# Patient Record
Sex: Male | Born: 1940
Health system: Southern US, Community
[De-identification: ages and names within clinical notes are randomized; demographics above are authoritative.]

## PROBLEM LIST (undated history)

## (undated) DIAGNOSIS — R011 Cardiac murmur, unspecified: Secondary | ICD-10-CM

## (undated) DIAGNOSIS — I351 Nonrheumatic aortic (valve) insufficiency: Secondary | ICD-10-CM

## (undated) DIAGNOSIS — Z86711 Personal history of pulmonary embolism: Secondary | ICD-10-CM

## (undated) DIAGNOSIS — I10 Essential (primary) hypertension: Secondary | ICD-10-CM

## (undated) DIAGNOSIS — I7789 Other specified disorders of arteries and arterioles: Secondary | ICD-10-CM

## (undated) DIAGNOSIS — Z9289 Personal history of other medical treatment: Secondary | ICD-10-CM

## (undated) DIAGNOSIS — M1990 Unspecified osteoarthritis, unspecified site: Secondary | ICD-10-CM

## (undated) DIAGNOSIS — E7800 Pure hypercholesterolemia, unspecified: Secondary | ICD-10-CM

## (undated) HISTORY — DX: Personal history of pulmonary embolism: Z86.711

## (undated) HISTORY — DX: Personal history of other medical treatment: Z92.89

## (undated) HISTORY — DX: Nonrheumatic aortic (valve) insufficiency: I35.1

## (undated) HISTORY — PX: MULTIPLE TOOTH EXTRACTIONS: SHX2053

## (undated) HISTORY — DX: Essential (primary) hypertension: I10

## (undated) HISTORY — DX: Other specified disorders of arteries and arterioles: I77.89

## (undated) HISTORY — PX: CARDIAC CATHETERIZATION: SHX172

## (undated) HISTORY — DX: Pure hypercholesterolemia, unspecified: E78.00

---

## 2012-12-07 ENCOUNTER — Other Ambulatory Visit (HOSPITAL_COMMUNITY): Payer: Self-pay

## 2012-12-08 ENCOUNTER — Encounter (HOSPITAL_COMMUNITY): Payer: Self-pay | Admitting: Interventional Cardiology

## 2012-12-10 ENCOUNTER — Encounter: Payer: Self-pay | Admitting: Interventional Cardiology

## 2012-12-10 ENCOUNTER — Encounter: Payer: Self-pay | Admitting: *Deleted

## 2012-12-14 ENCOUNTER — Ambulatory Visit: Payer: Self-pay | Admitting: Interventional Cardiology

## 2012-12-29 ENCOUNTER — Encounter: Payer: Self-pay | Admitting: Interventional Cardiology

## 2012-12-29 ENCOUNTER — Ambulatory Visit (INDEPENDENT_AMBULATORY_CARE_PROVIDER_SITE_OTHER): Payer: Medicare Other | Admitting: Interventional Cardiology

## 2012-12-29 VITALS — BP 138/70 | HR 75 | Ht 69.0 in | Wt 193.0 lb

## 2012-12-29 DIAGNOSIS — I7789 Other specified disorders of arteries and arterioles: Secondary | ICD-10-CM

## 2012-12-29 DIAGNOSIS — I359 Nonrheumatic aortic valve disorder, unspecified: Secondary | ICD-10-CM

## 2012-12-29 DIAGNOSIS — I10 Essential (primary) hypertension: Secondary | ICD-10-CM

## 2012-12-29 LAB — CK: Total CK: 195 U/L (ref 7–232)

## 2012-12-29 NOTE — Progress Notes (Signed)
Patient ID: Frederick Burnett, male   DOB: [birth year], 72 y.o.   MRN: [id number]    [address], [address] [place], [place]  [address] Phone: [phone] Fax:  [phone]  Date:  12/29/2012   ID:  Frederick Burnett, Frederick Burnett 07-20-1940, MRN [id number]  PCP:  No primary provider on file.   ASSESSMENT:  1. Moderate to severe aortic regurgitation, presumed secondary to bicuspid aortic valve. No heart failure symptoms 2. Aortic root enlargement secondary to aortic regurgitation. Prior measurements were not extremely increased and certainly have been less than 4. 3. Hypertension with excellent control and relatively narrow pulse pressure for significant AR  PLAN:  1. Chest CT angiogram with contrast to assess aortic root enlargement, to ensure that we are not missing a more significant problem. 2. Clinical followup in 6 months 3. If aortic root is greater than 4.5 cm we may consider repeating the echocardiogram sooner than 6 months from now in proceeding with aortic valve and root surgery.   SUBJECTIVE: Frederick Burnett is a 72 y.o. male is doing well. He does not packs himself physically but says this is been the case his entire life. He is able to do things now as he always has. He drives a school bus, he is a Arts administrator, he sayings and has no limitations within these activities. He denies palpitations and has not had syncope. He has not noted lower extremity edema, orthopnea, or PND. He denies chest pain. No episodes of syncope. Overall he feels incredibly well. He is surprised   Wt Readings from Last 3 Encounters:  12/29/12 193 lb (87.544 kg)     Past Medical History  Diagnosis Date  . Hypercholesteremia   . HTN (hypertension)   . Aortic regurgitation   . Aortic root enlargement     Current Outpatient Prescriptions  Medication Sig Dispense Refill  . hydrochlorothiazide (HYDRODIURIL) 25 MG tablet Take 25 mg by mouth daily.      . Multiple Vitamin (MULTIVITAMIN) capsule Take 1  capsule by mouth daily.      . potassium chloride (K-DUR,KLOR-CON) 10 MEQ tablet Take 10 mEq by mouth 2 (two) times daily.      . pseudoephedrine-acetaminophen (TYLENOL SINUS) 30-500 MG TABS Take 1 tablet by mouth every 4 (four) hours as needed.       No current facility-administered medications for this visit.    Allergies:   No Known Allergies  Social History:  The patient  reports that he quit smoking about 11 years ago. His smoking use included Cigars. He does not have any smokeless tobacco history on file.   ROS:  Please see the history of present illness.   No syncopal episodes. Denies chest pain.   All other systems reviewed and negative.   OBJECTIVE: VS:  BP 138/70  Pulse 75  Ht 5\' 9"  (1.753 m)  Wt 193 lb (87.544 kg)  BMI 28.49 kg/m2 Well nourished, well developed, in no acute distress, slender, younger than stated age HEENT: normal Neck: JVD none. Carotid bruit none  Cardiac:  normal S1, S2; RRR; 3-4/6 decrescendo murmur of aortic regurgitation Lungs:  clear to auscultation bilaterally, no wheezing, rhonchi or rales Abd: soft, nontender, no hepatomegaly Ext: Edema  None . Pulses 2+ and bounding  Skin: warm and dry Neuro:  CNs 2-12 intact, no focal abnormalities noted  EKG:  Prominent voltage, left atrial abnormality, left axis deviation, poor R-wave progression.       Signed, Darci Needle  III, MD 12/29/2012 11:15 AM

## 2012-12-29 NOTE — Patient Instructions (Addendum)
Labs today:Bun, Creatine  Your physician recommends that you continue on your current medications as directed. Please refer to the Current Medication list given to you today.  Non-Cardiac CT Angiography (CTA), is a special type of CT scan that uses a computer to produce multi-dimensional views of major blood vessels throughout the body. In CT angiography, a contrast material is injected through an IV to help visualize the blood vessels  Your physician wants you to follow-up in: 6 months You will receive a reminder letter in the mail two months in advance. If you don't receive a letter, please call our office to schedule the follow-up appointment.

## 2012-12-30 ENCOUNTER — Ambulatory Visit: Payer: Medicare Other

## 2012-12-30 DIAGNOSIS — I359 Nonrheumatic aortic valve disorder, unspecified: Secondary | ICD-10-CM

## 2012-12-30 LAB — CREATININE, SERUM: Creatinine, Ser: 1.1 mg/dL (ref 0.4–1.5)

## 2013-01-01 ENCOUNTER — Telehealth: Payer: Self-pay

## 2013-01-01 NOTE — Telephone Encounter (Signed)
Message copied by Jarvis Newcomer on Fri Jan 01, 2013  9:06 AM ------      Message from: Verdis Prime      Created: Thu Dec 31, 2012  3:58 PM       Normal kidney function ------

## 2013-01-01 NOTE — Telephone Encounter (Signed)
lmom Normal kidney function

## 2013-01-05 ENCOUNTER — Telehealth: Payer: Self-pay

## 2013-01-05 ENCOUNTER — Ambulatory Visit (INDEPENDENT_AMBULATORY_CARE_PROVIDER_SITE_OTHER)
Admission: RE | Admit: 2013-01-05 | Discharge: 2013-01-05 | Disposition: A | Discharge status: Home / Self Care | Payer: Medicare Other | Source: Ambulatory Visit | Attending: Interventional Cardiology | Admitting: Interventional Cardiology

## 2013-01-05 DIAGNOSIS — I7789 Other specified disorders of arteries and arterioles: Secondary | ICD-10-CM

## 2013-01-05 DIAGNOSIS — I359 Nonrheumatic aortic valve disorder, unspecified: Secondary | ICD-10-CM

## 2013-01-05 MED ORDER — IOHEXOL 350 MG/ML SOLN
100.0000 mL | Freq: Once | INTRAVENOUS | Status: AC | PRN
  Administered 2013-01-05: 100 mL via INTRAVENOUS

## 2013-01-05 NOTE — Telephone Encounter (Signed)
Message copied by Jarvis Newcomer on Tue Jan 05, 2013  2:38 PM ------      Message from: Verdis Prime      Created: Tue Jan 05, 2013  2:21 PM       Let him know that the CT shows the aorta is normal in size by echo.. ------

## 2013-01-05 NOTE — Telephone Encounter (Signed)
Follow Up ° ° ° ° ° ° ° °Pt returning Lisa's call. °

## 2013-01-05 NOTE — Telephone Encounter (Signed)
Message copied by Jarvis Newcomer on Tue Jan 05, 2013  4:12 PM ------      Message from: Verdis Prime      Created: Tue Jan 05, 2013  2:21 PM       Let him know that the CT shows the aorta is normal in size by echo.. ------

## 2013-01-05 NOTE — Telephone Encounter (Signed)
pt adv of carotid dopp results.CT shows the aorta is normal in size by echo.pt verbalized understanding

## 2013-05-26 ENCOUNTER — Ambulatory Visit (INDEPENDENT_AMBULATORY_CARE_PROVIDER_SITE_OTHER): Payer: Medicare Other | Admitting: Interventional Cardiology

## 2013-05-26 ENCOUNTER — Encounter: Payer: Self-pay | Admitting: Interventional Cardiology

## 2013-05-26 VITALS — BP 138/60 | HR 80 | Ht 69.0 in | Wt 195.8 lb

## 2013-05-26 DIAGNOSIS — I359 Nonrheumatic aortic valve disorder, unspecified: Secondary | ICD-10-CM

## 2013-05-26 DIAGNOSIS — I351 Nonrheumatic aortic (valve) insufficiency: Secondary | ICD-10-CM

## 2013-05-26 DIAGNOSIS — I7789 Other specified disorders of arteries and arterioles: Secondary | ICD-10-CM

## 2013-05-26 DIAGNOSIS — I10 Essential (primary) hypertension: Secondary | ICD-10-CM

## 2013-05-26 NOTE — Patient Instructions (Addendum)
Your physician recommends that you continue on your current medications as directed. Please refer to the Current Medication list given to you today.  Your physician has requested that you have an echocardiogram. Echocardiography is a painless test that uses sound waves to create images of your heart. It provides your doctor with information about the size and shape of your heart and how well your heart's chambers and valves are working. This procedure takes approximately one hour. There are no restrictions for this procedure.(To be schedule early Oct 2015)   Your physician wants you to follow-up in: 6 months  You will receive a reminder letter in the mail two months in advance. If you don't receive a letter, please call our office to schedule the follow-up appointment.

## 2013-05-26 NOTE — Progress Notes (Signed)
Patient ID: Frederick Burnett, male   DOB: 09/28/1940, 73 y.o.   MRN: [id number]    [address], [address] [place], [place]  [address] Phone: [phone] Fax:  [phone]  Date:  05/26/2013   ID:  Frederick Burnett, DOB 1940-04-21, MRN [id number]  PCP:  Donnie Coffin, MD   ASSESSMENT:  1. Significant aortic regurgitation, clinically asymptomatic 2. Stable aortic root enlargement 3. Hypertension  PLAN:  1. 2-D Doppler echocardiogram in 6 months 2. Clinical followup in 6 months   SUBJECTIVE: Frederick Burnett is a 73 y.o. male who has no cardiovascular symptoms. He continues to do as he has previously. He denies orthopnea, PND, palpitations, syncope, and edema. No chest pain.   Wt Readings from Last 3 Encounters:  05/26/13 195 lb 12.8 oz (88.814 kg)  12/29/12 193 lb (87.544 kg)     Past Medical History  Diagnosis Date  . Hypercholesteremia   . HTN (hypertension)   . Aortic regurgitation   . Aortic root enlargement     Current Outpatient Prescriptions  Medication Sig Dispense Refill  . hydrochlorothiazide (HYDRODIURIL) 25 MG tablet Take 25 mg by mouth daily.      . Multiple Vitamin (MULTIVITAMIN) capsule Take 1 capsule by mouth daily.      . potassium chloride (K-DUR,KLOR-CON) 10 MEQ tablet Take 10 mEq by mouth daily.       . pseudoephedrine-acetaminophen (TYLENOL SINUS) 30-500 MG TABS Take 1 tablet by mouth every 4 (four) hours as needed.       No current facility-administered medications for this visit.    Allergies:   No Known Allergies  Social History:  The patient  reports that he quit smoking about 12 years ago. His smoking use included Cigars. He does not have any smokeless tobacco history on file.   ROS:  Please see the history of present illness.   No chills or fever   All other systems reviewed and negative.   OBJECTIVE: VS:  BP 138/60  Pulse 80  Ht 5\' 9"  (1.753 m)  Wt 195 lb 12.8 oz (88.814 kg)  BMI 28.90 kg/m2 Well nourished, well developed, in no  acute distress, appears younger than stated age 13: normal Neck: JVD flat while lying at 46. Carotid bruit without bruits  Cardiac:  normal S1, S2; RRR; 2/6 systolic murmur right upper sternal border and 3/6 decrescendo murmur of aortic regurgitation, unchanged from prior evaluation. No gallop is noted. Lungs:  clear to auscultation bilaterally, no wheezing, rhonchi or rales Abd: soft, nontender, no hepatomegaly Ext: Edema absent. Pulses 2+ and symmetric Skin: warm and dry Neuro:  CNs 2-12 intact, no focal abnormalities noted  EKG:  Not repeated       Signed, Illene Labrador III, MD 05/26/2013 10:40 AM

## 2013-11-25 ENCOUNTER — Ambulatory Visit (HOSPITAL_COMMUNITY): Payer: Medicare Other | Attending: Cardiology | Admitting: Radiology

## 2013-11-25 DIAGNOSIS — I77819 Aortic ectasia, unspecified site: Secondary | ICD-10-CM | POA: Diagnosis not present

## 2013-11-25 DIAGNOSIS — I351 Nonrheumatic aortic (valve) insufficiency: Secondary | ICD-10-CM | POA: Insufficient documentation

## 2013-11-25 DIAGNOSIS — I10 Essential (primary) hypertension: Secondary | ICD-10-CM

## 2013-11-25 DIAGNOSIS — I7789 Other specified disorders of arteries and arterioles: Secondary | ICD-10-CM

## 2013-11-25 NOTE — Progress Notes (Signed)
Echocardiogram performed.  

## 2013-11-29 ENCOUNTER — Telehealth: Payer: Self-pay

## 2013-11-29 NOTE — Telephone Encounter (Signed)
Message copied by Lamar Laundry on Mon Nov 29, 2013  1:50 PM ------      Message from: Daneen Schick      Created: Fri Nov 26, 2013  7:21 PM       The heart is getting larger and overall pumping function is decreasing. Aortic regurgitation is severe. He needs to be set up for left and right heart catheterization with aortic root angiography. ------

## 2013-11-29 NOTE — Telephone Encounter (Signed)
called to give pt echo results.lmtcb 

## 2013-11-30 NOTE — Telephone Encounter (Signed)
Follow up     Returning a call from Frederick Burnett

## 2013-11-30 NOTE — Telephone Encounter (Signed)
attempted to call pt with echo results and recommendations.pt phone rings out. will try again later.

## 2013-11-30 NOTE — Telephone Encounter (Signed)
Message copied by Lamar Laundry on Tue Nov 30, 2013 11:47 AM ------      Message from: Daneen Schick      Created: Fri Nov 26, 2013  7:21 PM       The heart is getting larger and overall pumping function is decreasing. Aortic regurgitation is severe. He needs to be set up for left and right heart catheterization with aortic root angiography. ------

## 2013-11-30 NOTE — Telephone Encounter (Signed)
  Pt aware of echo results and Dr.Smith recommendations.The heart is getting larger and overall pumping function is decreasing. Aortic regurgitation is severe. He needs to be set up for left and right heart catheterization with aortic root angiography.pt rqst to talk with Dr.Smith before Proceeding. Pt adv I will fwd Dr.Smith a message to call him. Pt verbalized understanding.

## 2013-12-02 ENCOUNTER — Encounter: Payer: Self-pay | Admitting: Interventional Cardiology

## 2013-12-02 ENCOUNTER — Ambulatory Visit (INDEPENDENT_AMBULATORY_CARE_PROVIDER_SITE_OTHER): Payer: Medicare Other | Admitting: Interventional Cardiology

## 2013-12-02 VITALS — BP 134/68 | HR 76 | Ht 69.0 in | Wt 196.0 lb

## 2013-12-02 DIAGNOSIS — I359 Nonrheumatic aortic valve disorder, unspecified: Secondary | ICD-10-CM

## 2013-12-02 LAB — CBC WITH DIFFERENTIAL/PLATELET
Basophils Absolute: 0 10*3/uL (ref 0.0–0.1)
Basophils Relative: 0.5 % (ref 0.0–3.0)
EOS PCT: 4.1 % (ref 0.0–5.0)
Eosinophils Absolute: 0.2 10*3/uL (ref 0.0–0.7)
HCT: 43 % (ref 39.0–52.0)
Hemoglobin: 14.1 g/dL (ref 13.0–17.0)
Lymphocytes Relative: 34.3 % (ref 12.0–46.0)
Lymphs Abs: 1.6 10*3/uL (ref 0.7–4.0)
MCHC: 32.8 g/dL (ref 30.0–36.0)
MCV: 86.5 fl (ref 78.0–100.0)
MONO ABS: 0.3 10*3/uL (ref 0.1–1.0)
Monocytes Relative: 7.3 % (ref 3.0–12.0)
NEUTROS PCT: 53.8 % (ref 43.0–77.0)
Neutro Abs: 2.5 10*3/uL (ref 1.4–7.7)
PLATELETS: 148 10*3/uL — AB (ref 150.0–400.0)
RBC: 4.97 Mil/uL (ref 4.22–5.81)
RDW: 13.8 % (ref 11.5–15.5)
WBC: 4.6 10*3/uL (ref 4.0–10.5)

## 2013-12-02 LAB — BASIC METABOLIC PANEL
BUN: 12 mg/dL (ref 6–23)
CALCIUM: 9.1 mg/dL (ref 8.4–10.5)
CO2: 27 mEq/L (ref 19–32)
CREATININE: 1.1 mg/dL (ref 0.4–1.5)
Chloride: 106 mEq/L (ref 96–112)
GFR: 82.53 mL/min (ref >60.00)
Glucose, Bld: 90 mg/dL (ref 70–99)
Potassium: 3.6 mEq/L (ref 3.5–5.1)
SODIUM: 138 meq/L (ref 135–145)

## 2013-12-02 LAB — PROTIME-INR
INR: 1 ratio (ref 0.8–1.0)
PROTHROMBIN TIME: 11.1 s (ref 9.6–13.1)

## 2013-12-02 NOTE — Patient Instructions (Signed)
Your physician recommends that you continue on your current medications as directed. Please refer to the Current Medication list given to you today.  Lab Today: Bmet,Cbc, Pt/Inr  Your physician has requested that you have a cardiac catheterization. Cardiac catheterization is used to diagnose and/or treat various heart conditions. Doctors may recommend this procedure for a number of different reasons. The most common reason is to evaluate chest pain. Chest pain can be a symptom of coronary artery disease (CAD), and cardiac catheterization can show whether plaque is narrowing or blocking your heart's arteries. This procedure is also used to evaluate the valves, as well as measure the blood flow and oxygen levels in different parts of your heart. For further information please visit www.cardiosmart.org. Please follow instruction sheet, as given.   

## 2013-12-02 NOTE — Progress Notes (Signed)
Patient ID: Frederick Burnett, male   DOB: [birth year], 73 y.o.   MRN: [id number]   Date: 12/02/2013 ID: Frederick Burnett, DOB [birth year], MRN [id number] PCP: Donnie Coffin, MD  Reason: Aortic regurgitation  ASSESSMENT;  1. Significant aortic regurgitation with gradual increasing LV size and decreased function 2. Hypertension 3. Aortic enlargement  PLAN:  1. Left and right heart catheterization with coronary angiography to define hemodynamics associated with the valvular lesion and to rule out coronary artery disease. 2. Referral to cardiac surgeon to consider aortic valve and root replacement after data is obtained 3. The catheterization procedure was discussed in detail including the risks of stroke, death, myocardial infarction, bleeding, limb ischemia, kidney injury, and allergy to contrast. The patient understands these risks and is willing to proceed.  SUBJECTIVE: Frederick Burnett is a 73 y.o. male who has been followed for aortic regurgitation for several years. He voices no particular complaints. They an oval between the echocardiogram in 2014 of this year demonstrates a decline in LV function with an increase in LV size. Despite this the patient is as physically active as ever. He denies orthopnea, PND, and edema. He has not had syncope or palpitations. He denies chest discomfort. There is no exertional fatigue.   No Known Allergies  Current Outpatient Prescriptions on File Prior to Visit  Medication Sig Dispense Refill  . hydrochlorothiazide (HYDRODIURIL) 25 MG tablet Take 25 mg by mouth daily.      . Multiple Vitamin (MULTIVITAMIN) capsule Take 1 capsule by mouth daily.      . potassium chloride (K-DUR,KLOR-CON) 10 MEQ tablet Take 10 mEq by mouth daily.        No current facility-administered medications on file prior to visit.    Past Medical History  Diagnosis Date  . Hypercholesteremia   . HTN (hypertension)   . Aortic regurgitation   . Aortic root enlargement     Past  Surgical History  Procedure Laterality Date  . Multiple tooth extractions      History   Social History  . Marital Status: Married    Spouse Name: N/A    Number of Children: N/A  . Years of Education: N/A   Occupational History  . Not on file.   Social History Main Topics  . Smoking status: Former Smoker    Types: Cigars    Quit date: 05/29/2001  . Smokeless tobacco: Not on file  . Alcohol Use: Not on file  . Drug Use: Not on file  . Sexual Activity: Not on file   Other Topics Concern  . Not on file   Social History Narrative  . No narrative on file    Family History  Problem Relation Age of Onset  . Hypertension Mother     ROS: Appetite is been stable. Denies edema. No transient neurological symptoms. No abdominal distention or pain.. Other systems negative for complaints.  OBJECTIVE: BP 134/68  Pulse 76  Ht 5\' 9"  (1.753 m)  Wt 196 lb (88.905 kg)  BMI 28.93 kg/m2  SpO2 99%,  General: No acute distress, who appears than his stated age 89: normal there is no jaundice or pallor Neck: JVD mild elevation lying at 30. Carotids brisk, hyperdynamic upstroke Chest: Clear to auscultation and percussion Cardiac: Murmur: 3/6 decrescendo holodiastolic aortic regurgitation murmur. Gallop: S4. Rhythm: Regular. Other: Absent Abdomen: Bruit: Absent. Pulsation: None Extremities: Edema: Absent. Pulses: 2+ and symmetric Neuro: Normal Psych: Normal  ECG: None

## 2013-12-02 NOTE — Telephone Encounter (Signed)
Addressed at o/v today with Dr.Smith pt ok with proceeding with cardiac cath

## 2013-12-07 NOTE — Telephone Encounter (Signed)
called to adv pt of scheduled cardiac cath.lmtcb

## 2013-12-08 NOTE — Telephone Encounter (Signed)
returned pt call. lmtcb 

## 2013-12-08 NOTE — Telephone Encounter (Signed)
Follow Up   Pt returned call to sch Cath

## 2013-12-08 NOTE — Telephone Encounter (Signed)
2nd attempt today.lmtcb

## 2013-12-09 NOTE — Telephone Encounter (Signed)
Pt contacted with schedule cardiac cath date and time 10/21 @ 10am. Pt sts that date and time does not work for him. He will call back to reschedule cath

## 2013-12-15 ENCOUNTER — Ambulatory Visit (HOSPITAL_COMMUNITY)
Admission: RE | Admit: 2013-12-15 | Payer: Medicare Other | Source: Ambulatory Visit | Admitting: Interventional Cardiology

## 2013-12-15 ENCOUNTER — Encounter (HOSPITAL_COMMUNITY): Admission: RE | Payer: Self-pay | Source: Ambulatory Visit

## 2013-12-15 SURGERY — LEFT AND RIGHT HEART CATHETERIZATION WITH CORONARY ANGIOGRAM
Anesthesia: LOCAL

## 2013-12-16 NOTE — Telephone Encounter (Signed)
spoke with pt who wants to schedule his cardiac cath when he is on Winter break form work Dec 18-Jan 4.pt wants to know if Dr.Smith would adv he wait till then.pt sts that he feels well with no complaints. Adv him I will fwd a message to Dr.Smith and call back with his recommendations. Pt verbalized understanding.

## 2013-12-17 NOTE — Telephone Encounter (Signed)
per pt rqst to leave a detailed message.per Dr.Smith.It is okay for Frederick Burnett to have a catheterization performed. He is on winter break.

## 2013-12-17 NOTE — Telephone Encounter (Signed)
It is okay for Frederick Burnett to have a catheterization performed. He is on winter break

## 2014-02-01 ENCOUNTER — Telehealth: Payer: Self-pay | Admitting: Interventional Cardiology

## 2014-02-01 NOTE — Telephone Encounter (Signed)
Returned pt call. Pt is to be scheduled for a cardiac cath with Dr.Smith during pt winter break form work Dec.18-Jan 4.pt is a Recruitment consultant. Adv pt that Dr.Smith first date back in the cath lab in 12/29.adv pt that I will work on getting him schedule and call him back when I am back in the office on 12/10.pt agreeable and verbalized understanding.

## 2014-02-01 NOTE — Telephone Encounter (Signed)
New message         Pt would like lisa to give him a call

## 2014-02-03 ENCOUNTER — Telehealth: Payer: Self-pay | Admitting: Interventional Cardiology

## 2014-02-03 DIAGNOSIS — Z01812 Encounter for preprocedural laboratory examination: Secondary | ICD-10-CM

## 2014-02-03 DIAGNOSIS — I359 Nonrheumatic aortic valve disorder, unspecified: Secondary | ICD-10-CM

## 2014-02-03 NOTE — Telephone Encounter (Signed)
New Msg   Pt returning call, please contact at [phone].

## 2014-02-03 NOTE — Telephone Encounter (Signed)
called to give pt info on cardiac cath scheduled.lmtcb

## 2014-02-03 NOTE — Telephone Encounter (Signed)
Returned pt call. Pt aware of cardiac cath scheduled on 02/22/14 @ 7:30am with Dr.Smith. Pt given verbal pre procedure instructions. Written instructions will be mailed. Pt will come to the office on 12/23 for labs. Pt agreeable and verbalized understanding.

## 2014-02-11 ENCOUNTER — Other Ambulatory Visit: Payer: Self-pay | Admitting: Interventional Cardiology

## 2014-02-11 DIAGNOSIS — I351 Nonrheumatic aortic (valve) insufficiency: Secondary | ICD-10-CM

## 2014-02-16 ENCOUNTER — Other Ambulatory Visit (INDEPENDENT_AMBULATORY_CARE_PROVIDER_SITE_OTHER): Payer: Medicare Other | Admitting: *Deleted

## 2014-02-16 DIAGNOSIS — I359 Nonrheumatic aortic valve disorder, unspecified: Secondary | ICD-10-CM

## 2014-02-16 DIAGNOSIS — Z01812 Encounter for preprocedural laboratory examination: Secondary | ICD-10-CM

## 2014-02-16 LAB — CBC WITH DIFFERENTIAL/PLATELET
BASOS ABS: 0 10*3/uL (ref 0.0–0.1)
Basophils Relative: 0.6 % (ref 0.0–3.0)
EOS ABS: 0.3 10*3/uL (ref 0.0–0.7)
Eosinophils Relative: 6.3 % — ABNORMAL HIGH (ref 0.0–5.0)
HEMATOCRIT: 42.9 % (ref 39.0–52.0)
HEMOGLOBIN: 13.9 g/dL (ref 13.0–17.0)
LYMPHS ABS: 1.4 10*3/uL (ref 0.7–4.0)
Lymphocytes Relative: 26.3 % (ref 12.0–46.0)
MCHC: 32.3 g/dL (ref 30.0–36.0)
MCV: 86.3 fl (ref 78.0–100.0)
MONO ABS: 0.5 10*3/uL (ref 0.1–1.0)
Monocytes Relative: 9.4 % (ref 3.0–12.0)
NEUTROS ABS: 3.2 10*3/uL (ref 1.4–7.7)
Neutrophils Relative %: 57.4 % (ref 43.0–77.0)
PLATELETS: 147 10*3/uL — AB (ref 150.0–400.0)
RBC: 4.97 Mil/uL (ref 4.22–5.81)
RDW: 13.7 % (ref 11.5–15.5)
WBC: 5.5 10*3/uL (ref 4.0–10.5)

## 2014-02-16 LAB — BASIC METABOLIC PANEL
BUN: 11 mg/dL (ref 6–23)
CO2: 25 meq/L (ref 19–32)
Calcium: 8.9 mg/dL (ref 8.4–10.5)
Chloride: 109 mEq/L (ref 96–112)
Creatinine, Ser: 1.2 mg/dL (ref 0.4–1.5)
GFR: 75.44 mL/min (ref >60.00)
GLUCOSE: 87 mg/dL (ref 70–99)
POTASSIUM: 3.5 meq/L (ref 3.5–5.1)
SODIUM: 141 meq/L (ref 135–145)

## 2014-02-22 ENCOUNTER — Ambulatory Visit (HOSPITAL_COMMUNITY)
Admission: RE | Admit: 2014-02-22 | Discharge: 2014-02-22 | Disposition: A | Discharge status: Home / Self Care | Payer: Medicare Other | Source: Ambulatory Visit | Attending: Interventional Cardiology | Admitting: Interventional Cardiology

## 2014-02-22 ENCOUNTER — Encounter (HOSPITAL_COMMUNITY): Payer: Self-pay | Admitting: Interventional Cardiology

## 2014-02-22 ENCOUNTER — Encounter (HOSPITAL_COMMUNITY)
Admission: RE | Disposition: A | Discharge status: Home / Self Care | Payer: Self-pay | Source: Ambulatory Visit | Attending: Interventional Cardiology

## 2014-02-22 DIAGNOSIS — I10 Essential (primary) hypertension: Secondary | ICD-10-CM | POA: Insufficient documentation

## 2014-02-22 DIAGNOSIS — E780 Pure hypercholesterolemia: Secondary | ICD-10-CM | POA: Diagnosis not present

## 2014-02-22 DIAGNOSIS — I340 Nonrheumatic mitral (valve) insufficiency: Secondary | ICD-10-CM | POA: Insufficient documentation

## 2014-02-22 DIAGNOSIS — I351 Nonrheumatic aortic (valve) insufficiency: Secondary | ICD-10-CM | POA: Insufficient documentation

## 2014-02-22 DIAGNOSIS — Z87891 Personal history of nicotine dependence: Secondary | ICD-10-CM | POA: Insufficient documentation

## 2014-02-22 DIAGNOSIS — I440 Atrioventricular block, first degree: Secondary | ICD-10-CM | POA: Insufficient documentation

## 2014-02-22 DIAGNOSIS — I359 Nonrheumatic aortic valve disorder, unspecified: Secondary | ICD-10-CM | POA: Diagnosis present

## 2014-02-22 DIAGNOSIS — I7789 Other specified disorders of arteries and arterioles: Secondary | ICD-10-CM | POA: Diagnosis present

## 2014-02-22 HISTORY — PX: LEFT AND RIGHT HEART CATHETERIZATION WITH CORONARY ANGIOGRAM: SHX5449

## 2014-02-22 LAB — POCT I-STAT 3, VENOUS BLOOD GAS (G3P V)
ACID-BASE DEFICIT: 1 mmol/L (ref 0.0–2.0)
Bicarbonate: 25.2 mEq/L — ABNORMAL HIGH (ref 20.0–24.0)
O2 Saturation: 74 %
TCO2: 27 mmol/L (ref 0–100)
pCO2, Ven: 45.6 mmHg (ref 45.0–50.0)
pH, Ven: 7.35 — ABNORMAL HIGH (ref 7.250–7.300)
pO2, Ven: 42 mmHg (ref 30.0–45.0)

## 2014-02-22 LAB — PROTIME-INR
INR: 1.04 (ref 0.00–1.49)
PROTHROMBIN TIME: 13.7 s (ref 11.6–15.2)

## 2014-02-22 LAB — POCT I-STAT 3, ART BLOOD GAS (G3+)
ACID-BASE DEFICIT: 3 mmol/L — AB (ref 0.0–2.0)
Bicarbonate: 23.1 mEq/L (ref 20.0–24.0)
O2 SAT: 96 %
TCO2: 24 mmol/L (ref 0–100)
pCO2 arterial: 43.4 mmHg (ref 35.0–45.0)
pH, Arterial: 7.334 — ABNORMAL LOW (ref 7.350–7.450)
pO2, Arterial: 88 mmHg (ref 80.0–100.0)

## 2014-02-22 SURGERY — LEFT AND RIGHT HEART CATHETERIZATION WITH CORONARY ANGIOGRAM
Anesthesia: LOCAL

## 2014-02-22 MED ORDER — NITROGLYCERIN 1 MG/10 ML FOR IR/CATH LAB
INTRA_ARTERIAL | Status: AC
  Filled 2014-02-22: qty 10

## 2014-02-22 MED ORDER — ASPIRIN 81 MG PO CHEW
81.0000 mg | CHEWABLE_TABLET | ORAL | Status: AC
  Administered 2014-02-22: 81 mg via ORAL

## 2014-02-22 MED ORDER — LIDOCAINE HCL (PF) 1 % IJ SOLN
INTRAMUSCULAR | Status: AC
  Filled 2014-02-22: qty 30

## 2014-02-22 MED ORDER — SODIUM CHLORIDE 0.9 % IV SOLN: 250.0000 mL | INTRAVENOUS | Status: DC | PRN

## 2014-02-22 MED ORDER — SODIUM CHLORIDE 0.9 % IV SOLN: INTRAVENOUS | Status: AC

## 2014-02-22 MED ORDER — FENTANYL CITRATE 0.05 MG/ML IJ SOLN
INTRAMUSCULAR | Status: AC
  Filled 2014-02-22: qty 2

## 2014-02-22 MED ORDER — SODIUM CHLORIDE 0.9 % IJ SOLN
3.0000 mL | Freq: Two times a day (BID) | INTRAMUSCULAR | Status: DC
  Administered 2014-02-22: 3 mL via INTRAVENOUS

## 2014-02-22 MED ORDER — SODIUM CHLORIDE 0.9 % IJ SOLN: 3.0000 mL | INTRAMUSCULAR | Status: DC | PRN

## 2014-02-22 MED ORDER — ACETAMINOPHEN 325 MG PO TABS: 650.0000 mg | ORAL_TABLET | ORAL | Status: DC | PRN

## 2014-02-22 MED ORDER — MIDAZOLAM HCL 2 MG/2ML IJ SOLN
INTRAMUSCULAR | Status: AC
  Filled 2014-02-22: qty 2

## 2014-02-22 MED ORDER — HEPARIN (PORCINE) IN NACL 2-0.9 UNIT/ML-% IJ SOLN
INTRAMUSCULAR | Status: AC
  Filled 2014-02-22: qty 1000

## 2014-02-22 MED ORDER — ONDANSETRON HCL 4 MG/2ML IJ SOLN: 4.0000 mg | Freq: Four times a day (QID) | INTRAMUSCULAR | Status: DC | PRN

## 2014-02-22 MED ORDER — SODIUM CHLORIDE 0.9 % IV SOLN
INTRAVENOUS | Status: DC
  Administered 2014-02-22: 06:00:00 via INTRAVENOUS

## 2014-02-22 NOTE — Discharge Instructions (Signed)
Angiogram, Care After ° °Refer to this sheet in the next few weeks. These instructions provide you with information on caring for yourself after your procedure. Your health care provider may also give you more specific instructions. Your treatment has been planned according to current medical practices, but problems sometimes occur. Call your health care provider if you have any problems or questions after your procedure.  °WHAT TO EXPECT AFTER THE PROCEDURE °After your procedure, it is typical to have the following sensations: °· Minor discomfort or tenderness and a small bump at the catheter insertion site. The bump should usually decrease in size and tenderness within 1 to 2 weeks. °· Any bruising will usually fade within 2 to 4 weeks. °HOME CARE INSTRUCTIONS  °· You may need to keep taking blood thinners if they were prescribed for you. Take medicines only as directed by your health care provider. °· Do not apply powder or lotion to the site. °· Do not take baths, swim, or use a hot tub until your health care provider approves. °· You may shower 24 hours after the procedure. Remove the bandage (dressing) and gently wash the site with plain soap and water. Gently pat the site dry. °· Inspect the site at least twice daily. °· Limit your activity for the first 24 hours. Do not bend, squat, or lift anything over 10 lb (9 kg) or as directed by your health care provider. °· Plan to have someone take you home after the procedure. Follow instructions about when you can drive or return to work. °SEEK MEDICAL CARE IF: °· You get light-headed when standing up. °· You have drainage (other than a small amount of blood on the dressing). °· You have chills. °· You have a fever. °· You have redness, warmth, swelling, or pain at the insertion site. °SEEK IMMEDIATE MEDICAL CARE IF:  °· You develop chest pain or shortness of breath, feel faint, or pass out. °· You have bleeding, swelling larger than a walnut, or drainage from the  catheter insertion site. °· You develop pain, discoloration, coldness, or severe bruising in the leg or arm that held the catheter. °· You have heavy bleeding from the site. If this happens, hold pressure on the site. °MAKE SURE YOU: °· Understand these instructions. °· Will watch your condition. °· Will get help right away if you are not doing well or get worse. °Document Released: 08/30/2004 Document Revised: 06/28/2013 Document Reviewed: 07/06/2012 °ExitCare® Patient Information ©2015 [employer]. This information is not intended to replace advice given to you by your health care provider. Make sure you discuss any questions you have with your health care provider. ° °

## 2014-02-22 NOTE — H&P (Signed)
     Frederick Burnett is a 73 y.o. male  Admit Date: 02/22/2014 Referring Physician: Donnie Coffin Primary Cardiologist:: HWB Tamala Julian, MD Chief complaint / reason for admission: Aortic regurgitation  HPI: The patient is 73 yo and has aortic regurgitation with recent echo showing increased LV cavity size and decreased EF compared with prior. He is asymptomatic. He denies orthopnea, PND, chest pain, syncope, and edema.    PMH:    Past Medical History  Diagnosis Date  . Hypercholesteremia   . HTN (hypertension)   . Aortic regurgitation   . Aortic root enlargement     PSH:    Past Surgical History  Procedure Laterality Date  . Multiple tooth extractions     ALLERGIES:   Review of patient's allergies indicates no known allergies. Prior to Admit Meds:   Prescriptions prior to admission  Medication Sig Dispense Refill Last Dose  . hydrochlorothiazide (HYDRODIURIL) 25 MG tablet Take 25 mg by mouth daily.   02/22/2014 at 0500  . Multiple Vitamin (MULTIVITAMIN) capsule Take 1 capsule by mouth daily.   02/22/2014 at 0500  . potassium chloride (K-DUR,KLOR-CON) 10 MEQ tablet Take 10 mEq by mouth daily.    02/22/2014 at 0500   Family HX:    Family History  Problem Relation Age of Onset  . Hypertension Mother    Social HX:    History   Social History  . Marital Status: Married    Spouse Name: N/A    Number of Children: N/A  . Years of Education: N/A   Occupational History  . Not on file.   Social History Main Topics  . Smoking status: Former Smoker    Types: Cigars    Quit date: 05/29/2001  . Smokeless tobacco: Not on file  . Alcohol Use: Not on file  . Drug Use: Not on file  . Sexual Activity: Not on file   Other Topics Concern  . Not on file   Social History Narrative  . No narrative on file     ROS  Denies chills and fever. Appetite is stable.  Physical Exam: Blood pressure 141/59, pulse 83, temperature 97.9 F (36.6 C), temperature source Oral, resp. rate  18, height 5\' 9"  (1.753 m), weight 186 lb (84.369 kg), SpO2 100 %.    Skin is clear HEENT is unremarkable Neck with no JVD or bruits Chest is clear Cardiac reveals 3/6 decrescendo diastolic AR murmur. No gallop or rub. 3/6 systolic murmur is heard. Abdomen is soft with no bruit or tenderness. Extremities without edema Neuro is normal..  Labs: Lab Results  Component Value Date   WBC 5.5 02/16/2014   HGB 13.9 02/16/2014   HCT 42.9 02/16/2014   MCV 86.3 02/16/2014   PLT 147.0* 02/16/2014    Recent Labs Lab 02/16/14 0931  NA 141  K 3.5  CL 109  CO2 25  BUN 11  CREATININE 1.2  CALCIUM 8.9  GLUCOSE 87   Lab Results  Component Value Date   CKTOTAL 195 12/29/2012     Radiology:  No results found.  EKG:  NSR with LVH  ASSESSMENT: 1. Severe AR with LVE and decreased LV systolic function 2. Hypertension 3. Dilated aortic root.  Plan:  1. Left and right heart cath with coronary angiography prior to referral for AVR and possible aortic root repair. Procedure and risk of stroke, death, MI, bleeding, kidney injury, allergy,  etc. Discussed and accepted by patient. Sinclair Grooms 02/22/2014 7:26 AM

## 2014-02-22 NOTE — CV Procedure (Signed)
     Left and Right Heart Catheterization with Coronary Angiography Report  Frederick Burnett  73 y.o.  male Nov 30, 1940  Procedure Date: 02/22/2014 Referring Physician: Donnie Coffin, M.D. Primary Cardiologist:: H WB Blenda Bridegroom, M.D.  INDICATIONS: Severe aortic regurgitation with progressive left ventricular enlargement and decrease portion year over a year.  PROCEDURE: 1. Left heart cath; 2. Right heart cath; 3. Left ventriculography; 4. Aortography; 5. Coronary angiography  CONSENT:  The risks, benefits, and details of the procedure were explained in detail to the patient. Risks including death, stroke, heart attack, kidney injury, allergy, limb ischemia, bleeding and radiation injury were discussed.  The patient verbalized understanding and wanted to proceed.  Informed written consent was obtained.  PROCEDURE TECHNIQUE:  After Xylocaine anesthesia a 5 French sheath was placed in the right femoral artery and a 7 French sheath in the right femoral vein both using the modified Seldinger technique. 2% Xylocaine was used for local anesthesia. Coronary angiography was done using a 5 F A2 multipurpose catheter.  Left ventriculography was done using a straight 5 French pigtail catheter and aortography was performed using the same catheter with power injection.   Hemostasis was achieved by manual compression   CONTRAST:  Total of 125 cc.  COMPLICATIONS:  None   HEMODYNAMICS:  Aortic pressure 101/53 mmHg; LV pressure 106 /1 mmHg; LVEDP 7 mmHg; RA 4 mmHg mean; RV 24 /6 mmHg; PA 25/11 mmHg; PCWP(mean) 12 mmHg with V-wave to 19 mmHg; Cardiac Output 6.35 L/m; AV gradient none  ANGIOGRAPHIC DATA:   The left main coronary artery is widely patent.  The left anterior descending artery is widely patent but reveals proximal and mid luminal irregularities.  The left circumflex artery is widely patent and contains 50% mid vessel stenosis after the first obtuse marginal branch. Beyond this region 2  moderate-sized diagonals arise..  The right coronary artery is patent. There are proximal and mid irregularities. The vessel is dominant.  SUPRAVALVULAR AORTIC ANGIOGRAPHY: Severe aortic regurgitation with aortic root enlargement  LEFT VENTRICULOGRAM:  Left ventricular angiogram was done in the 30 RAO projection and revealed a dilated left ventricle, global hypokinesis with an estimated EF of 35-40%. Moderate mitral regurgitation is noted.   IMPRESSIONS:  1. Severe aortic regurgitation with moderate aortic root enlargement 2. Moderate mitral regurgitation, hopefully related to LV enlargement and not primary valvular abnormality 3. Global left ventricular hypokinesis with an estimated ejection fraction of 35-40% 4. Widely patent coronary arteries 5. Normal pulmonary pressures and normal left heart filling pressures.   RECOMMENDATION:  Patient will need to be set up for a transesophageal echo The patient will be referred to Dr. Gilford Raid.

## 2014-02-22 NOTE — Progress Notes (Addendum)
Site area: rt groin Site Prior to Removal:  Level 0 Pressure Applied For: sheaths removed by Lexi Potter,RN and DYoung,RN, pressure held for 20 minutes Manual:   yes Patient Status During Pull:  stable Post Pull Site:  Level 0 Post Pull Instructions Given:  yes Post Pull Pulses Present: yes Dressing Applied:  tegaderm Bedrest begins @ 2574 Comments: no complications

## 2014-03-16 ENCOUNTER — Telehealth: Payer: Self-pay | Admitting: Interventional Cardiology

## 2014-03-16 DIAGNOSIS — I351 Nonrheumatic aortic (valve) insufficiency: Secondary | ICD-10-CM

## 2014-03-16 NOTE — Telephone Encounter (Signed)
Left message for patient to call back. Patient scheduled a TEE test with Dr. Meda Coffee on 03/24/14 at 9:00 am. Patient needs instructions and when to be at the hospital for procedure, which he should be there at 7:30 am. Will mail him a letter. Also sent [facility] a message to schedule patient an appointment with Dr. Cyndia Bent.

## 2014-03-16 NOTE — Telephone Encounter (Signed)
New Message   Pt requested to talk about his surgery. Please call back and discuss.

## 2014-03-16 NOTE — Telephone Encounter (Signed)
Called patient back. Patient wants to know what he is suppose to do next. Patient wants to know results of  heart catheterization. Patient does not remember anyone telling him anything after his procedure. Informed patient that message will be forward to Dr. Tamala Julian for instructions. According to notes, Dr. Tamala Julian wanted patient to be set up for a transesophageal echo with Dr. Gilford Raid.

## 2014-03-16 NOTE — Telephone Encounter (Signed)
He was supposed to have TEE to evaluate mitral valve and then see Dr. Cyndia Bent. This was to be done by Trish. Must have been missed.   Please arrange for TEE and then OV with Dr.Bartle ASAP.

## 2014-03-17 NOTE — Telephone Encounter (Signed)
I spoke with the pt and gave him pre-procedure instructions for TEE on 03/24/14.  I also made him aware of appt with Dr Cyndia Bent on 03/30/14. The pt had a number of questions in reference to why he needs this procedure.  I answered all of the pt's questions and he was appreciative of the phone call.

## 2014-03-24 ENCOUNTER — Encounter (HOSPITAL_COMMUNITY): Payer: Self-pay | Admitting: *Deleted

## 2014-03-24 ENCOUNTER — Ambulatory Visit (HOSPITAL_COMMUNITY)
Admission: RE | Admit: 2014-03-24 | Discharge: 2014-03-24 | Disposition: A | Discharge status: Home / Self Care | Payer: Medicare Other | Source: Ambulatory Visit | Attending: Interventional Cardiology | Admitting: Interventional Cardiology

## 2014-03-24 ENCOUNTER — Encounter (HOSPITAL_COMMUNITY)
Admission: RE | Disposition: A | Discharge status: Home / Self Care | Payer: Self-pay | Source: Ambulatory Visit | Attending: Interventional Cardiology

## 2014-03-24 DIAGNOSIS — I358 Other nonrheumatic aortic valve disorders: Secondary | ICD-10-CM | POA: Diagnosis not present

## 2014-03-24 DIAGNOSIS — I351 Nonrheumatic aortic (valve) insufficiency: Secondary | ICD-10-CM | POA: Insufficient documentation

## 2014-03-24 DIAGNOSIS — Z79899 Other long term (current) drug therapy: Secondary | ICD-10-CM | POA: Diagnosis not present

## 2014-03-24 DIAGNOSIS — E780 Pure hypercholesterolemia: Secondary | ICD-10-CM | POA: Diagnosis not present

## 2014-03-24 DIAGNOSIS — I10 Essential (primary) hypertension: Secondary | ICD-10-CM | POA: Diagnosis not present

## 2014-03-24 DIAGNOSIS — Z87891 Personal history of nicotine dependence: Secondary | ICD-10-CM | POA: Insufficient documentation

## 2014-03-24 HISTORY — PX: TEE WITHOUT CARDIOVERSION: SHX5443

## 2014-03-24 SURGERY — ECHOCARDIOGRAM, TRANSESOPHAGEAL
Anesthesia: Moderate Sedation

## 2014-03-24 MED ORDER — BUTAMBEN-TETRACAINE-BENZOCAINE 2-2-14 % EX AERO
INHALATION_SPRAY | CUTANEOUS | Status: DC | PRN
  Administered 2014-03-24: 2 via TOPICAL

## 2014-03-24 MED ORDER — FENTANYL CITRATE 0.05 MG/ML IJ SOLN
INTRAMUSCULAR | Status: DC | PRN
  Administered 2014-03-24 (×2): 25 ug via INTRAVENOUS

## 2014-03-24 MED ORDER — SODIUM CHLORIDE 0.9 % IV SOLN
INTRAVENOUS | Status: DC
  Administered 2014-03-24: 08:00:00 via INTRAVENOUS

## 2014-03-24 MED ORDER — MIDAZOLAM HCL 10 MG/2ML IJ SOLN
INTRAMUSCULAR | Status: DC | PRN
  Administered 2014-03-24: 1 mg via INTRAVENOUS
  Administered 2014-03-24: 2 mg via INTRAVENOUS

## 2014-03-24 MED ORDER — FENTANYL CITRATE 0.05 MG/ML IJ SOLN
INTRAMUSCULAR | Status: AC
  Filled 2014-03-24: qty 2

## 2014-03-24 MED ORDER — MIDAZOLAM HCL 5 MG/ML IJ SOLN
INTRAMUSCULAR | Status: AC
  Filled 2014-03-24: qty 2

## 2014-03-24 NOTE — H&P (View-Only) (Signed)
     Frederick Burnett is a 74 y.o. male  Admit Date: 02/22/2014 Referring Physician: Donnie Coffin Primary Cardiologist:: HWB Tamala Julian, MD Chief complaint / reason for admission: Aortic regurgitation  HPI: The patient is 74 yo and has aortic regurgitation with recent echo showing increased LV cavity size and decreased EF compared with prior. He is asymptomatic. He denies orthopnea, PND, chest pain, syncope, and edema.    PMH:    Past Medical History  Diagnosis Date  . Hypercholesteremia   . HTN (hypertension)   . Aortic regurgitation   . Aortic root enlargement     PSH:    Past Surgical History  Procedure Laterality Date  . Multiple tooth extractions     ALLERGIES:   Review of patient's allergies indicates no known allergies. Prior to Admit Meds:   Prescriptions prior to admission  Medication Sig Dispense Refill Last Dose  . hydrochlorothiazide (HYDRODIURIL) 25 MG tablet Take 25 mg by mouth daily.   02/22/2014 at 0500  . Multiple Vitamin (MULTIVITAMIN) capsule Take 1 capsule by mouth daily.   02/22/2014 at 0500  . potassium chloride (K-DUR,KLOR-CON) 10 MEQ tablet Take 10 mEq by mouth daily.    02/22/2014 at 0500   Family HX:    Family History  Problem Relation Age of Onset  . Hypertension Mother    Social HX:    History   Social History  . Marital Status: Married    Spouse Name: N/A    Number of Children: N/A  . Years of Education: N/A   Occupational History  . Not on file.   Social History Main Topics  . Smoking status: Former Smoker    Types: Cigars    Quit date: 05/29/2001  . Smokeless tobacco: Not on file  . Alcohol Use: Not on file  . Drug Use: Not on file  . Sexual Activity: Not on file   Other Topics Concern  . Not on file   Social History Narrative  . No narrative on file     ROS  Denies chills and fever. Appetite is stable.  Physical Exam: Blood pressure 141/59, pulse 83, temperature 97.9 F (36.6 C), temperature source Oral, resp. rate  18, height 5\' 9"  (1.753 m), weight 186 lb (84.369 kg), SpO2 100 %.    Skin is clear HEENT is unremarkable Neck with no JVD or bruits Chest is clear Cardiac reveals 3/6 decrescendo diastolic AR murmur. No gallop or rub. 3/6 systolic murmur is heard. Abdomen is soft with no bruit or tenderness. Extremities without edema Neuro is normal..  Labs: Lab Results  Component Value Date   WBC 5.5 02/16/2014   HGB 13.9 02/16/2014   HCT 42.9 02/16/2014   MCV 86.3 02/16/2014   PLT 147.0* 02/16/2014    Recent Labs Lab 02/16/14 0931  NA 141  K 3.5  CL 109  CO2 25  BUN 11  CREATININE 1.2  CALCIUM 8.9  GLUCOSE 87   Lab Results  Component Value Date   CKTOTAL 195 12/29/2012     Radiology:  No results found.  EKG:  NSR with LVH  ASSESSMENT: 1. Severe AR with LVE and decreased LV systolic function 2. Hypertension 3. Dilated aortic root.  Plan:  1. Left and right heart cath with coronary angiography prior to referral for AVR and possible aortic root repair. Procedure and risk of stroke, death, MI, bleeding, kidney injury, allergy,  etc. Discussed and accepted by patient. Sinclair Grooms 02/22/2014 7:26 AM

## 2014-03-24 NOTE — CV Procedure (Signed)
    Transesophageal Echocardiogram Note  AMMAN BARTEL [id number] 11-03-1940  Procedure: Transesophageal Echocardiogram Indications: Aortic valve disease  Procedure Details Consent: Obtained Time Out: Verified patient identification, verified procedure, site/side was marked, verified correct patient position, special equipment/implants available, Radiology Safety Procedures followed,  medications/allergies/relevent history reviewed, required imaging and test results available.  Performed  Medications: Fentanyl: 50 mcg Versed: 3 mg  Left Ventrical: LVEF 35-40%, mildly dilated LV, diffuse hypokinesis Mitral valve: Mild to moderate MR, central, functional, structurally normal valve Aortic valve: thickened, severe AI  For complete reading please see TEE report.  Complications: No apparent complications Patient did tolerate procedure well.  Dorothy Spark, MD, [facility] 03/24/2014, 9:13 AM

## 2014-03-24 NOTE — Discharge Instructions (Signed)
Care After Refer to this sheet in the next few weeks. These instructions provide you with information on caring for yourself after your procedure. Your caregiver may also give you more specific instructions. Your treatment has been planned according to current medical practices, but problems sometimes occur. Call your caregiver if you have any problems or questions after your procedure. HOME CARE INSTRUCTIONS  If you were given medicine to help you relax (sedative), do not drive, operate machinery, or sign important documents for 24 hours.  Avoid alcohol and hot or warm beverages for the first 24 hours after the procedure.  Only take over-the-counter or prescription medicines for pain, discomfort, or fever as directed by your caregiver. You may resume taking your normal medicines unless your caregiver tells you otherwise. Ask your caregiver when you may resume taking medicines that may cause bleeding, such as aspirin, clopidogrel, or warfarin.  You may return to your normal diet and activities on the day after your procedure, or as directed by your caregiver. Walking may help to reduce any bloated feeling in your abdomen.  Drink enough fluids to keep your urine clear or pale yellow.  You may gargle with salt water if you have a sore throat. SEEK IMMEDIATE MEDICAL CARE IF:  You have severe nausea or vomiting.  You have severe abdominal pain, abdominal cramps that last longer than 6 hours, or abdominal swelling (distention).  You have severe shoulder or back pain.  You have trouble swallowing.  You have shortness of breath, your breathing is shallow, or you are breathing faster than normal.  You have a fever or a rapid heartbeat.  You vomit blood or material that looks like coffee grounds.  You have bloody, black, or tarry stools. MAKE SURE YOU:  Understand these instructions.  Will watch your condition.  Will get help right away if you are not doing well or get worse. Document  Released: 09/26/2003 Document Revised: 06/28/2013 Document Reviewed: 05/14/2011 [facility] Patient Information 2015 [place], [place]. This information is not intended to replace advice given to you by your health care provider. Make sure you discuss any questions you have with your health care provider.

## 2014-03-24 NOTE — Progress Notes (Signed)
*  PRELIMINARY RESULTS* Echocardiogram TEE has been performed.  Leavy Cella 03/24/2014, 10:09 AM

## 2014-03-24 NOTE — Interval H&P Note (Signed)
History and Physical Interval Note:  03/24/2014 9:13 AM  Frederick Burnett  has presented today for surgery, with the diagnosis of aortic regurgitation  The various methods of treatment have been discussed with the patient and family. After consideration of risks, benefits and other options for treatment, the patient has consented to  Procedure(s): TRANSESOPHAGEAL ECHOCARDIOGRAM (TEE) (N/A) as a surgical intervention .  The patient's history has been reviewed, patient examined, no change in status, stable for surgery.  I have reviewed the patient's chart and labs.  Questions were answered to the patient's satisfaction.     Dorothy Spark

## 2014-03-25 ENCOUNTER — Encounter (HOSPITAL_COMMUNITY): Payer: Self-pay | Admitting: Cardiology

## 2014-03-30 ENCOUNTER — Other Ambulatory Visit: Payer: Self-pay | Admitting: *Deleted

## 2014-03-30 ENCOUNTER — Encounter: Payer: Self-pay | Admitting: Surgery

## 2014-03-30 ENCOUNTER — Institutional Professional Consult (permissible substitution) (INDEPENDENT_AMBULATORY_CARE_PROVIDER_SITE_OTHER): Payer: Medicare Other | Admitting: Surgery

## 2014-03-30 VITALS — BP 138/74 | HR 91 | Resp 16 | Ht 69.0 in | Wt 189.0 lb

## 2014-03-30 DIAGNOSIS — I351 Nonrheumatic aortic (valve) insufficiency: Secondary | ICD-10-CM

## 2014-04-01 ENCOUNTER — Encounter: Payer: Self-pay | Admitting: Surgery

## 2014-04-01 NOTE — Progress Notes (Signed)
Cardiothoracic Surgery Consultation  PCP is Donnie Coffin, MD Referring Provider is Sinclair Grooms, MD  Chief Complaint  Patient presents with  . Aortic Insuffiency    and AORTIC REGURG...cath 02/22/14, TEE 03/24/14...ASYMPTOMATIC    HPI:  The patient is a 74 year old gentleman with HTN and hypercholesterolemia who drives a school bus and says that he was having a physical to maintain his license and was noted to have a murmur. He was seen by Dr. Daneen Schick in 12/2012. He had moderate to severe AI by echo with no heart failure symptoms. The aortic root was enlarged by echo but a CTA on 01/05/2013 showed a normal caliber aorta with a diameter of 3.6 cm at its greatest diameter in the mid ascending aorta. He was followed with echo and his most recent study on 03/24/2014 showed that the LV was mildly dilated and there was moderate LV dysfunction with a drop in the EF to 35-40% compared to the echo ib 11/25/2013 when the EF was 40-45%. The aortic valve leaflets are mildly thickened and not coapting with severe AI. There is mild to moderate central MR due to leaflet tethering from LV dilatation. Cardiac cath on 02/22/2014 showed severe AI with mild aortic root enlargement. There was moderate MR that is probably due to leaflet tethering and the catheter. There was no coronary disease. The ascending aorta appeared the same on TEE at 37 mm. He says that he feels fine and denies any symptoms although he says he doesn't overdo it.   Past Medical History  Diagnosis Date  . Hypercholesteremia   . HTN (hypertension)   . Aortic regurgitation   . Aortic root enlargement   . Aortic regurgitation     Past Surgical History  Procedure Laterality Date  . Multiple tooth extractions    . Left and right heart catheterization with coronary angiogram N/A 02/22/2014    Procedure: LEFT AND RIGHT HEART CATHETERIZATION WITH CORONARY ANGIOGRAM;  Surgeon: Sinclair Grooms, MD;  Location: [facility] CATH LAB;  Service:  Cardiovascular;  Laterality: N/A;  . Cardiac catheterization    . Tee without cardioversion N/A 03/24/2014    Procedure: TRANSESOPHAGEAL ECHOCARDIOGRAM (TEE);  Surgeon: Dorothy Spark, MD;  Location: [facility] ENDOSCOPY;  Service: Cardiovascular;  Laterality: N/A;    Family History  Problem Relation Age of Onset  . Hypertension Mother     Social History History  Substance Use Topics  . Smoking status: Former Smoker    Types: Cigars    Quit date: 05/29/2001  . Smokeless tobacco: Not on file  . Alcohol Use: No    Current Outpatient Prescriptions  Medication Sig Dispense Refill  . hydrochlorothiazide (HYDRODIURIL) 25 MG tablet Take 25 mg by mouth daily.    . Multiple Vitamin (MULTIVITAMIN) capsule Take 1 capsule by mouth daily.    . potassium chloride (K-DUR,KLOR-CON) 10 MEQ tablet Take 10 mEq by mouth daily.      No current facility-administered medications for this visit.    Not on File  Review of Systems  Constitutional: Negative for fever, activity change, fatigue and unexpected weight change.  HENT: Negative.   Eyes: Negative.   Respiratory: Negative for cough, shortness of breath and wheezing.   Cardiovascular: Negative for chest pain, palpitations and leg swelling.  Gastrointestinal: Negative.   Endocrine: Negative.   Genitourinary: Negative.   Musculoskeletal: Negative.   Skin: Negative.   Allergic/Immunologic: Negative.   Neurological: Negative.   Hematological: Negative.   Psychiatric/Behavioral: Negative.  BP 138/74 mmHg  Pulse 91  Resp 16  Ht 5\' 9"  (1.753 m)  Wt 189 lb (85.73 kg)  BMI 27.90 kg/m2  SpO2 98% Physical Exam  Constitutional: He is oriented to person, place, and time. He appears well-developed and well-nourished. No distress.  HENT:  Head: Normocephalic and atraumatic.  Mouth/Throat: Oropharynx is clear and moist.  Eyes: EOM are normal. Pupils are equal, round, and reactive to light.  Neck: Normal range of motion. Neck supple. No JVD  present. No thyromegaly present.  Cardiovascular: Normal rate and regular rhythm.  Exam reveals no gallop and no friction rub.   Murmur heard.  2/6 systolic murmur over aorta 2/6 decrescendo diastolic murmur over aorta to apex  Pulmonary/Chest: Effort normal and breath sounds normal. No respiratory distress. He has no rales.  Abdominal: Soft. Bowel sounds are normal. He exhibits no distension and no mass. There is no tenderness.  Musculoskeletal: Normal range of motion. He exhibits no edema.  Lymphadenopathy:    He has no cervical adenopathy.  Neurological: He is alert and oriented to person, place, and time. He has normal strength. No cranial nerve deficit or sensory deficit.  Skin: Skin is warm and dry.  Psychiatric: He has a normal mood and affect.     Diagnostic Tests:            *[facility]*            [address], [place] [address]              [phone]  ------------------------------------------------------------------- Transesophageal Echocardiography  Patient:  Frederick Burnett, Frederick Burnett MR #:    [id number] Study Date: 03/24/2014 Gender:   M Age:    63 Height:   175.3 cm Weight:   84.1 kg BSA:    2.04 m^2 Pt. Status: Room:  ADMITTING  Sinclair Grooms ATTENDING  Sinclair Grooms SONOGRAPHER [facility] ORDERING   Ena Dawley, M.D. PERFORMING  Ena Dawley, M.D.  cc:  ------------------------------------------------------------------- LV EF: 35% -  40%  ------------------------------------------------------------------- Indications:   Aortic insufficiency 424.1.  ------------------------------------------------------------------- Study Conclusions  - Left ventricle: The cavity size was mildly dilated. There was mild concentric hypertrophy. Systolic function was moderately reduced. The  estimated ejection fraction was in the range of 35% to 40%. Diffuse hypokinesis. - Aortic valve: Aortic valve leaflets are mildly thickened and non-coapting. There is severe aortic regurgitation and no stenosis. Vena contracta is > 10 mm. Aortic insufficiency jet fill almost the entire LVOT. - Ascending aorta: The ascending aorta was normal in size measuring 37 mm. This might be underestimated on TEE. For accurate measurement consider CT. - Descending aorta: The descending aorta is mildly dilated measuring 29 mm. - Mitral valve: Mitral valve leaflets are structurally normal. The leaflets are thethered by LV dilatation resulting in a central jet with mild to moderate mitral regurgitation. - Left atrium: The atrium was normal in size. No evidence of thrombus in the atrial cavity or appendage. No evidence of thrombus in the atrial cavity or appendage. No evidence of thrombus in the appendage. - Right ventricle: Systolic function was normal. - Right atrium: No evidence of thrombus in the atrial cavity or appendage. No evidence of thrombus in the atrial cavity or appendage. - Atrial septum: No defect or patent foramen ovale was identified. Echo contrast study showed no right-to-left atrial  level shunt, following an increase in RA pressure induced by provocative maneuvers. - Tricuspid valve: There was trivial regurgitation. - Pulmonic valve: No evidence of vegetation.  Impressions:  - Mildly dilated LV with moderately impaired systolic function, LVEF 95-18%. Mild to moderate functional MR. Severe AI with upper normal aortic root size and normal size ascending aorta. This might be underestimated on TEE study, for more accurate double oblique measurement consider a CT.  Diagnostic transesophageal echocardiography. 2D and color Doppler. Birthdate: Patient birthdate: September 23, 1940. Age: Patient is 74 yr old. Sex: Gender: male.  BMI: 27.4  kg/m^2. Blood pressure: 126/77 Patient status: Outpatient. Study date: Study date: 03/24/2014. Study time: 09:02 AM. Location: Endoscopy.  -------------------------------------------------------------------  ------------------------------------------------------------------- Left ventricle: The cavity size was mildly dilated. There was mild concentric hypertrophy. Systolic function was moderately reduced. The estimated ejection fraction was in the range of 35% to 40%. Diffuse hypokinesis.  ------------------------------------------------------------------- Aortic valve: Aortic valve leaflets are mildly thickened and non-coapting. There is severe aortic regurgitation and no stenosis. Vena contracta is > 10 mm. Aortic insufficiency jet fill almost the entire LVOT. Structurally normal valve. Trileaflet; normal thickness leaflets.  ------------------------------------------------------------------- Aorta: There was mild non-mobile atheroma. There was no evidence for dissection. Aortic root: The aortic root measures 37 mm in its widest diameter. Ascending aorta: The ascending aorta was normal in size measuring 37 mm. This might be underestimated on TEE. For accurate measurement consider CT. Descending aorta: The descending aorta is mildly dilated measuring 29 mm.  ------------------------------------------------------------------- Mitral valve: Mitral valve leaflets are structurally normal. The leaflets are thethered by LV dilatation resulting in a central jet with mild to moderate mitral regurgitation. Leaflet separation was normal.  ------------------------------------------------------------------- Left atrium: The atrium was normal in size. No evidence of thrombus in the atrial cavity or appendage. No evidence of thrombus in the atrial cavity or appendage. No evidence of thrombus in the appendage. The appendage was morphologically a left appendage,  multilobulated, and of normal size. Emptying velocity was normal.  ------------------------------------------------------------------- Atrial septum: No defect or patent foramen ovale was identified. Echo contrast study showed no right-to-left atrial level shunt, following an increase in RA pressure induced by provocative maneuvers.  ------------------------------------------------------------------- Right ventricle: The cavity size was normal. Wall thickness was normal. Systolic function was normal.  ------------------------------------------------------------------- Pulmonic valve:  Structurally normal valve.  Cusp separation was normal. No evidence of vegetation.  ------------------------------------------------------------------- Tricuspid valve:  Structurally normal valve.  Leaflet separation was normal. Doppler: There was trivial regurgitation.  ------------------------------------------------------------------- Pulmonary artery:  The main pulmonary artery was normal-sized.  ------------------------------------------------------------------- Right atrium: The atrium was normal in size. No evidence of thrombus in the atrial cavity or appendage. No evidence of thrombus in the atrial cavity or appendage. The appendage was morphologically a right appendage.  ------------------------------------------------------------------- Pericardium: There was no pericardial effusion.  ------------------------------------------------------------------- Prepared and Electronically Authenticated by  Ena Dawley, M.D. 2016-01-28T11:03:56   CLINICAL DATA: Aortic regurgitation, thoracic aortic enlargement  EXAM: CT ANGIOGRAPHY CHEST WITH CONTRAST  TECHNIQUE: Multidetector CT imaging of the chest was performed using the standard protocol during bolus administration of intravenous contrast. Multiplanar CT image reconstructions including MIPs were obtained to  evaluate the vascular anatomy.  CONTRAST: 155mL OMNIPAQUE IOHEXOL 350 MG/ML SOLN  COMPARISON: None.  FINDINGS: Thoracic aorta is normal in caliber throughout its length. Minimal scattered atheromatous calcified plaque in the aortic arch. No mural thrombus. No aneurysm, dissection, or stenosis. Classic 3 vessel brachiocephalic arterial origin anatomy without proximal stenosis. There is fairly good contrast opacification of pulmonary artery branches;  the exam was not optimized for detection of pulmonary emboli. No pleural or pericardial effusion. No hilar or mediastinal adenopathy. Dependent atelectasis posteriorly in both lower lobes. Small bleb in the anterior segment left upper lobe. Lungs otherwise clear. Thoracic spine and sternum intact. Visualized portions of upper abdomen unremarkable.  Review of the MIP images confirms the above findings.  IMPRESSION: 1. Mild atheromatous plaque in the aortic arch without an aneurysm, dissection, or stenosis.   Electronically Signed  By: Arne Cleveland M.D.  On: 01/05/2013 11:02    Left and Right Heart Catheterization with Coronary Angiography Report  SULLY DYMENT  74 y.o.  male May 28, 1940  Procedure Date: 02/22/2014 Referring Physician: Donnie Coffin, M.D. Primary Cardiologist:: H WB Blenda Bridegroom, M.D.  INDICATIONS: Severe aortic regurgitation with progressive left ventricular enlargement and decrease portion year over a year.  PROCEDURE: 1. Left heart cath; 2. Right heart cath; 3. Left ventriculography; 4. Aortography; 5. Coronary angiography  CONSENT:  The risks, benefits, and details of the procedure were explained in detail to the patient. Risks including death, stroke, heart attack, kidney injury, allergy, limb ischemia, bleeding and radiation injury were discussed. The patient verbalized understanding and wanted to proceed. Informed written consent was obtained.  PROCEDURE TECHNIQUE: After Xylocaine  anesthesia a 5 French sheath was placed in the right femoral artery and a 7 French sheath in the right femoral vein both using the modified Seldinger technique. 2% Xylocaine was used for local anesthesia. Coronary angiography was done using a 5 F A2 multipurpose catheter. Left ventriculography was done using a straight 5 French pigtail catheter and aortography was performed using the same catheter with power injection.   Hemostasis was achieved by manual compression  CONTRAST: Total of 125 cc.  COMPLICATIONS: None   HEMODYNAMICS: Aortic pressure 101/53 mmHg; LV pressure 106 /1 mmHg; LVEDP 7 mmHg; RA 4 mmHg mean; RV 24 /6 mmHg; PA 25/11 mmHg; PCWP(mean) 12 mmHg with V-wave to 19 mmHg; Cardiac Output 6.35 L/m; AV gradient none  ANGIOGRAPHIC DATA: The left main coronary artery is widely patent.  The left anterior descending artery is widely patent but reveals proximal and mid luminal irregularities.  The left circumflex artery is widely patent and contains 50% mid vessel stenosis after the first obtuse marginal branch. Beyond this region 2 moderate-sized diagonals arise..  The right coronary artery is patent. There are proximal and mid irregularities. The vessel is dominant.  SUPRAVALVULAR AORTIC ANGIOGRAPHY: Severe aortic regurgitation with aortic root enlargement  LEFT VENTRICULOGRAM: Left ventricular angiogram was done in the 30 RAO projection and revealed a dilated left ventricle, global hypokinesis with an estimated EF of 35-40%. Moderate mitral regurgitation is noted.   IMPRESSIONS: 1. Severe aortic regurgitation with moderate aortic root enlargement 2. Moderate mitral regurgitation, hopefully related to LV enlargement and not primary valvular abnormality 3. Global left ventricular hypokinesis with an estimated ejection fraction of 35-40% 4. Widely patent coronary arteries 5. Normal pulmonary pressures and normal left heart filling pressures.   RECOMMENDATION: Patient  will need to be set up for a transesophageal echo The patient will be referred to Dr. Gilford Raid.    Impression:   I have personally reviewed his TEE, cardiac cath and CT scan.  He has severe aortic insufficiency with mild LV dilatation and progressive LV dysfunction. He is asymptomatic but with LV deterioration it is time to do surgery. His aorta is minimally enlarged by TEE and about the same size that it was by CT scan in 12/2012. I don't think  this needs to be replaced in this 74 year old with a trileaflet aortic valve. He has mild to moderate MR. I reviewed his TEE and I think it is mild central MR due to leaflet tethering from LV dilatation. The valve looks structurally normal. This will usually get better with AVR as the ventricle is less stressed and decreases in size. I would plan to do AVR using a pericardial tissue valve. I discussed the operative procedure with the patient and his son including alternatives, benefits and risks; including but not limited to bleeding, blood transfusion, infection, stroke, myocardial infarction, graft failure, heart block requiring a permanent pacemaker, organ dysfunction, and death.  Pierce Crane Janek understands and agrees to proceed.  We will schedule surgery for 04/11/2014.  Plan:  AVR using a tissue valve on 04/11/2014.

## 2014-04-06 NOTE — Pre-Procedure Instructions (Signed)
Frederick Burnett  04/06/2014   Your procedure is scheduled on: Monday, April 11, 2014  Report to [facility] Admitting at 5:30 AM.  Call this number if you have problems the morning of surgery: [phone]   Remember: [place] and cardiac teaching packet to the hospital on day of admission   Do not eat food or drink liquids after midnight Sunday, April 10, 2014   Take these medicines the morning of surgery with A SIP OF WATER: None  Stop taking vitamins and herbal medications. Do not take any NSAIDs ie: Ibuprofen, Advil, Naproxen and etc.; stop now.   Do not wear jewelry  Do not wear lotions, powders, or perfumes. You may not wear deodorant.   Men may shave face and neck.  Do not bring valuables to the hospital.  [facility] is not responsible for any belongings or valuables.               Contacts, dentures or bridgework may not be worn into surgery.  Leave suitcase in the car. After surgery it may be brought to your room.  For patients admitted to the hospital, discharge time is determined by your treatment team.               Patients discharged the day of surgery will not be allowed to drive home.  Name and phone number of your driver: /w son in law  Special Instructions:  Special Instructions:Special Instructions: [facility] - Preparing for Surgery  Before surgery, you can play an important role.  Because skin is not sterile, your skin needs to be as free of germs as possible.  You can reduce the number of germs on you skin by washing with CHG (chlorahexidine gluconate) soap before surgery.  CHG is an antiseptic cleaner which kills germs and bonds with the skin to continue killing germs even after washing.  Please DO NOT use if you have an allergy to CHG or antibacterial soaps.  If your skin becomes reddened/irritated stop using the CHG and inform your nurse when you arrive at Short Stay.  Do not shave (including legs and underarms) for at least  48 hours prior to the first CHG shower.  You may shave your face.  Please follow these instructions carefully:   1.  Shower with CHG Soap the night before surgery and the morning of Surgery.  2.  If you choose to wash your hair, wash your hair first as usual with your normal shampoo.  3.  After you shampoo, rinse your hair and body thoroughly to remove the Shampoo.  4.  Use CHG as you would any other liquid soap.  You can apply chg directly  to the skin and wash gently with scrungie or a clean washcloth.  5.  Apply the CHG Soap to your body ONLY FROM THE NECK DOWN.  Do not use on open wounds or open sores.  Avoid contact with your eyes, ears, mouth and genitals (private parts).  Wash genitals (private parts) with your normal soap.  6.  Wash thoroughly, paying special attention to the area where your surgery will be performed.  7.  Thoroughly rinse your body with warm water from the neck down.  8.  DO NOT shower/wash with your normal soap after using and rinsing off the CHG Soap.  9.  Pat yourself dry with a clean towel.            10 .  Wear clean pajamas.  11.  Place clean sheets on your bed the night of your first shower and do not sleep with pets.  Day of Surgery  Do not apply any lotions/deodorants the morning of surgery.  Please wear clean clothes to the hospital/surgery center.   Please read over the following fact sheets that you were given: Pain Booklet, Coughing and Deep Breathing, Blood Transfusion Information, Open Heart Packet, MRSA Information and Surgical Site Infection Prevention

## 2014-04-07 ENCOUNTER — Ambulatory Visit (HOSPITAL_COMMUNITY)
Admission: RE | Admit: 2014-04-07 | Discharge: 2014-04-07 | Disposition: A | Discharge status: Home / Self Care | Payer: Medicare Other | Source: Ambulatory Visit | Attending: Surgery | Admitting: Surgery

## 2014-04-07 ENCOUNTER — Encounter (HOSPITAL_COMMUNITY): Payer: Self-pay

## 2014-04-07 ENCOUNTER — Other Ambulatory Visit (HOSPITAL_COMMUNITY): Payer: Medicare Other

## 2014-04-07 ENCOUNTER — Inpatient Hospital Stay (HOSPITAL_COMMUNITY)
Admission: RE | Admit: 2014-04-07 | Discharge: 2014-04-07 | Disposition: A | Discharge status: Home / Self Care | Payer: Medicare Other | Source: Ambulatory Visit | Attending: Surgery | Admitting: Surgery

## 2014-04-07 ENCOUNTER — Encounter (HOSPITAL_COMMUNITY)
Admission: RE | Admit: 2014-04-07 | Discharge: 2014-04-07 | Disposition: A | Discharge status: Home / Self Care | Payer: Medicare Other | Source: Ambulatory Visit | Attending: Surgery | Admitting: Surgery

## 2014-04-07 VITALS — BP 165/68 | HR 88 | Temp 97.8°F | Resp 18 | Wt 192.3 lb

## 2014-04-07 DIAGNOSIS — I351 Nonrheumatic aortic (valve) insufficiency: Secondary | ICD-10-CM

## 2014-04-07 DIAGNOSIS — I350 Nonrheumatic aortic (valve) stenosis: Secondary | ICD-10-CM | POA: Insufficient documentation

## 2014-04-07 DIAGNOSIS — Z01810 Encounter for preprocedural cardiovascular examination: Secondary | ICD-10-CM | POA: Diagnosis not present

## 2014-04-07 HISTORY — DX: Unspecified osteoarthritis, unspecified site: M19.90

## 2014-04-07 HISTORY — DX: Cardiac murmur, unspecified: R01.1

## 2014-04-07 LAB — PULMONARY FUNCTION TEST
DL/VA % PRED: 103 %
DL/VA: 4.67 ml/min/mmHg/L
DLCO cor % pred: 74 %
DLCO cor: 23.11 ml/min/mmHg
DLCO unc % pred: 74 %
DLCO unc: 23.11 ml/min/mmHg
FEF 25-75 POST: 0.9 L/s
FEF 25-75 Pre: 1.67 L/sec
FEF2575-%CHANGE-POST: -46 %
FEF2575-%PRED-POST: 40 %
FEF2575-%Pred-Pre: 74 %
FEV1-%CHANGE-POST: -17 %
FEV1-%PRED-PRE: 82 %
FEV1-%Pred-Post: 68 %
FEV1-POST: 1.83 L
FEV1-Pre: 2.22 L
FEV1FVC-%CHANGE-POST: -14 %
FEV1FVC-%Pred-Pre: 84 %
FEV6-%Change-Post: -2 %
FEV6-%PRED-POST: 95 %
FEV6-%Pred-Pre: 98 %
FEV6-PRE: 3.37 L
FEV6-Post: 3.28 L
FEV6FVC-%Change-Post: 0 %
FEV6FVC-%PRED-POST: 104 %
FEV6FVC-%Pred-Pre: 104 %
FVC-%Change-Post: -3 %
FVC-%Pred-Post: 92 %
FVC-%Pred-Pre: 95 %
FVC-POST: 3.34 L
FVC-PRE: 3.44 L
Post FEV1/FVC ratio: 55 %
Post FEV6/FVC ratio: 99 %
Pre FEV1/FVC ratio: 64 %
Pre FEV6/FVC Ratio: 100 %
RV % PRED: 90 %
RV: 2.23 L
TLC % pred: 86 %
TLC: 5.92 L

## 2014-04-07 LAB — BLOOD GAS, ARTERIAL
ACID-BASE DEFICIT: 0.3 mmol/L (ref 0.0–2.0)
BICARBONATE: 23.1 meq/L (ref 20.0–24.0)
Drawn by: 421801
FIO2: 0.21 %
O2 Saturation: 98.6 %
PATIENT TEMPERATURE: 98.6
TCO2: 24.2 mmol/L (ref 0–100)
pCO2 arterial: 33.2 mmHg — ABNORMAL LOW (ref 35.0–45.0)
pH, Arterial: 7.458 — ABNORMAL HIGH (ref 7.350–7.450)
pO2, Arterial: 115 mmHg — ABNORMAL HIGH (ref 80.0–100.0)

## 2014-04-07 LAB — COMPREHENSIVE METABOLIC PANEL
ALT: 20 U/L (ref 0–53)
ANION GAP: 12 (ref 5–15)
AST: 21 U/L (ref 0–37)
Albumin: 3.8 g/dL (ref 3.5–5.2)
Alkaline Phosphatase: 83 U/L (ref 39–117)
BILIRUBIN TOTAL: 0.5 mg/dL (ref 0.3–1.2)
BUN: 10 mg/dL (ref 6–23)
CALCIUM: 9 mg/dL (ref 8.4–10.5)
CO2: 20 mmol/L (ref 19–32)
CREATININE: 1.04 mg/dL (ref 0.50–1.35)
Chloride: 108 mmol/L (ref 96–112)
GFR calc Af Amer: 80 mL/min — ABNORMAL LOW (ref >90)
GFR calc non Af Amer: 69 mL/min — ABNORMAL LOW (ref >90)
Glucose, Bld: 84 mg/dL (ref 70–99)
Potassium: 4.1 mmol/L (ref 3.5–5.1)
SODIUM: 140 mmol/L (ref 135–145)
Total Protein: 6.5 g/dL (ref 6.0–8.3)

## 2014-04-07 LAB — CBC
HEMATOCRIT: 40 % (ref 39.0–52.0)
Hemoglobin: 14 g/dL (ref 13.0–17.0)
MCH: 28.8 pg (ref 26.0–34.0)
MCHC: 35 g/dL (ref 30.0–36.0)
MCV: 82.3 fL (ref 78.0–100.0)
Platelets: 109 10*3/uL — ABNORMAL LOW (ref 150–400)
RBC: 4.86 MIL/uL (ref 4.22–5.81)
RDW: 12.9 % (ref 11.5–15.5)
WBC: 4.8 10*3/uL (ref 4.0–10.5)

## 2014-04-07 LAB — PROTIME-INR
INR: 1.07 (ref 0.00–1.49)
PROTHROMBIN TIME: 14 s (ref 11.6–15.2)

## 2014-04-07 LAB — SURGICAL PCR SCREEN
MRSA, PCR: NEGATIVE
Staphylococcus aureus: NEGATIVE

## 2014-04-07 LAB — URINALYSIS, ROUTINE W REFLEX MICROSCOPIC
BILIRUBIN URINE: NEGATIVE
Glucose, UA: NEGATIVE mg/dL
Hgb urine dipstick: NEGATIVE
Ketones, ur: NEGATIVE mg/dL
Leukocytes, UA: NEGATIVE
Nitrite: NEGATIVE
PROTEIN: NEGATIVE mg/dL
Specific Gravity, Urine: 1.018 (ref 1.005–1.030)
UROBILINOGEN UA: 1 mg/dL (ref 0.0–1.0)
pH: 6.5 (ref 5.0–8.0)

## 2014-04-07 LAB — APTT: APTT: 32 s (ref 24–37)

## 2014-04-07 LAB — ABO/RH: ABO/RH(D): AB POS

## 2014-04-07 MED ORDER — ALBUTEROL SULFATE (2.5 MG/3ML) 0.083% IN NEBU
2.5000 mg | INHALATION_SOLUTION | Freq: Once | RESPIRATORY_TRACT | Status: AC
  Administered 2014-04-07: 2.5 mg via RESPIRATORY_TRACT

## 2014-04-07 NOTE — Progress Notes (Signed)
Pre-op Cardiac Surgery  Carotid Findings:   Bilateral:  1-39% ICA stenosis.  Vertebral artery flow is antegrade.      Upper Extremity Right Left  Brachial Pressures 155 151  Radial Waveforms Tri Tri  Ulnar Waveforms Tri Tri  Palmar Arch (Allen's Test) Obliterates with radial compression, normal with ulnar compression Normal   Landry Mellow, RDMS, RVT 04/07/2014

## 2014-04-08 LAB — HEMOGLOBIN A1C
Hgb A1c MFr Bld: 5.7 % — ABNORMAL HIGH (ref 4.8–5.6)
Mean Plasma Glucose: 117 mg/dL

## 2014-04-08 NOTE — Progress Notes (Signed)
Spoke with pt and informed him of new arrival time of 0700 with all other instructions remaining the same.  Pt states understanding.

## 2014-04-10 MED ORDER — SODIUM CHLORIDE 0.9 % IV SOLN
INTRAVENOUS | Status: DC
  Filled 2014-04-10: qty 30

## 2014-04-10 MED ORDER — MAGNESIUM SULFATE 50 % IJ SOLN
40.0000 meq | INTRAMUSCULAR | Status: DC
  Filled 2014-04-10: qty 10

## 2014-04-10 MED ORDER — VANCOMYCIN HCL 10 G IV SOLR
1250.0000 mg | INTRAVENOUS | Status: AC
  Administered 2014-04-11: 1250 mg via INTRAVENOUS
  Filled 2014-04-10 (×2): qty 1250

## 2014-04-10 MED ORDER — NITROGLYCERIN IN D5W 200-5 MCG/ML-% IV SOLN
2.0000 ug/min | INTRAVENOUS | Status: DC
  Filled 2014-04-10: qty 250

## 2014-04-10 MED ORDER — SODIUM CHLORIDE 0.9 % IV SOLN
INTRAVENOUS | Status: AC
  Administered 2014-04-11: 1.2 [IU]/h via INTRAVENOUS
  Filled 2014-04-10: qty 2.5

## 2014-04-10 MED ORDER — PLASMA-LYTE 148 IV SOLN
INTRAVENOUS | Status: AC
  Administered 2014-04-11: 500 mL
  Filled 2014-04-10: qty 2.5

## 2014-04-10 MED ORDER — POTASSIUM CHLORIDE 2 MEQ/ML IV SOLN
80.0000 meq | INTRAVENOUS | Status: DC
  Filled 2014-04-10: qty 40

## 2014-04-10 MED ORDER — DOPAMINE-DEXTROSE 3.2-5 MG/ML-% IV SOLN
0.0000 ug/kg/min | INTRAVENOUS | Status: DC
  Filled 2014-04-10: qty 250

## 2014-04-10 MED ORDER — DEXTROSE 5 % IV SOLN
1.5000 g | INTRAVENOUS | Status: AC
  Administered 2014-04-11: 1.5 g via INTRAVENOUS
  Administered 2014-04-11: .75 g via INTRAVENOUS
  Filled 2014-04-10: qty 1.5

## 2014-04-10 MED ORDER — DEXMEDETOMIDINE HCL IN NACL 400 MCG/100ML IV SOLN
0.1000 ug/kg/h | INTRAVENOUS | Status: AC
  Administered 2014-04-11: 0.3 ug/kg/h via INTRAVENOUS
  Filled 2014-04-10: qty 100

## 2014-04-10 MED ORDER — SODIUM CHLORIDE 0.9 % IV SOLN
INTRAVENOUS | Status: AC
  Administered 2014-04-11: 70 mL/h via INTRAVENOUS
  Filled 2014-04-10: qty 40

## 2014-04-10 MED ORDER — EPINEPHRINE HCL 1 MG/ML IJ SOLN
0.0000 ug/min | INTRAVENOUS | Status: DC
  Filled 2014-04-10: qty 4

## 2014-04-10 MED ORDER — DEXTROSE 5 % IV SOLN
750.0000 mg | INTRAVENOUS | Status: DC
  Filled 2014-04-10: qty 750

## 2014-04-10 MED ORDER — PHENYLEPHRINE HCL 10 MG/ML IJ SOLN
30.0000 ug/min | INTRAVENOUS | Status: AC
  Administered 2014-04-11: 20 ug/min via INTRAVENOUS
  Filled 2014-04-10: qty 2

## 2014-04-11 ENCOUNTER — Inpatient Hospital Stay (HOSPITAL_COMMUNITY): Payer: Medicare Other | Admitting: Certified Registered"

## 2014-04-11 ENCOUNTER — Inpatient Hospital Stay (HOSPITAL_COMMUNITY)
Admission: RE | Admit: 2014-04-11 | Discharge: 2014-04-15 | DRG: 220 | Disposition: A | Discharge status: Home / Self Care | Payer: Medicare Other | Source: Ambulatory Visit | Attending: Surgery | Admitting: Surgery

## 2014-04-11 ENCOUNTER — Encounter (HOSPITAL_COMMUNITY): Payer: Self-pay

## 2014-04-11 ENCOUNTER — Inpatient Hospital Stay (HOSPITAL_COMMUNITY): Payer: Medicare Other

## 2014-04-11 ENCOUNTER — Encounter (HOSPITAL_COMMUNITY)
Admission: RE | Disposition: A | Discharge status: Home / Self Care | Payer: Medicare Other | Source: Ambulatory Visit | Attending: Surgery

## 2014-04-11 DIAGNOSIS — Z8249 Family history of ischemic heart disease and other diseases of the circulatory system: Secondary | ICD-10-CM | POA: Diagnosis not present

## 2014-04-11 DIAGNOSIS — I10 Essential (primary) hypertension: Secondary | ICD-10-CM | POA: Diagnosis present

## 2014-04-11 DIAGNOSIS — K5900 Constipation, unspecified: Secondary | ICD-10-CM | POA: Diagnosis not present

## 2014-04-11 DIAGNOSIS — E780 Pure hypercholesterolemia: Secondary | ICD-10-CM | POA: Diagnosis present

## 2014-04-11 DIAGNOSIS — D62 Acute posthemorrhagic anemia: Secondary | ICD-10-CM | POA: Diagnosis not present

## 2014-04-11 DIAGNOSIS — I351 Nonrheumatic aortic (valve) insufficiency: Secondary | ICD-10-CM

## 2014-04-11 DIAGNOSIS — Z79899 Other long term (current) drug therapy: Secondary | ICD-10-CM

## 2014-04-11 DIAGNOSIS — D696 Thrombocytopenia, unspecified: Secondary | ICD-10-CM | POA: Diagnosis present

## 2014-04-11 DIAGNOSIS — Z87891 Personal history of nicotine dependence: Secondary | ICD-10-CM

## 2014-04-11 DIAGNOSIS — I340 Nonrheumatic mitral (valve) insufficiency: Secondary | ICD-10-CM | POA: Diagnosis present

## 2014-04-11 DIAGNOSIS — R110 Nausea: Secondary | ICD-10-CM | POA: Diagnosis not present

## 2014-04-11 DIAGNOSIS — Z952 Presence of prosthetic heart valve: Secondary | ICD-10-CM

## 2014-04-11 HISTORY — PX: AORTIC VALVE REPLACEMENT: SHX41

## 2014-04-11 HISTORY — PX: TEE WITHOUT CARDIOVERSION: SHX5443

## 2014-04-11 LAB — POCT I-STAT, CHEM 8
BUN: 10 mg/dL (ref 6–23)
BUN: 11 mg/dL (ref 6–23)
BUN: 9 mg/dL (ref 6–23)
BUN: 9 mg/dL (ref 6–23)
BUN: 11 mg/dL (ref 6–23)
BUN: 9 mg/dL (ref 6–23)
CALCIUM ION: 1.18 mmol/L (ref 1.13–1.30)
CHLORIDE: 102 mmol/L (ref 96–112)
CHLORIDE: 107 mmol/L (ref 96–112)
CREATININE: 0.8 mg/dL (ref 0.50–1.35)
Calcium, Ion: 1.23 mmol/L (ref 1.13–1.30)
Calcium, Ion: 1.24 mmol/L (ref 1.13–1.30)
Calcium, Ion: 1.02 mmol/L — ABNORMAL LOW (ref 1.13–1.30)
Calcium, Ion: 1.08 mmol/L — ABNORMAL LOW (ref 1.13–1.30)
Calcium, Ion: 1.14 mmol/L (ref 1.13–1.30)
Chloride: 105 mmol/L (ref 96–112)
Chloride: 106 mmol/L (ref 96–112)
Chloride: 102 mmol/L (ref 96–112)
Chloride: 102 mmol/L (ref 96–112)
Creatinine, Ser: 0.9 mg/dL (ref 0.50–1.35)
Creatinine, Ser: 0.9 mg/dL (ref 0.50–1.35)
Creatinine, Ser: 0.8 mg/dL (ref 0.50–1.35)
Creatinine, Ser: 0.8 mg/dL (ref 0.50–1.35)
Creatinine, Ser: 1 mg/dL (ref 0.50–1.35)
GLUCOSE: 113 mg/dL — AB (ref 70–99)
Glucose, Bld: 117 mg/dL — ABNORMAL HIGH (ref 70–99)
Glucose, Bld: 99 mg/dL (ref 70–99)
Glucose, Bld: 103 mg/dL — ABNORMAL HIGH (ref 70–99)
Glucose, Bld: 126 mg/dL — ABNORMAL HIGH (ref 70–99)
Glucose, Bld: 150 mg/dL — ABNORMAL HIGH (ref 70–99)
HCT: 36 % — ABNORMAL LOW (ref 39.0–52.0)
HCT: 26 % — ABNORMAL LOW (ref 39.0–52.0)
HCT: 28 % — ABNORMAL LOW (ref 39.0–52.0)
HCT: 28 % — ABNORMAL LOW (ref 39.0–52.0)
HCT: 33 % — ABNORMAL LOW (ref 39.0–52.0)
HEMATOCRIT: 33 % — AB (ref 39.0–52.0)
HEMOGLOBIN: 11.2 g/dL — AB (ref 13.0–17.0)
HEMOGLOBIN: 11.2 g/dL — AB (ref 13.0–17.0)
Hemoglobin: 12.2 g/dL — ABNORMAL LOW (ref 13.0–17.0)
Hemoglobin: 8.8 g/dL — ABNORMAL LOW (ref 13.0–17.0)
Hemoglobin: 9.5 g/dL — ABNORMAL LOW (ref 13.0–17.0)
Hemoglobin: 9.5 g/dL — ABNORMAL LOW (ref 13.0–17.0)
POTASSIUM: 4.1 mmol/L (ref 3.5–5.1)
POTASSIUM: 4.3 mmol/L (ref 3.5–5.1)
Potassium: 3.2 mmol/L — ABNORMAL LOW (ref 3.5–5.1)
Potassium: 3.3 mmol/L — ABNORMAL LOW (ref 3.5–5.1)
Potassium: 3.3 mmol/L — ABNORMAL LOW (ref 3.5–5.1)
Potassium: 3.9 mmol/L (ref 3.5–5.1)
SODIUM: 143 mmol/L (ref 135–145)
SODIUM: 135 mmol/L (ref 135–145)
SODIUM: 144 mmol/L (ref 135–145)
Sodium: 142 mmol/L (ref 135–145)
Sodium: 143 mmol/L (ref 135–145)
Sodium: 139 mmol/L (ref 135–145)
TCO2: 23 mmol/L (ref 0–100)
TCO2: 23 mmol/L (ref 0–100)
TCO2: 21 mmol/L (ref 0–100)
TCO2: 22 mmol/L (ref 0–100)
TCO2: 24 mmol/L (ref 0–100)
TCO2: 20 mmol/L (ref 0–100)

## 2014-04-11 LAB — POCT I-STAT 4, (NA,K, GLUC, HGB,HCT)
GLUCOSE: 105 mg/dL — AB (ref 70–99)
HEMATOCRIT: 35 % — AB (ref 39.0–52.0)
Hemoglobin: 11.9 g/dL — ABNORMAL LOW (ref 13.0–17.0)
Potassium: 3.6 mmol/L (ref 3.5–5.1)
SODIUM: 142 mmol/L (ref 135–145)

## 2014-04-11 LAB — POCT I-STAT 3, ART BLOOD GAS (G3+)
ACID-BASE DEFICIT: 5 mmol/L — AB (ref 0.0–2.0)
ACID-BASE DEFICIT: 1 mmol/L (ref 0.0–2.0)
Acid-Base Excess: 2 mmol/L (ref 0.0–2.0)
Acid-base deficit: 2 mmol/L (ref 0.0–2.0)
BICARBONATE: 25.8 meq/L — AB (ref 20.0–24.0)
BICARBONATE: 21.4 meq/L (ref 20.0–24.0)
Bicarbonate: 24.5 mEq/L — ABNORMAL HIGH (ref 20.0–24.0)
Bicarbonate: 27.1 mEq/L — ABNORMAL HIGH (ref 20.0–24.0)
Bicarbonate: 23.8 mEq/L (ref 20.0–24.0)
Bicarbonate: 25.2 mEq/L — ABNORMAL HIGH (ref 20.0–24.0)
O2 SAT: 99 %
O2 SAT: 97 %
O2 Saturation: 100 %
O2 Saturation: 100 %
O2 Saturation: 100 %
O2 Saturation: 99 %
PCO2 ART: 45 mmHg (ref 35.0–45.0)
PCO2 ART: 37.9 mmHg (ref 35.0–45.0)
PCO2 ART: 43 mmHg (ref 35.0–45.0)
PH ART: 7.418 (ref 7.350–7.450)
PH ART: 7.413 (ref 7.350–7.450)
PH ART: 7.349 — AB (ref 7.350–7.450)
PH ART: 7.323 — AB (ref 7.350–7.450)
PO2 ART: 501 mmHg — AB (ref 80.0–100.0)
PO2 ART: 383 mmHg — AB (ref 80.0–100.0)
PO2 ART: 132 mmHg — AB (ref 80.0–100.0)
Patient temperature: 36.8
Patient temperature: 36.9
TCO2: 27 mmol/L (ref 0–100)
TCO2: 26 mmol/L (ref 0–100)
TCO2: 28 mmol/L (ref 0–100)
TCO2: 25 mmol/L (ref 0–100)
TCO2: 23 mmol/L (ref 0–100)
TCO2: 27 mmol/L (ref 0–100)
pCO2 arterial: 42.5 mmHg (ref 35.0–45.0)
pCO2 arterial: 44.2 mmHg (ref 35.0–45.0)
pCO2 arterial: 48.4 mmHg — ABNORMAL HIGH (ref 35.0–45.0)
pH, Arterial: 7.366 (ref 7.350–7.450)
pH, Arterial: 7.292 — ABNORMAL LOW (ref 7.350–7.450)
pO2, Arterial: 419 mmHg — ABNORMAL HIGH (ref 80.0–100.0)
pO2, Arterial: 145 mmHg — ABNORMAL HIGH (ref 80.0–100.0)
pO2, Arterial: 96 mmHg (ref 80.0–100.0)

## 2014-04-11 LAB — CREATININE, SERUM
Creatinine, Ser: 1.13 mg/dL (ref 0.50–1.35)
GFR calc Af Amer: 73 mL/min — ABNORMAL LOW (ref >90)
GFR, EST NON AFRICAN AMERICAN: 63 mL/min — AB (ref >90)

## 2014-04-11 LAB — CBC
HCT: 33.8 % — ABNORMAL LOW (ref 39.0–52.0)
HCT: 32.8 % — ABNORMAL LOW (ref 39.0–52.0)
Hemoglobin: 11.3 g/dL — ABNORMAL LOW (ref 13.0–17.0)
Hemoglobin: 11.1 g/dL — ABNORMAL LOW (ref 13.0–17.0)
MCH: 27.6 pg (ref 26.0–34.0)
MCH: 28 pg (ref 26.0–34.0)
MCHC: 33.4 g/dL (ref 30.0–36.0)
MCHC: 33.8 g/dL (ref 30.0–36.0)
MCV: 82.4 fL (ref 78.0–100.0)
MCV: 82.8 fL (ref 78.0–100.0)
PLATELETS: 117 10*3/uL — AB (ref 150–400)
Platelets: 126 10*3/uL — ABNORMAL LOW (ref 150–400)
RBC: 4.1 MIL/uL — ABNORMAL LOW (ref 4.22–5.81)
RBC: 3.96 MIL/uL — ABNORMAL LOW (ref 4.22–5.81)
RDW: 12.9 % (ref 11.5–15.5)
RDW: 13 % (ref 11.5–15.5)
WBC: 8 10*3/uL (ref 4.0–10.5)
WBC: 7.5 10*3/uL (ref 4.0–10.5)

## 2014-04-11 LAB — GLUCOSE, CAPILLARY
GLUCOSE-CAPILLARY: 95 mg/dL (ref 70–99)
GLUCOSE-CAPILLARY: 122 mg/dL — AB (ref 70–99)
Glucose-Capillary: 100 mg/dL — ABNORMAL HIGH (ref 70–99)
Glucose-Capillary: 91 mg/dL (ref 70–99)
Glucose-Capillary: 122 mg/dL — ABNORMAL HIGH (ref 70–99)
Glucose-Capillary: 134 mg/dL — ABNORMAL HIGH (ref 70–99)
Glucose-Capillary: 180 mg/dL — ABNORMAL HIGH (ref 70–99)

## 2014-04-11 LAB — APTT: aPTT: 44 seconds — ABNORMAL HIGH (ref 24–37)

## 2014-04-11 LAB — HEMOGLOBIN AND HEMATOCRIT, BLOOD
HCT: 27.2 % — ABNORMAL LOW (ref 39.0–52.0)
HEMOGLOBIN: 9.1 g/dL — AB (ref 13.0–17.0)

## 2014-04-11 LAB — PROTIME-INR
INR: 1.4 (ref 0.00–1.49)
PROTHROMBIN TIME: 17.3 s — AB (ref 11.6–15.2)

## 2014-04-11 LAB — MAGNESIUM: Magnesium: 2.9 mg/dL — ABNORMAL HIGH (ref 1.5–2.5)

## 2014-04-11 LAB — PLATELET COUNT: Platelets: 79 10*3/uL — ABNORMAL LOW (ref 150–400)

## 2014-04-11 SURGERY — REPLACEMENT, AORTIC VALVE, OPEN
Anesthesia: General | Site: Chest

## 2014-04-11 MED ORDER — POTASSIUM CHLORIDE 10 MEQ/50ML IV SOLN
10.0000 meq | Freq: Once | INTRAVENOUS | Status: AC
  Administered 2014-04-11: 10 meq via INTRAVENOUS

## 2014-04-11 MED ORDER — FENTANYL CITRATE 0.05 MG/ML IJ SOLN
INTRAMUSCULAR | Status: AC
  Filled 2014-04-11: qty 5

## 2014-04-11 MED ORDER — PROPOFOL 10 MG/ML IV BOLUS
INTRAVENOUS | Status: DC | PRN
  Administered 2014-04-11: 100 mg via INTRAVENOUS

## 2014-04-11 MED ORDER — INSULIN REGULAR BOLUS VIA INFUSION
0.0000 [IU] | Freq: Three times a day (TID) | INTRAVENOUS | Status: DC
  Filled 2014-04-11: qty 10

## 2014-04-11 MED ORDER — THROMBIN 20000 UNITS EX SOLR
CUTANEOUS | Status: AC
  Filled 2014-04-11: qty 20000

## 2014-04-11 MED ORDER — ASPIRIN EC 325 MG PO TBEC
325.0000 mg | DELAYED_RELEASE_TABLET | Freq: Every day | ORAL | Status: DC
  Administered 2014-04-12 – 2014-04-13 (×2): 325 mg via ORAL
  Filled 2014-04-11 (×2): qty 1

## 2014-04-11 MED ORDER — PROTAMINE SULFATE 10 MG/ML IV SOLN
INTRAVENOUS | Status: DC | PRN
  Administered 2014-04-11: 25 mg via INTRAVENOUS
  Administered 2014-04-11: 75 mg via INTRAVENOUS
  Administered 2014-04-11: 50 mg via INTRAVENOUS

## 2014-04-11 MED ORDER — BISACODYL 5 MG PO TBEC
10.0000 mg | DELAYED_RELEASE_TABLET | Freq: Every day | ORAL | Status: DC
  Administered 2014-04-12 – 2014-04-13 (×2): 10 mg via ORAL
  Filled 2014-04-11 (×2): qty 2

## 2014-04-11 MED ORDER — DOCUSATE SODIUM 100 MG PO CAPS
200.0000 mg | ORAL_CAPSULE | Freq: Every day | ORAL | Status: DC
  Administered 2014-04-12 – 2014-04-13 (×2): 200 mg via ORAL
  Filled 2014-04-11 (×2): qty 2

## 2014-04-11 MED ORDER — SODIUM CHLORIDE 0.9 % IV SOLN: 250.0000 mL | INTRAVENOUS | Status: DC

## 2014-04-11 MED ORDER — CHLORHEXIDINE GLUCONATE 4 % EX LIQD
30.0000 mL | CUTANEOUS | Status: DC
  Filled 2014-04-11: qty 30

## 2014-04-11 MED ORDER — ARTIFICIAL TEARS OP OINT
TOPICAL_OINTMENT | OPHTHALMIC | Status: DC | PRN
  Administered 2014-04-11: 1 via OPHTHALMIC

## 2014-04-11 MED ORDER — CHLORHEXIDINE GLUCONATE 0.12 % MT SOLN
15.0000 mL | Freq: Two times a day (BID) | OROMUCOSAL | Status: DC
  Administered 2014-04-11: 15 mL via OROMUCOSAL
  Filled 2014-04-11: qty 15

## 2014-04-11 MED ORDER — SODIUM CHLORIDE 0.9 % IJ SOLN
3.0000 mL | Freq: Two times a day (BID) | INTRAMUSCULAR | Status: DC
  Administered 2014-04-12 – 2014-04-13 (×3): 3 mL via INTRAVENOUS

## 2014-04-11 MED ORDER — 0.9 % SODIUM CHLORIDE (POUR BTL) OPTIME
TOPICAL | Status: DC | PRN
  Administered 2014-04-11: 5000 mL

## 2014-04-11 MED ORDER — CETYLPYRIDINIUM CHLORIDE 0.05 % MT LIQD
7.0000 mL | Freq: Four times a day (QID) | OROMUCOSAL | Status: DC
  Administered 2014-04-11 – 2014-04-12 (×3): 7 mL via OROMUCOSAL

## 2014-04-11 MED ORDER — PHENYLEPHRINE 40 MCG/ML (10ML) SYRINGE FOR IV PUSH (FOR BLOOD PRESSURE SUPPORT)
PREFILLED_SYRINGE | INTRAVENOUS | Status: AC
  Filled 2014-04-11: qty 10

## 2014-04-11 MED ORDER — SODIUM CHLORIDE 0.9 % IV SOLN
INTRAVENOUS | Status: DC
  Filled 2014-04-11: qty 2.5

## 2014-04-11 MED ORDER — ONDANSETRON HCL 4 MG/2ML IJ SOLN: 4.0000 mg | Freq: Four times a day (QID) | INTRAMUSCULAR | Status: DC | PRN

## 2014-04-11 MED ORDER — TRAMADOL HCL 50 MG PO TABS
50.0000 mg | ORAL_TABLET | ORAL | Status: DC | PRN
  Administered 2014-04-11: 100 mg via ORAL
  Filled 2014-04-11: qty 2

## 2014-04-11 MED ORDER — MUPIROCIN 2 % EX OINT
TOPICAL_OINTMENT | CUTANEOUS | Status: AC
  Administered 2014-04-11: 1 via TOPICAL
  Filled 2014-04-11: qty 22

## 2014-04-11 MED ORDER — DEXMEDETOMIDINE HCL IN NACL 200 MCG/50ML IV SOLN: 0.0000 ug/kg/h | INTRAVENOUS | Status: DC

## 2014-04-11 MED ORDER — ROCURONIUM BROMIDE 100 MG/10ML IV SOLN
INTRAVENOUS | Status: DC | PRN
  Administered 2014-04-11: 50 mg via INTRAVENOUS
  Administered 2014-04-11: 60 mg via INTRAVENOUS
  Administered 2014-04-11: 40 mg via INTRAVENOUS

## 2014-04-11 MED ORDER — ARTIFICIAL TEARS OP OINT
TOPICAL_OINTMENT | OPHTHALMIC | Status: AC
  Filled 2014-04-11: qty 3.5

## 2014-04-11 MED ORDER — METOPROLOL TARTRATE 12.5 MG HALF TABLET
12.5000 mg | ORAL_TABLET | Freq: Once | ORAL | Status: AC
  Administered 2014-04-11: 12.5 mg via ORAL

## 2014-04-11 MED ORDER — ACETAMINOPHEN 650 MG RE SUPP
650.0000 mg | Freq: Once | RECTAL | Status: AC
  Administered 2014-04-11: 650 mg via RECTAL

## 2014-04-11 MED ORDER — MIDAZOLAM HCL 5 MG/5ML IJ SOLN
INTRAMUSCULAR | Status: DC | PRN
  Administered 2014-04-11 (×5): 2 mg via INTRAVENOUS

## 2014-04-11 MED ORDER — ACETAMINOPHEN 160 MG/5ML PO SOLN: 1000.0000 mg | Freq: Four times a day (QID) | ORAL | Status: DC

## 2014-04-11 MED ORDER — VANCOMYCIN HCL IN DEXTROSE 1-5 GM/200ML-% IV SOLN
1000.0000 mg | Freq: Once | INTRAVENOUS | Status: AC
  Administered 2014-04-11: 1000 mg via INTRAVENOUS
  Filled 2014-04-11: qty 200

## 2014-04-11 MED ORDER — METOPROLOL TARTRATE 25 MG/10 ML ORAL SUSPENSION
12.5000 mg | Freq: Two times a day (BID) | ORAL | Status: DC
  Filled 2014-04-11 (×3): qty 5

## 2014-04-11 MED ORDER — ASPIRIN 81 MG PO CHEW: 324.0000 mg | CHEWABLE_TABLET | Freq: Every day | ORAL | Status: DC

## 2014-04-11 MED ORDER — OXYCODONE HCL 5 MG PO TABS
5.0000 mg | ORAL_TABLET | ORAL | Status: DC | PRN
  Administered 2014-04-12: 10 mg via ORAL
  Administered 2014-04-12: 5 mg via ORAL
  Filled 2014-04-11: qty 1
  Filled 2014-04-11: qty 2

## 2014-04-11 MED ORDER — THROMBIN 20000 UNITS EX SOLR
CUTANEOUS | Status: DC | PRN
  Administered 2014-04-11 (×3): 4 mL via TOPICAL

## 2014-04-11 MED ORDER — MIDAZOLAM HCL 10 MG/2ML IJ SOLN
INTRAMUSCULAR | Status: AC
  Filled 2014-04-11: qty 2

## 2014-04-11 MED ORDER — PROTAMINE SULFATE 10 MG/ML IV SOLN
INTRAVENOUS | Status: AC
  Filled 2014-04-11: qty 25

## 2014-04-11 MED ORDER — ROCURONIUM BROMIDE 50 MG/5ML IV SOLN
INTRAVENOUS | Status: AC
  Filled 2014-04-11: qty 2

## 2014-04-11 MED ORDER — BISACODYL 10 MG RE SUPP: 10.0000 mg | Freq: Every day | RECTAL | Status: DC

## 2014-04-11 MED ORDER — LACTATED RINGERS IV SOLN
500.0000 mL | Freq: Once | INTRAVENOUS | Status: AC | PRN
Start: 2014-04-11 — End: 2014-04-11

## 2014-04-11 MED ORDER — POTASSIUM CHLORIDE 10 MEQ/50ML IV SOLN
10.0000 meq | INTRAVENOUS | Status: AC
  Administered 2014-04-11 (×3): 10 meq via INTRAVENOUS

## 2014-04-11 MED ORDER — FAMOTIDINE IN NACL 20-0.9 MG/50ML-% IV SOLN
20.0000 mg | Freq: Two times a day (BID) | INTRAVENOUS | Status: AC
  Administered 2014-04-11: 20 mg via INTRAVENOUS

## 2014-04-11 MED ORDER — SODIUM CHLORIDE 0.45 % IV SOLN
INTRAVENOUS | Status: DC
  Administered 2014-04-11: 10 mL/h via INTRAVENOUS

## 2014-04-11 MED ORDER — MUPIROCIN 2 % EX OINT
1.0000 "application " | TOPICAL_OINTMENT | Freq: Once | CUTANEOUS | Status: AC
  Administered 2014-04-11: 1 via TOPICAL

## 2014-04-11 MED ORDER — FENTANYL CITRATE 0.05 MG/ML IJ SOLN
INTRAMUSCULAR | Status: DC | PRN
  Administered 2014-04-11: 100 ug via INTRAVENOUS
  Administered 2014-04-11: 200 ug via INTRAVENOUS
  Administered 2014-04-11: 150 ug via INTRAVENOUS
  Administered 2014-04-11: 100 ug via INTRAVENOUS
  Administered 2014-04-11: 250 ug via INTRAVENOUS
  Administered 2014-04-11: 100 ug via INTRAVENOUS
  Administered 2014-04-11: 150 ug via INTRAVENOUS
  Administered 2014-04-11 (×2): 100 ug via INTRAVENOUS

## 2014-04-11 MED ORDER — SODIUM CHLORIDE 0.9 % IJ SOLN: 3.0000 mL | INTRAMUSCULAR | Status: DC | PRN

## 2014-04-11 MED ORDER — MORPHINE SULFATE 2 MG/ML IJ SOLN
1.0000 mg | INTRAMUSCULAR | Status: AC | PRN
  Filled 2014-04-11: qty 2

## 2014-04-11 MED ORDER — LIDOCAINE HCL (CARDIAC) 20 MG/ML IV SOLN
INTRAVENOUS | Status: DC | PRN
  Administered 2014-04-11: 60 mg via INTRAVENOUS

## 2014-04-11 MED ORDER — LACTATED RINGERS IV SOLN: INTRAVENOUS | Status: DC

## 2014-04-11 MED ORDER — PANTOPRAZOLE SODIUM 40 MG PO TBEC: 40.0000 mg | DELAYED_RELEASE_TABLET | Freq: Every day | ORAL | Status: DC

## 2014-04-11 MED ORDER — ACETAMINOPHEN 160 MG/5ML PO SOLN: 650.0000 mg | Freq: Once | ORAL | Status: AC

## 2014-04-11 MED ORDER — SODIUM CHLORIDE 0.9 % IJ SOLN
INTRAMUSCULAR | Status: AC
  Filled 2014-04-11: qty 20

## 2014-04-11 MED ORDER — HEMOSTATIC AGENTS (NO CHARGE) OPTIME
TOPICAL | Status: DC | PRN
  Administered 2014-04-11: 1 via TOPICAL

## 2014-04-11 MED ORDER — MORPHINE SULFATE 2 MG/ML IJ SOLN
2.0000 mg | INTRAMUSCULAR | Status: DC | PRN
  Administered 2014-04-12: 2 mg via INTRAVENOUS
  Filled 2014-04-11: qty 1

## 2014-04-11 MED ORDER — MIDAZOLAM HCL 2 MG/2ML IJ SOLN: 2.0000 mg | INTRAMUSCULAR | Status: DC | PRN

## 2014-04-11 MED ORDER — SODIUM CHLORIDE 0.9 % IV SOLN
INTRAVENOUS | Status: DC | PRN
  Administered 2014-04-11: 11:00:00 via INTRAVENOUS

## 2014-04-11 MED ORDER — HEPARIN SODIUM (PORCINE) 1000 UNIT/ML IJ SOLN
INTRAMUSCULAR | Status: DC | PRN
  Administered 2014-04-11: 24000 [IU] via INTRAVENOUS

## 2014-04-11 MED ORDER — PROPOFOL 10 MG/ML IV BOLUS
INTRAVENOUS | Status: AC
  Filled 2014-04-11: qty 20

## 2014-04-11 MED ORDER — HEPARIN SODIUM (PORCINE) 1000 UNIT/ML IJ SOLN
INTRAMUSCULAR | Status: AC
  Filled 2014-04-11: qty 1

## 2014-04-11 MED ORDER — LACTATED RINGERS IV SOLN
INTRAVENOUS | Status: DC | PRN
  Administered 2014-04-11 (×2): via INTRAVENOUS

## 2014-04-11 MED ORDER — ALBUMIN HUMAN 5 % IV SOLN
250.0000 mL | INTRAVENOUS | Status: AC | PRN
  Administered 2014-04-11 – 2014-04-12 (×4): 250 mL via INTRAVENOUS
  Filled 2014-04-11 (×2): qty 250

## 2014-04-11 MED ORDER — ACETAMINOPHEN 500 MG PO TABS
1000.0000 mg | ORAL_TABLET | Freq: Four times a day (QID) | ORAL | Status: DC
  Administered 2014-04-11 – 2014-04-13 (×7): 1000 mg via ORAL
  Filled 2014-04-11 (×10): qty 2

## 2014-04-11 MED ORDER — SODIUM BICARBONATE 8.4 % IV SOLN
50.0000 meq | Freq: Once | INTRAVENOUS | Status: AC
  Administered 2014-04-11: 50 meq via INTRAVENOUS

## 2014-04-11 MED ORDER — ROCURONIUM BROMIDE 50 MG/5ML IV SOLN
INTRAVENOUS | Status: AC
  Filled 2014-04-11: qty 1

## 2014-04-11 MED ORDER — METOPROLOL TARTRATE 12.5 MG HALF TABLET
ORAL_TABLET | ORAL | Status: AC
  Administered 2014-04-11: 12.5 mg via ORAL
  Filled 2014-04-11: qty 1

## 2014-04-11 MED ORDER — MAGNESIUM SULFATE 4 GM/100ML IV SOLN
4.0000 g | Freq: Once | INTRAVENOUS | Status: AC
  Administered 2014-04-11: 4 g via INTRAVENOUS
  Filled 2014-04-11: qty 100

## 2014-04-11 MED ORDER — PHENYLEPHRINE HCL 10 MG/ML IJ SOLN
0.0000 ug/min | INTRAVENOUS | Status: DC
  Administered 2014-04-12: 30 ug/min via INTRAVENOUS
  Filled 2014-04-11 (×2): qty 2

## 2014-04-11 MED ORDER — DEXTROSE 5 % IV SOLN
1.5000 g | Freq: Two times a day (BID) | INTRAVENOUS | Status: AC
  Administered 2014-04-11 – 2014-04-13 (×4): 1.5 g via INTRAVENOUS
  Filled 2014-04-11 (×4): qty 1.5

## 2014-04-11 MED ORDER — SODIUM CHLORIDE 0.9 % IV SOLN: INTRAVENOUS | Status: DC

## 2014-04-11 MED ORDER — METOPROLOL TARTRATE 12.5 MG HALF TABLET
12.5000 mg | ORAL_TABLET | Freq: Two times a day (BID) | ORAL | Status: DC
  Filled 2014-04-11 (×3): qty 1

## 2014-04-11 MED ORDER — NITROGLYCERIN IN D5W 200-5 MCG/ML-% IV SOLN: 0.0000 ug/min | INTRAVENOUS | Status: DC

## 2014-04-11 MED ORDER — EPHEDRINE SULFATE 50 MG/ML IJ SOLN
INTRAMUSCULAR | Status: DC | PRN
  Administered 2014-04-11 (×2): 10 mg via INTRAVENOUS

## 2014-04-11 MED ORDER — METOPROLOL TARTRATE 1 MG/ML IV SOLN: 2.5000 mg | INTRAVENOUS | Status: DC | PRN

## 2014-04-11 SURGICAL SUPPLY — 67 items
ADAPTER CARDIO PERF ANTE/RETRO (ADAPTER) ×3 IMPLANT
ADPR PRFSN 84XANTGRD RTRGD (ADAPTER) ×2
ATTRACTOMAT 16X20 MAGNETIC DRP (DRAPES) IMPLANT
BAG DECANTER FOR FLEXI CONT (MISCELLANEOUS) ×3 IMPLANT
BLADE STERNUM SYSTEM 6 (BLADE) ×3 IMPLANT
BLADE SURG 15 STRL LF DISP TIS (BLADE) ×2 IMPLANT
BLADE SURG 15 STRL SS (BLADE) ×3
CANISTER SUCTION 2500CC (MISCELLANEOUS) ×3 IMPLANT
CANNULA GUNDRY RCSP 15FR (MISCELLANEOUS) ×3 IMPLANT
CATH HEART VENT LEFT (CATHETERS) ×2 IMPLANT
CATH ROBINSON RED A/P 18FR (CATHETERS) ×9 IMPLANT
CATH THORACIC 36FR (CATHETERS) ×3 IMPLANT
CATH THORACIC 36FR RT ANG (CATHETERS) ×3 IMPLANT
CONN Y 3/8X3/8X3/8  BEN (MISCELLANEOUS) ×1
CONN Y 3/8X3/8X3/8 BEN (MISCELLANEOUS) ×2 IMPLANT
CONT SPEC STER OR (MISCELLANEOUS) ×3 IMPLANT
COVER SURGICAL LIGHT HANDLE (MISCELLANEOUS) IMPLANT
CRADLE DONUT ADULT HEAD (MISCELLANEOUS) ×3 IMPLANT
DRAPE SLUSH/WARMER DISC (DRAPES) ×3 IMPLANT
DRSG COVADERM 4X14 (GAUZE/BANDAGES/DRESSINGS) ×3 IMPLANT
ELECT CAUTERY BLADE 6.4 (BLADE) ×3 IMPLANT
ELECT REM PT RETURN 9FT ADLT (ELECTROSURGICAL) ×6
ELECTRODE REM PT RTRN 9FT ADLT (ELECTROSURGICAL) ×4 IMPLANT
GAUZE SPONGE 4X4 12PLY STRL (GAUZE/BANDAGES/DRESSINGS) ×3 IMPLANT
GLOVE BIO SURGEON STRL SZ 6 (GLOVE) ×12 IMPLANT
GLOVE BIO SURGEON STRL SZ 6.5 (GLOVE) ×6 IMPLANT
GLOVE BIO SURGEON STRL SZ7 (GLOVE) IMPLANT
GLOVE BIO SURGEON STRL SZ7.5 (GLOVE) IMPLANT
GLOVE EUDERMIC 7 POWDERFREE (GLOVE) ×6 IMPLANT
GOWN STRL REUS W/ TWL LRG LVL3 (GOWN DISPOSABLE) ×16 IMPLANT
GOWN STRL REUS W/ TWL XL LVL3 (GOWN DISPOSABLE) ×2 IMPLANT
GOWN STRL REUS W/TWL LRG LVL3 (GOWN DISPOSABLE) ×24
GOWN STRL REUS W/TWL XL LVL3 (GOWN DISPOSABLE) ×3
HEART VENT LT CURVED (MISCELLANEOUS) IMPLANT
HEMOSTAT POWDER SURGIFOAM 1G (HEMOSTASIS) ×9 IMPLANT
HEMOSTAT SURGICEL 2X14 (HEMOSTASIS) ×3 IMPLANT
KIT BASIN OR (CUSTOM PROCEDURE TRAY) ×3 IMPLANT
KIT CATH CPB BARTLE (MISCELLANEOUS) ×3 IMPLANT
KIT ROOM TURNOVER OR (KITS) ×3 IMPLANT
KIT SUCTION CATH 14FR (SUCTIONS) ×3 IMPLANT
LINE VENT (MISCELLANEOUS) ×3 IMPLANT
NS IRRIG 1000ML POUR BTL (IV SOLUTION) ×18 IMPLANT
PACK OPEN HEART (CUSTOM PROCEDURE TRAY) ×3 IMPLANT
PAD ARMBOARD 7.5X6 YLW CONV (MISCELLANEOUS) ×6 IMPLANT
SET CARDIOPLEGIA MPS 5001102 (MISCELLANEOUS) ×3 IMPLANT
SUT BONE WAX W31G (SUTURE) ×3 IMPLANT
SUT ETHIBON 2 0 V 52N 30 (SUTURE) ×6 IMPLANT
SUT PROLENE 3 0 SH DA (SUTURE) IMPLANT
SUT PROLENE 3 0 SH1 36 (SUTURE) ×3 IMPLANT
SUT PROLENE 4 0 RB 1 (SUTURE) ×12
SUT PROLENE 4-0 RB1 .5 CRCL 36 (SUTURE) ×8 IMPLANT
SUT SILK  1 MH (SUTURE) ×2
SUT SILK 1 MH (SUTURE) ×4 IMPLANT
SUT STEEL 6MS V (SUTURE) IMPLANT
SUT VIC AB 1 CTX 36 (SUTURE) ×6
SUT VIC AB 1 CTX36XBRD ANBCTR (SUTURE) ×4 IMPLANT
SUT VIC AB 3-0 X1 27 (SUTURE) ×9 IMPLANT
SUTURE E-PAK OPEN HEART (SUTURE) ×3 IMPLANT
SYSTEM SAHARA CHEST DRAIN ATS (WOUND CARE) ×3 IMPLANT
TAPE CLOTH SURG 4X10 WHT LF (GAUZE/BANDAGES/DRESSINGS) ×3 IMPLANT
TOWEL OR 17X24 6PK STRL BLUE (TOWEL DISPOSABLE) ×6 IMPLANT
TOWEL OR 17X26 10 PK STRL BLUE (TOWEL DISPOSABLE) ×6 IMPLANT
TRAY FOLEY IC TEMP SENS 16FR (CATHETERS) ×3 IMPLANT
UNDERPAD 30X30 INCONTINENT (UNDERPADS AND DIAPERS) ×3 IMPLANT
VALVE MAGNA EASE AORTIC 23MM (Prosthesis & Implant Heart) ×3 IMPLANT
VENT LEFT HEART 12002 (CATHETERS) ×3
WATER STERILE IRR 1000ML POUR (IV SOLUTION) ×6 IMPLANT

## 2014-04-11 NOTE — Procedures (Signed)
Extubation Procedure Note  Patient Details:   Name: Frederick Burnett DOB: Jul 12, 1940 MRN: [id number]   Airway Documentation:     Evaluation  O2 sats: stable throughout Complications: No apparent complications Patient did tolerate procedure well. Bilateral Breath Sounds: Clear Suctioning: Airway Yes   Patient extubated to 2L nasal cannula per rapid wean protocol.  Sats currently 100%.  Positive cuff leak noted.  No evidence of stridor.  Patient able to speak post extubation.  Vitals are stable.  Incentive spirometry attempted, however patient still slightly drowsy.  No apparent complications.  [name] 04/11/2014, 3:54 PM

## 2014-04-11 NOTE — Anesthesia Postprocedure Evaluation (Signed)
  Anesthesia Post-op Note  Patient: Frederick Burnett  Procedure(s) Performed: Procedure(s): AORTIC VALVE REPLACEMENT (AVR) (N/A) TRANSESOPHAGEAL ECHOCARDIOGRAM (TEE) (N/A)  Patient Location: ICU  Anesthesia Type:General  Level of Consciousness: sedated  Airway and Oxygen Therapy: Patient remains intubated per anesthesia plan  Post-op Pain: none  Post-op Assessment: Post-op Vital signs reviewed, Patient's Cardiovascular Status Stable, Respiratory Function Stable and Patent Airway  Post-op Vital Signs: Reviewed and stable  Last Vitals:  Filed Vitals:   04/11/14 1330  BP:   Pulse: 88  Temp: 36.8 C  Resp: 12    Complications: No apparent anesthesia complications

## 2014-04-11 NOTE — H&P (Signed)
[address]       [place] [address]             [phone]       Cardiothoracic Surgery History and Physical   PCP is Donnie Coffin, MD Referring Provider is Sinclair Grooms, MD  Chief Complaint  Patient presents with  . Aortic Insuffiency    and AORTIC REGURG...cath 02/22/14, TEE 03/24/14...ASYMPTOMATIC    HPI:  The patient is a 74 year old gentleman with HTN and hypercholesterolemia who drives a school bus and says that he was having a physical to maintain his license and was noted to have a murmur. He was seen by Dr. Daneen Schick in 12/2012. He had moderate to severe AI by echo with no heart failure symptoms. The aortic root was enlarged by echo but a CTA on 01/05/2013 showed a normal caliber aorta with a diameter of 3.6 cm at its greatest diameter in the mid ascending aorta. He was followed with echo and his most recent study on 03/24/2014 showed that the LV was mildly dilated and there was moderate LV dysfunction with a drop in the EF to 35-40% compared to the echo ib 11/25/2013 when the EF was 40-45%. The aortic valve leaflets are mildly thickened and not coapting with severe AI. There is mild to moderate central MR due to leaflet tethering from LV dilatation. Cardiac cath on 02/22/2014 showed severe AI with mild aortic root enlargement. There was moderate MR that is probably due to leaflet tethering and the catheter. There was no coronary disease. The ascending aorta appeared the same on TEE at 37 mm. He says that he feels fine and denies any symptoms although he says he doesn't overdo it.   Past Medical History  Diagnosis Date  . Hypercholesteremia   . HTN (hypertension)   . Aortic regurgitation   . Aortic root enlargement   . Aortic regurgitation     Past Surgical History  Procedure Laterality Date  . Multiple tooth extractions    . Left and right heart catheterization with coronary angiogram N/A  02/22/2014    Procedure: LEFT AND RIGHT HEART CATHETERIZATION WITH CORONARY ANGIOGRAM; Surgeon: Sinclair Grooms, MD; Location: [facility] CATH LAB; Service: Cardiovascular; Laterality: N/A;  . Cardiac catheterization    . Tee without cardioversion N/A 03/24/2014    Procedure: TRANSESOPHAGEAL ECHOCARDIOGRAM (TEE); Surgeon: Dorothy Spark, MD; Location: [facility] ENDOSCOPY; Service: Cardiovascular; Laterality: N/A;    Family History  Problem Relation Age of Onset  . Hypertension Mother     Social History History  Substance Use Topics  . Smoking status: Former Smoker    Types: Cigars    Quit date: 05/29/2001  . Smokeless tobacco: Not on file  . Alcohol Use: No    Current Outpatient Prescriptions  Medication Sig Dispense Refill  . hydrochlorothiazide (HYDRODIURIL) 25 MG tablet Take 25 mg by mouth daily.    . Multiple Vitamin (MULTIVITAMIN) capsule Take 1 capsule by mouth daily.    . potassium chloride (K-DUR,KLOR-CON) 10 MEQ tablet Take 10 mEq by mouth daily.      No current facility-administered medications for this visit.    Not on File  Review of Systems  Constitutional: Negative for fever, activity change, fatigue and unexpected weight change.  HENT: Negative.  Eyes: Negative.  Respiratory: Negative for cough, shortness of breath and wheezing.  Cardiovascular: Negative for chest pain, palpitations and leg swelling.  Gastrointestinal: Negative.  Endocrine: Negative.  Genitourinary: Negative.  Musculoskeletal: Negative.  Skin: Negative.  Allergic/Immunologic: Negative.  Neurological: Negative.  Hematological: Negative.  Psychiatric/Behavioral: Negative.    BP 138/74 mmHg  Pulse 91  Resp 16  Ht 5\' 9"  (1.753 m)  Wt 189 lb (85.73 kg)  BMI 27.90 kg/m2  SpO2 98% Physical Exam  Constitutional: He is oriented to person, place, and time. He appears well-developed and well-nourished. No  distress.  HENT:  Head: Normocephalic and atraumatic.  Mouth/Throat: Oropharynx is clear and moist.  Eyes: EOM are normal. Pupils are equal, round, and reactive to light.  Neck: Normal range of motion. Neck supple. No JVD present. No thyromegaly present.  Cardiovascular: Normal rate and regular rhythm. Exam reveals no gallop and no friction rub.  Murmur heard.  2/6 systolic murmur over aorta 2/6 decrescendo diastolic murmur over aorta to apex  Pulmonary/Chest: Effort normal and breath sounds normal. No respiratory distress. He has no rales.  Abdominal: Soft. Bowel sounds are normal. He exhibits no distension and no mass. There is no tenderness.  Musculoskeletal: Normal range of motion. He exhibits no edema.  Lymphadenopathy:   He has no cervical adenopathy.  Neurological: He is alert and oriented to person, place, and time. He has normal strength. No cranial nerve deficit or sensory deficit.  Skin: Skin is warm and dry.  Psychiatric: He has a normal mood and affect.     Diagnostic Tests:            *[facility]*            [place], [place] [address]              [phone]  ------------------------------------------------------------------- Transesophageal Echocardiography  Patient:  [place], Rames MR #:    [id number] Study Date: 03/24/2014 Gender:   M Age:    27 Height:   175.3 cm Weight:   84.1 kg BSA:    2.04 m^2 Pt. Status: Room:  ADMITTING  Sinclair Grooms ATTENDING  Sinclair Grooms SONOGRAPHER [facility] ORDERING   Ena Dawley, M.D. PERFORMING  Ena Dawley, M.D.  cc:  ------------------------------------------------------------------- LV EF: 35% -  40%  ------------------------------------------------------------------- Indications:   Aortic insufficiency  424.1.  ------------------------------------------------------------------- Study Conclusions  - Left ventricle: The cavity size was mildly dilated. There was mild concentric hypertrophy. Systolic function was moderately reduced. The estimated ejection fraction was in the range of 35% to 40%. Diffuse hypokinesis. - Aortic valve: Aortic valve leaflets are mildly thickened and non-coapting. There is severe aortic regurgitation and no stenosis. Vena contracta is > 10 mm. Aortic insufficiency jet fill almost the entire LVOT. - Ascending aorta: The ascending aorta was normal in size measuring 37 mm. This might be underestimated on TEE. For accurate measurement consider CT. - Descending aorta: The descending aorta is mildly dilated measuring 29 mm. - Mitral valve: Mitral valve leaflets are structurally normal. The leaflets are thethered by LV dilatation resulting in a central jet with mild to moderate mitral regurgitation. - Left atrium: The atrium was normal in size. No evidence of thrombus in the atrial cavity or appendage. No evidence of thrombus in the atrial cavity or appendage. No evidence of thrombus in the appendage. - Right ventricle: Systolic function was normal. - Right atrium: No evidence of thrombus in the atrial cavity or appendage. No evidence of thrombus in the atrial cavity or appendage. -  Atrial septum: No defect or patent foramen ovale was identified. Echo contrast study showed no right-to-left atrial level shunt, following an increase in RA pressure induced by provocative maneuvers. - Tricuspid valve: There was trivial regurgitation. - Pulmonic valve: No evidence of vegetation.  Impressions:  - Mildly dilated LV with moderately impaired systolic function, LVEF 73-53%. Mild to moderate functional MR. Severe AI with upper normal aortic root size and normal size ascending aorta. This might be underestimated on TEE  study, for more accurate double oblique measurement consider a CT.  Diagnostic transesophageal echocardiography. 2D and color Doppler. Birthdate: Patient birthdate: [birth year]. Age: Patient is 74 yr old. Sex: Gender: male.  BMI: 27.4 kg/m^2. Blood pressure: 126/77 Patient status: Outpatient. Study date: Study date: 03/24/2014. Study time: 09:02 AM. Location: Endoscopy.  -------------------------------------------------------------------  ------------------------------------------------------------------- Left ventricle: The cavity size was mildly dilated. There was mild concentric hypertrophy. Systolic function was moderately reduced. The estimated ejection fraction was in the range of 35% to 40%. Diffuse hypokinesis.  ------------------------------------------------------------------- Aortic valve: Aortic valve leaflets are mildly thickened and non-coapting. There is severe aortic regurgitation and no stenosis. Vena contracta is > 10 mm. Aortic insufficiency jet fill almost the entire LVOT. Structurally normal valve. Trileaflet; normal thickness leaflets.  ------------------------------------------------------------------- Aorta: There was mild non-mobile atheroma. There was no evidence for dissection. Aortic root: The aortic root measures 37 mm in its widest diameter. Ascending aorta: The ascending aorta was normal in size measuring 37 mm. This might be underestimated on TEE. For accurate measurement consider CT. Descending aorta: The descending aorta is mildly dilated measuring 29 mm.  ------------------------------------------------------------------- Mitral valve: Mitral valve leaflets are structurally normal. The leaflets are thethered by LV dilatation resulting in a central jet with mild to moderate mitral regurgitation. Leaflet separation was normal.  ------------------------------------------------------------------- Left atrium: The atrium  was normal in size. No evidence of thrombus in the atrial cavity or appendage. No evidence of thrombus in the atrial cavity or appendage. No evidence of thrombus in the appendage. The appendage was morphologically a left appendage, multilobulated, and of normal size. Emptying velocity was normal.  ------------------------------------------------------------------- Atrial septum: No defect or patent foramen ovale was identified. Echo contrast study showed no right-to-left atrial level shunt, following an increase in RA pressure induced by provocative maneuvers.  ------------------------------------------------------------------- Right ventricle: The cavity size was normal. Wall thickness was normal. Systolic function was normal.  ------------------------------------------------------------------- Pulmonic valve:  Structurally normal valve.  Cusp separation was normal. No evidence of vegetation.  ------------------------------------------------------------------- Tricuspid valve:  Structurally normal valve.  Leaflet separation was normal. Doppler: There was trivial regurgitation.  ------------------------------------------------------------------- Pulmonary artery:  The main pulmonary artery was normal-sized.  ------------------------------------------------------------------- Right atrium: The atrium was normal in size. No evidence of thrombus in the atrial cavity or appendage. No evidence of thrombus in the atrial cavity or appendage. The appendage was morphologically a right appendage.  ------------------------------------------------------------------- Pericardium: There was no pericardial effusion.  ------------------------------------------------------------------- Prepared and Electronically Authenticated by  Ena Dawley, M.D. 2016-01-28T11:03:56   CLINICAL DATA: Aortic regurgitation, thoracic aortic enlargement  EXAM: CT ANGIOGRAPHY CHEST  WITH CONTRAST  TECHNIQUE: Multidetector CT imaging of the chest was performed using the standard protocol during bolus administration of intravenous contrast. Multiplanar CT image reconstructions including MIPs were obtained to evaluate the vascular anatomy.  CONTRAST: 157mL OMNIPAQUE IOHEXOL 350 MG/ML SOLN  COMPARISON: None.  FINDINGS: Thoracic aorta is normal in caliber throughout its length. Minimal scattered atheromatous calcified plaque in the aortic arch. No mural thrombus. No aneurysm, dissection, or stenosis. Classic 3 vessel  brachiocephalic arterial origin anatomy without proximal stenosis. There is fairly good contrast opacification of pulmonary artery branches; the exam was not optimized for detection of pulmonary emboli. No pleural or pericardial effusion. No hilar or mediastinal adenopathy. Dependent atelectasis posteriorly in both lower lobes. Small bleb in the anterior segment left upper lobe. Lungs otherwise clear. Thoracic spine and sternum intact. Visualized portions of upper abdomen unremarkable.  Review of the MIP images confirms the above findings.  IMPRESSION: 1. Mild atheromatous plaque in the aortic arch without an aneurysm, dissection, or stenosis.   Electronically Signed  By: Arne Cleveland M.D.  On: 01/05/2013 11:02    Left and Right Heart Catheterization with Coronary Angiography Report  MENASHE KAFER  74 y.o.  male 1940/08/24  Procedure Date: 02/22/2014 Referring Physician: Donnie Coffin, M.D. Primary Cardiologist:: H WB Blenda Bridegroom, M.D.  INDICATIONS: Severe aortic regurgitation with progressive left ventricular enlargement and decrease portion year over a year.  PROCEDURE: 1. Left heart cath; 2. Right heart cath; 3. Left ventriculography; 4. Aortography; 5. Coronary angiography  CONSENT:  The risks, benefits, and details of the procedure were explained in detail to the patient. Risks including death, stroke, heart  attack, kidney injury, allergy, limb ischemia, bleeding and radiation injury were discussed. The patient verbalized understanding and wanted to proceed. Informed written consent was obtained.  PROCEDURE TECHNIQUE: After Xylocaine anesthesia a 5 French sheath was placed in the right femoral artery and a 7 French sheath in the right femoral vein both using the modified Seldinger technique. 2% Xylocaine was used for local anesthesia. Coronary angiography was done using a 5 F A2 multipurpose catheter. Left ventriculography was done using a straight 5 French pigtail catheter and aortography was performed using the same catheter with power injection.   Hemostasis was achieved by manual compression  CONTRAST: Total of 125 cc.  COMPLICATIONS: None   HEMODYNAMICS: Aortic pressure 101/53 mmHg; LV pressure 106 /1 mmHg; LVEDP 7 mmHg; RA 4 mmHg mean; RV 24 /6 mmHg; PA 25/11 mmHg; PCWP(mean) 12 mmHg with V-wave to 19 mmHg; Cardiac Output 6.35 L/m; AV gradient none  ANGIOGRAPHIC DATA: The left main coronary artery is widely patent.  The left anterior descending artery is widely patent but reveals proximal and mid luminal irregularities.  The left circumflex artery is widely patent and contains 50% mid vessel stenosis after the first obtuse marginal branch. Beyond this region 2 moderate-sized diagonals arise..  The right coronary artery is patent. There are proximal and mid irregularities. The vessel is dominant.  SUPRAVALVULAR AORTIC ANGIOGRAPHY: Severe aortic regurgitation with aortic root enlargement  LEFT VENTRICULOGRAM: Left ventricular angiogram was done in the 30 RAO projection and revealed a dilated left ventricle, global hypokinesis with an estimated EF of 35-40%. Moderate mitral regurgitation is noted.   IMPRESSIONS: 1. Severe aortic regurgitation with moderate aortic root enlargement 2. Moderate mitral regurgitation, hopefully related to LV enlargement and not primary valvular  abnormality 3. Global left ventricular hypokinesis with an estimated ejection fraction of 35-40% 4. Widely patent coronary arteries 5. Normal pulmonary pressures and normal left heart filling pressures.   RECOMMENDATION: Patient will need to be set up for a transesophageal echo The patient will be referred to Dr. Gilford Raid.    Impression:   I have personally reviewed his TEE, cardiac cath and CT scan.  He has severe aortic insufficiency with mild LV dilatation and progressive LV dysfunction. He is asymptomatic but with LV deterioration it is time to do surgery. His aorta is minimally enlarged by  TEE and about the same size that it was by CT scan in 12/2012. I don't think this needs to be replaced in this 74 year old with a trileaflet aortic valve. He has mild to moderate MR. I reviewed his TEE and I think it is mild central MR due to leaflet tethering from LV dilatation. The valve looks structurally normal. This will usually get better with AVR as the ventricle is less stressed and decreases in size. I would plan to do AVR using a pericardial tissue valve. I discussed the operative procedure with the patient and his son including alternatives, benefits and risks; including but not limited to bleeding, blood transfusion, infection, stroke, myocardial infarction, graft failure, heart block requiring a permanent pacemaker, organ dysfunction, and death. Pierce Crane Beahm understands and agrees to proceed. We will schedule surgery for 04/11/2014.  Plan:  AVR using a tissue valve on 04/11/2014.

## 2014-04-11 NOTE — Progress Notes (Signed)
UR Completed.  [phone]  

## 2014-04-11 NOTE — Transfer of Care (Signed)
Immediate Anesthesia Transfer of Care Note  Patient: [place]  Procedure(s) Performed: Procedure(s): AORTIC VALVE REPLACEMENT (AVR) (N/A) TRANSESOPHAGEAL ECHOCARDIOGRAM (TEE) (N/A)  Patient Location: ICU  Anesthesia Type:General  Level of Consciousness: Patient remains intubated per anesthesia plan  Airway & Oxygen Therapy: Patient remains intubated per anesthesia plan and Patient placed on Ventilator (see vital sign flow sheet for setting)  Post-op Assessment: Report given to RN  Post vital signs: Reviewed and stable  Last Vitals:  Filed Vitals:   04/11/14 0722  BP: 163/65  Pulse: 77  Temp: 36.7 C  Resp: 18    Complications: No apparent anesthesia complications

## 2014-04-11 NOTE — Progress Notes (Signed)
Dr. Arvid Right notified that patient received 1 dose of mupirocin.

## 2014-04-11 NOTE — Interval H&P Note (Signed)
History and Physical Interval Note:  04/11/2014 7:15 AM  [place]  has presented today for surgery, with the diagnosis of SEVERE AI  The various methods of treatment have been discussed with the patient and family. After consideration of risks, benefits and other options for treatment, the patient has consented to  Procedure(s): AORTIC VALVE REPLACEMENT (AVR) (N/A) TRANSESOPHAGEAL ECHOCARDIOGRAM (TEE) (N/A) as a surgical intervention .  The patient's history has been reviewed, patient examined, no change in status, stable for surgery.  I have reviewed the patient's chart and labs.  Questions were answered to the patient's satisfaction.     Gaye Pollack

## 2014-04-11 NOTE — Anesthesia Preprocedure Evaluation (Signed)
Anesthesia Evaluation  Patient identified by MRN, date of birth, ID band Patient awake    Reviewed: Allergy & Precautions, NPO status , Patient's Chart, lab work & pertinent test results  History of Anesthesia Complications Negative for: history of anesthetic complications  Airway Mallampati: II  TM Distance: >3 FB Neck ROM: Full    Dental  (+) Edentulous Upper, Edentulous Lower   Pulmonary neg shortness of breath, neg sleep apnea, neg COPDneg recent URI, former smoker,  breath sounds clear to auscultation        Cardiovascular hypertension, Pt. on medications + Valvular Problems/Murmurs AI Rhythm:Regular + Diastolic murmurs    Neuro/Psych negative neurological ROS  negative psych ROS   GI/Hepatic negative GI ROS, Neg liver ROS,   Endo/Other  negative endocrine ROS  Renal/GU negative Renal ROS     Musculoskeletal  (+) Arthritis -,   Abdominal   Peds  Hematology negative hematology ROS (+)   Anesthesia Other Findings   Reproductive/Obstetrics                             Anesthesia Physical Anesthesia Plan  ASA: IV  Anesthesia Plan: General   Post-op Pain Management:    Induction: Intravenous  Airway Management Planned: Oral ETT  Additional Equipment: Arterial line, CVP, PA Cath, TEE and Ultrasound Guidance Line Placement  Intra-op Plan:   Post-operative Plan: Post-operative intubation/ventilation  Informed Consent: I have reviewed the patients History and Physical, chart, labs and discussed the procedure including the risks, benefits and alternatives for the proposed anesthesia with the patient or authorized representative who has indicated his/her understanding and acceptance.     Plan Discussed with: CRNA and Surgeon  Anesthesia Plan Comments:         Anesthesia Quick Evaluation

## 2014-04-11 NOTE — Op Note (Signed)
CARDIOVASCULAR SURGERY OPERATIVE NOTE  04/11/2014 NERO SAWATZKY [id number]  Surgeon:  Gaye Pollack, MD  First Assistant: Suzzanne Cloud,  PA-C   Preoperative Diagnosis:  Severe aortic insufficiency   Postoperative Diagnosis:  Same   Procedure:  1. Median Sternotomy 2. Extracorporeal circulation 3.   Aortic valve replacement using a 23 mm  Edwards Magna-Ease pericardial valve.  Anesthesia:  General Endotracheal   Clinical History/Surgical Indication:  The patient is a 74 year old gentleman with HTN and hypercholesterolemia who drives a school bus and says that he was having a physical to maintain his license and was noted to have a murmur. He was seen by Dr. Daneen Schick in 12/2012. He had moderate to severe AI by echo with no heart failure symptoms. The aortic root was enlarged by echo but a CTA on 01/05/2013 showed a normal caliber aorta with a diameter of 3.6 cm at its greatest diameter in the mid ascending aorta. He was followed with echo and his most recent study on 03/24/2014 showed that the LV was mildly dilated and there was moderate LV dysfunction with a drop in the EF to 35-40% compared to the echo in 11/25/2013 when the EF was 40-45%. The aortic valve leaflets are mildly thickened and not coapting with severe AI. There is mild to moderate central MR due to leaflet tethering from LV dilatation. Cardiac cath on 02/22/2014 showed severe AI with mild aortic root enlargement. There was moderate MR that is probably due to leaflet tethering and the catheter. There was no coronary disease. The ascending aorta appeared the same on TEE at 37 mm. I don't think this needs to be replaced in this 74 year old with a trileaflet aortic valve. He says that he feels fine and denies any symptoms although he says he doesn't overdo it. He has mild to moderate MR. I reviewed his TEE and I think it is mild central MR due to leaflet tethering from LV dilatation. The valve looks structurally normal. This  will usually get better with AVR as the ventricle is less stressed and decreases in size. I would plan to do AVR using a pericardial tissue valve. I discussed the operative procedure with the patient and his son including alternatives, benefits and risks; including but not limited to bleeding, blood transfusion, infection, stroke, myocardial infarction, graft failure, heart block requiring a permanent pacemaker, organ dysfunction, and death. Pierce Crane Ellsworth understands and agrees to proceed.   Preparation:  The patient was seen in the preoperative holding area and the correct patient, correct operation were confirmed with the patient after reviewing the medical record and catheterization. The consent was signed by me. Preoperative antibiotics were given. A pulmonary arterial line and radial arterial line were placed by the anesthesia team. The patient was taken back to the operating room and positioned supine on the operating room table. After being placed under general endotracheal anesthesia by the anesthesia team a foley catheter was placed. The neck, chest, abdomen, and both legs were prepped with betadine soap and solution and draped in the usual sterile manner. A surgical time-out was taken and the correct patient and operative procedure were confirmed with the nursing and anesthesia staff.   Pre-bypass TEE:   Complete TEE assessment was performed by Dr. Laurie Panda. This showed severe central AI and there appeared to be leaflet prolapse with no significant zone of coaptation. Mild MR. LVEF was reduced to 30%.    Post-bypass TEE:   Normal functioning prosthetic aortic valve  with no perivalvular leak or regurgitation through the valve. Left ventricular function unchanged. Mild mitral regurgitation.    Cardiopulmonary Bypass:  A median sternotomy was performed. The pericardium was opened in the midline. Right ventricular function appeared normal. The ascending aorta was of normal size  and had no palpable plaque. There were no contraindications to aortic cannulation or cross-clamping. The patient was fully systemically heparinized and the ACT was maintained > 400 sec. The proximal aortic arch was cannulated with a 20 F aortic cannula for arterial inflow. Venous cannulation was performed via the right atrial appendage using a two-staged venous cannula. An antegrade cardioplegia/vent cannula was inserted into the mid-ascending aorta. A left ventricular vent was placed via the right superior pulmonary vein. A retrograde cardioplegia cannnula was placed into the coronary sinus via the right atrium. Aortic occlusion was performed with a single cross-clamp. Systemic cooling to 32 degrees Centigrade and topical cooling of the heart with iced saline were used. Hyperkalemic antegrade cold blood cardioplegia was used to induce diastolic arrest and then cold blood retrograde cardioplegia was given at about 20 minute intervals throughout the period of arrest to maintain myocardial temperature at or below 10 degrees centigrade. A temperature probe was inserted into the interventricular septum and an insulating pad was placed in the pericardium. Carbon dioxide was insufflated into the pericardium at 5L/min throughout the procedure to minimize intracardiac air.   Aortic Valve Replacement:   A transverse aortotomy was performed 1 cm above the take-off of the right coronary artery. The native valve was tricuspid with mildly thickened leaflets that were prolapsing and appeared elongated. There was no annular calcification. The ostia of the coronary arteries were in normal position and were not obstructed. The native valve leaflets were excised . Care was taken to remove all particulate debris. The left ventricle was directly inspected for debris and then irrigated with ice saline solution. The annulus was sized and a size 23 mm Edwards Magna-Ease pericardial valve was chosen. The model number was [id number] and  the serial number was [id number]. While the valve was being prepared 2-0 Ethibond pledgeted horizontal mattress sutures were placed around the annulus with the pledgets in a sub-annular position. The sutures were placed through the sewing ring and the valve lowered into place. The sutures were tied sequentially. The valve seated nicely and the coronary ostia were not obstructed. The prosthetic valve leaflets moved normally and there was no sub-valvular obstruction. The aortotomy was closed using 4-0 Prolene suture in 2 layers with felt strips to reinforce the closure.  Completion:  The patient was rewarmed to 37 degrees Centigrade. De-airing maneuvers were performed and the head placed in trendelenburg position. The crossclamp was removed with a time of 61 minutes. There was spontaneous return of sinus rhythm. The aortotomy was checked for hemostasis. Two temporary epicardial pacing wires were placed on the right atrium and two on the right ventricle. The left ventricular vent and retrograde cardioplegia cannulas were removed. The patient was weaned from CPB without difficulty on no inotropes. CPB time was 79 minutes. Cardiac output was 5 LPM. Heparin was fully reversed with protamine and the aortic and venous cannulas removed. Hemostasis was achieved. Mediastinal drainage tubes were placed. The sternum was closed with double #6 stainless steel wires. The fascia was closed with continuous # 1 vicryl suture. The subcutaneous tissue was closed with 2-0 vicryl continuous suture. The skin was closed with 3-0 vicryl subcuticular suture. All sponge, needle, and instrument counts were reported correct at the end  of the case. Dry sterile dressings were placed over the incisions and around the chest tubes which were connected to pleurevac suction. The patient was then transported to the surgical intensive care unit in critical but stable condition.

## 2014-04-11 NOTE — Anesthesia Procedure Notes (Addendum)
Procedure Name: Intubation Date/Time: 04/11/2014 8:13 AM Performed by: Barrington Ellison Pre-anesthesia Checklist: Patient identified, Emergency Drugs available, Suction available, Patient being monitored and Timeout performed Patient Re-evaluated:Patient Re-evaluated prior to inductionOxygen Delivery Method: Circle system utilized Intubation Type: IV induction Ventilation: Mask ventilation without difficulty and Oral airway inserted - appropriate to patient size Laryngoscope Size: Mac and 3 Grade View: Grade I Tube type: Oral Tube size: 8.0 mm Number of attempts: 1 Airway Equipment and Method: Stylet Placement Confirmation: ETT inserted through vocal cords under direct vision,  positive ETCO2 and breath sounds checked- equal and bilateral Secured at: 24 cm Tube secured with: Tape Dental Injury: Teeth and Oropharynx as per pre-operative assessment

## 2014-04-11 NOTE — Anesthesia Postprocedure Evaluation (Signed)
  Anesthesia Post-op Note  Patient: Frederick Burnett  Procedure(s) Performed: Procedure(s): AORTIC VALVE REPLACEMENT (AVR) (N/A) TRANSESOPHAGEAL ECHOCARDIOGRAM (TEE) (N/A)  Patient Location: ICU  Anesthesia Type:General  Level of Consciousness: Patient remains intubated per anesthesia plan  Airway and Oxygen Therapy: Patient remains intubated per anesthesia plan and Patient placed on Ventilator (see vital sign flow sheet for setting)  Post-op Pain: none  Post-op Assessment: Post-op Vital signs reviewed, Patient's Cardiovascular Status Stable and Respiratory Function Stable  Post-op Vital Signs: Reviewed and stable  Last Vitals:  Filed Vitals:   04/11/14 0722  BP: 163/65  Pulse: 77  Temp: 36.7 C  Resp: 18    Complications: No apparent anesthesia complications

## 2014-04-11 NOTE — Brief Op Note (Signed)
04/11/2014  10:52 AM  PATIENT:  Frederick Burnett  74 y.o. male  PRE-OPERATIVE DIAGNOSIS:  Severe Aortic Insufficiency  POST-OPERATIVE DIAGNOSIS:  Severe Aortic Insufficiency  PROCEDURE:   AORTIC VALVE REPLACEMENT (23 mm [place] Ease pericardial tissue valve)  SURGEON:  Gaye Pollack, MD  ASSISTANT: Suzzanne Cloud, PA-C  ANESTHESIA:   general  PATIENT CONDITION:  ICU - intubated and hemodynamically stable.  PRE-OPERATIVE WEIGHT: 87 kg  Aortic Valve Etiology   Aortic Insufficiency:  Severe  Aortic Valve Disease:  Yes.  Aortic Stenosis:  No.  Etiology (Choose at least one and up to  5 etiologies):  Degenerative - Pure annular dilation without leaflet prolapse  Aortic Valve  Procedure Performed:  Replacement: Yes.  Bioprosthetic Valve. Implant Model Number:[id number], Size:23, Unique Device Identifier:[id number]  Repair/Reconstruction: No.   Aortic Annular Enlargement: No.

## 2014-04-11 NOTE — Progress Notes (Signed)
  Echocardiogram Echocardiogram Transesophageal has been performed.  Frederick Burnett M 04/11/2014, 8:57 AM

## 2014-04-12 ENCOUNTER — Encounter (HOSPITAL_COMMUNITY): Payer: Self-pay | Admitting: Surgery

## 2014-04-12 ENCOUNTER — Inpatient Hospital Stay (HOSPITAL_COMMUNITY): Payer: Medicare Other

## 2014-04-12 LAB — CREATININE, SERUM
CREATININE: 1.03 mg/dL (ref 0.50–1.35)
GFR calc non Af Amer: 70 mL/min — ABNORMAL LOW (ref >90)
GFR, EST AFRICAN AMERICAN: 81 mL/min — AB (ref >90)

## 2014-04-12 LAB — GLUCOSE, CAPILLARY
GLUCOSE-CAPILLARY: 102 mg/dL — AB (ref 70–99)
GLUCOSE-CAPILLARY: 106 mg/dL — AB (ref 70–99)
GLUCOSE-CAPILLARY: 110 mg/dL — AB (ref 70–99)
GLUCOSE-CAPILLARY: 115 mg/dL — AB (ref 70–99)
GLUCOSE-CAPILLARY: 123 mg/dL — AB (ref 70–99)
GLUCOSE-CAPILLARY: 144 mg/dL — AB (ref 70–99)
GLUCOSE-CAPILLARY: 157 mg/dL — AB (ref 70–99)
Glucose-Capillary: 103 mg/dL — ABNORMAL HIGH (ref 70–99)
Glucose-Capillary: 110 mg/dL — ABNORMAL HIGH (ref 70–99)
Glucose-Capillary: 111 mg/dL — ABNORMAL HIGH (ref 70–99)
Glucose-Capillary: 114 mg/dL — ABNORMAL HIGH (ref 70–99)
Glucose-Capillary: 120 mg/dL — ABNORMAL HIGH (ref 70–99)
Glucose-Capillary: 125 mg/dL — ABNORMAL HIGH (ref 70–99)
Glucose-Capillary: 95 mg/dL (ref 70–99)
Glucose-Capillary: 95 mg/dL (ref 70–99)

## 2014-04-12 LAB — CBC
HCT: 31.6 % — ABNORMAL LOW (ref 39.0–52.0)
HEMATOCRIT: 31 % — AB (ref 39.0–52.0)
Hemoglobin: 10.4 g/dL — ABNORMAL LOW (ref 13.0–17.0)
Hemoglobin: 10.7 g/dL — ABNORMAL LOW (ref 13.0–17.0)
MCH: 28 pg (ref 26.0–34.0)
MCH: 28.5 pg (ref 26.0–34.0)
MCHC: 33.5 g/dL (ref 30.0–36.0)
MCHC: 33.9 g/dL (ref 30.0–36.0)
MCV: 83.6 fL (ref 78.0–100.0)
MCV: 84 fL (ref 78.0–100.0)
PLATELETS: 115 10*3/uL — AB (ref 150–400)
Platelets: 132 10*3/uL — ABNORMAL LOW (ref 150–400)
RBC: 3.71 MIL/uL — ABNORMAL LOW (ref 4.22–5.81)
RBC: 3.76 MIL/uL — ABNORMAL LOW (ref 4.22–5.81)
RDW: 13.1 % (ref 11.5–15.5)
RDW: 13.4 % (ref 11.5–15.5)
WBC: 8.5 10*3/uL (ref 4.0–10.5)
WBC: 9.3 10*3/uL (ref 4.0–10.5)

## 2014-04-12 LAB — BASIC METABOLIC PANEL
ANION GAP: 7 (ref 5–15)
BUN: 8 mg/dL (ref 6–23)
CALCIUM: 8.2 mg/dL — AB (ref 8.4–10.5)
CO2: 24 mmol/L (ref 19–32)
Chloride: 112 mmol/L (ref 96–112)
Creatinine, Ser: 1.1 mg/dL (ref 0.50–1.35)
GFR calc non Af Amer: 65 mL/min — ABNORMAL LOW (ref >90)
GFR, EST AFRICAN AMERICAN: 75 mL/min — AB (ref >90)
Glucose, Bld: 122 mg/dL — ABNORMAL HIGH (ref 70–99)
Potassium: 3.4 mmol/L — ABNORMAL LOW (ref 3.5–5.1)
SODIUM: 143 mmol/L (ref 135–145)

## 2014-04-12 LAB — POCT I-STAT, CHEM 8
BUN: 10 mg/dL (ref 6–23)
Calcium, Ion: 1.23 mmol/L (ref 1.13–1.30)
Chloride: 104 mmol/L (ref 96–112)
Creatinine, Ser: 0.9 mg/dL (ref 0.50–1.35)
GLUCOSE: 112 mg/dL — AB (ref 70–99)
HCT: 32 % — ABNORMAL LOW (ref 39.0–52.0)
Hemoglobin: 10.9 g/dL — ABNORMAL LOW (ref 13.0–17.0)
Potassium: 4 mmol/L (ref 3.5–5.1)
Sodium: 140 mmol/L (ref 135–145)
TCO2: 20 mmol/L (ref 0–100)

## 2014-04-12 LAB — TYPE AND SCREEN
ABO/RH(D): AB POS
ANTIBODY SCREEN: NEGATIVE

## 2014-04-12 LAB — MAGNESIUM
MAGNESIUM: 2.4 mg/dL (ref 1.5–2.5)
Magnesium: 2.2 mg/dL (ref 1.5–2.5)

## 2014-04-12 LAB — PREPARE PLATELET PHERESIS: Unit division: 0

## 2014-04-12 MED ORDER — POTASSIUM CHLORIDE 10 MEQ/50ML IV SOLN
10.0000 meq | INTRAVENOUS | Status: AC
  Administered 2014-04-12 (×3): 10 meq via INTRAVENOUS
  Filled 2014-04-12 (×2): qty 50

## 2014-04-12 MED ORDER — POTASSIUM CHLORIDE 10 MEQ/50ML IV SOLN
INTRAVENOUS | Status: AC
Start: 2014-04-12 — End: 2014-04-12
  Filled 2014-04-12: qty 50

## 2014-04-12 MED ORDER — INSULIN DETEMIR 100 UNIT/ML ~~LOC~~ SOLN
8.0000 [IU] | Freq: Once | SUBCUTANEOUS | Status: AC
  Administered 2014-04-12: 8 [IU] via SUBCUTANEOUS
  Filled 2014-04-12: qty 0.08

## 2014-04-12 MED ORDER — INSULIN ASPART 100 UNIT/ML ~~LOC~~ SOLN: 0.0000 [IU] | SUBCUTANEOUS | Status: DC

## 2014-04-12 MED ORDER — FUROSEMIDE 10 MG/ML IJ SOLN
20.0000 mg | Freq: Once | INTRAMUSCULAR | Status: AC
  Administered 2014-04-12: 20 mg via INTRAVENOUS

## 2014-04-12 MED ORDER — INSULIN DETEMIR 100 UNIT/ML ~~LOC~~ SOLN
8.0000 [IU] | Freq: Every day | SUBCUTANEOUS | Status: DC
  Administered 2014-04-13: 8 [IU] via SUBCUTANEOUS
  Filled 2014-04-12 (×2): qty 0.08

## 2014-04-12 MED ORDER — METOPROLOL TARTRATE 12.5 MG HALF TABLET
12.5000 mg | ORAL_TABLET | Freq: Two times a day (BID) | ORAL | Status: DC
  Administered 2014-04-13: 12.5 mg via ORAL
  Filled 2014-04-12 (×2): qty 1

## 2014-04-12 MED ORDER — METOPROLOL TARTRATE 25 MG/10 ML ORAL SUSPENSION
12.5000 mg | Freq: Two times a day (BID) | ORAL | Status: DC
  Filled 2014-04-12 (×2): qty 5

## 2014-04-12 MED ORDER — POTASSIUM CHLORIDE 10 MEQ/50ML IV SOLN
10.0000 meq | Freq: Once | INTRAVENOUS | Status: AC
  Administered 2014-04-12: 10 meq via INTRAVENOUS

## 2014-04-12 MED ORDER — PANTOPRAZOLE SODIUM 40 MG PO TBEC
40.0000 mg | DELAYED_RELEASE_TABLET | Freq: Every day | ORAL | Status: DC
  Administered 2014-04-12 – 2014-04-13 (×2): 40 mg via ORAL
  Filled 2014-04-12 (×2): qty 1

## 2014-04-12 MED ORDER — POTASSIUM CHLORIDE 10 MEQ/50ML IV SOLN: 10.0000 meq | INTRAVENOUS | Status: DC | PRN

## 2014-04-12 MED ORDER — CETYLPYRIDINIUM CHLORIDE 0.05 % MT LIQD
7.0000 mL | Freq: Two times a day (BID) | OROMUCOSAL | Status: DC
  Administered 2014-04-12 – 2014-04-14 (×4): 7 mL via OROMUCOSAL

## 2014-04-12 MED ORDER — SODIUM CHLORIDE 0.9 % IV SOLN
INTRAVENOUS | Status: DC
  Administered 2014-04-12: 10 mL/h via INTRAVENOUS

## 2014-04-12 NOTE — Progress Notes (Signed)
TCTS BRIEF SICU PROGRESS NOTE  1 Day Post-Op  S/P Procedure(s) (LRB): AORTIC VALVE REPLACEMENT (AVR) (N/A) TRANSESOPHAGEAL ECHOCARDIOGRAM (TEE) (N/A)   Stable day NSR w/ stable BP off Neo drip O2 sats 94% on 2 L/min UOP adequate Labs okay w/ Hgb 10.7  Plan: Continue routine care  Frederick Burnett H 04/12/2014 8:26 PM

## 2014-04-12 NOTE — Progress Notes (Addendum)
TCTS DAILY ICU PROGRESS NOTE                   [address]            [place] [address]          [phone]   1 Day Post-Op Procedure(s) (LRB): AORTIC VALVE REPLACEMENT (AVR) (N/A) TRANSESOPHAGEAL ECHOCARDIOGRAM (TEE) (N/A)  Total Length of Stay:  LOS: 1 day   Subjective: Patient sitting in chair. Has a little nausea this am.  Objective: Vital signs in last 24 hours: Temp:  [97.6 F (36.4 C)-98.9 F (37.2 C)] 98.9 F (37.2 C) (02/16 0347) Pulse Rate:  [75-88] 84 (02/16 0600) Cardiac Rhythm:  [-] Normal sinus rhythm (02/16 0400) Resp:  [12-30] 30 (02/16 0600) BP: (76-115)/(52-76) 104/67 mmHg (02/16 0600) SpO2:  [95 %-100 %] 98 % (02/16 0600) Arterial Line BP: (68-114)/(49-73) 114/62 mmHg (02/16 0600) FiO2 (%):  [40 %-50 %] 40 % (02/15 1449) Weight:  [179 lb 3.7 oz (81.3 kg)] 179 lb 3.7 oz (81.3 kg) (02/16 0500)  Filed Weights   04/11/14 0722 04/12/14 0500  Weight: 192 lb (87.091 kg) 179 lb 3.7 oz (81.3 kg)    Hemodynamic parameters for last 24 hours: PAP: (35-53)/(16-28) 49/21 mmHg CO:  [2.3 L/min-3.5 L/min] 3.5 L/min CI:  [1.1 L/min/m2-1.7 L/min/m2] 1.7 L/min/m2  Intake/Output from previous day: 02/15 0701 - 02/16 0700 In: 4722 [I.V.:2788; Blood:634; IV Piggyback:1300] Out: 5784 [Urine:2185; Blood:600; Chest Tube:590]     Current Meds: Scheduled Meds: . acetaminophen  1,000 mg Oral 4 times per day   Or  . acetaminophen (TYLENOL) oral liquid 160 mg/5 mL  1,000 mg Per Tube 4 times per day  . antiseptic oral rinse  7 mL Mouth Rinse QID  . aspirin EC  325 mg Oral Daily   Or  . aspirin  324 mg Per Tube Daily  . bisacodyl  10 mg Oral Daily   Or  . bisacodyl  10 mg Rectal Daily  . cefUROXime (ZINACEF)  IV  1.5 g Intravenous Q12H  . chlorhexidine  15 mL Mouth Rinse BID  . docusate sodium  200 mg Oral Daily  . famotidine (PEPCID) IV  20 mg Intravenous Q12H  . insulin regular  0-10 Units Intravenous TID WC  . metoprolol tartrate  12.5 mg  Oral BID   Or  . metoprolol tartrate  12.5 mg Per Tube BID  . [START ON 04/13/2014] pantoprazole  40 mg Oral Daily  . potassium chloride  10 mEq Intravenous Q1 Hr x 3  . sodium chloride  3 mL Intravenous Q12H   Continuous Infusions: . sodium chloride 10 mL/hr at 04/11/14 1500  . sodium chloride    . sodium chloride 10 mL (04/11/14 1300)  . dexmedetomidine 0 mcg/kg/hr (04/11/14 1400)  . insulin (NOVOLIN-R) infusion 1.7 Units/hr (04/12/14 0650)  . lactated ringers 10 mL/hr (04/11/14 1220)  . nitroGLYCERIN    . phenylephrine (NEO-SYNEPHRINE) Adult infusion 30 mcg/min (04/12/14 0230)   PRN Meds:.metoprolol, midazolam, morphine injection, ondansetron (ZOFRAN) IV, oxyCODONE, sodium chloride, traMADol  General appearance: alert, cooperative and no distress Neurologic: intact Heart: RRR, no murmur, rub with CTs in place Lungs: Diminished at bases Abdomen: soft, non-tender; bowel sounds normal; no masses,  no organomegaly Extremities: Trace LE edema Wound: Dressing intact  Lab Results: CBC: Recent Labs  04/11/14 1745 04/11/14 1752 04/12/14 0350  WBC 7.5  --  8.5  HGB 11.1* 11.2* 10.4*  HCT 32.8* 33.0* 31.0*  PLT 126*  --  132*   BMET:  Recent Labs  04/11/14 1752 04/12/14 0350  NA 144 143  K 3.9 3.4*  CL 107 112  CO2  --  24  GLUCOSE 150* 122*  BUN 9 8  CREATININE 1.00 1.10  CALCIUM  --  8.2*    PT/INR:  Recent Labs  04/11/14 1200  LABPROT 17.3*  INR 1.40   Radiology: Dg Chest Port 1 View  04/11/2014   CLINICAL DATA:  Postoperative from aortic valve repair.  EXAM: PORTABLE CHEST - 1 VIEW  COMPARISON:  PA and lateral chest x-ray of April 07, 2014  FINDINGS: The lungs are less well inflated today. There are chest tubes bilaterally. On the right a chest tube is present with the tip projecting over the posterior aspect of the fourth rib. On the left there are 3 chest tubes though the most medial 1 May reflect a mediastinal drain. The uppermost chest tube tip projects  over the posterior aspect of the fifth rib. The lower most chest tube projects over in the region of the posterior costophrenic gutter. There is no pneumothorax nor significant pleural effusion. The cardiac silhouette is mildly enlarged. The pulmonary vascularity is indistinct centrally with mild cephalization noted.  The endotracheal tube tip projects 3 cm above the carina. The esophagogastric tube tip projects below the inferior margin of the image. The Swan-Ganz catheter tip projects in the region of the proximal right main pulmonary artery.  IMPRESSION: Postsurgical changes as described. Low-grade pulmonary edema is present. There is no pneumothorax or pleural effusion. The support tubes and lines are in appropriate position radiographically.   Electronically Signed   By: David  Martinique   On: 04/11/2014 12:47     Assessment/Plan: S/P Procedure(s) (LRB): AORTIC VALVE REPLACEMENT (AVR) (N/A) TRANSESOPHAGEAL ECHOCARDIOGRAM (TEE) (N/A)  1.CV-Swan removed last evening.SR in the 80's this am. On Neo synephrine drip. Wean as tolerates. Hold Lopressor until off Neo.  2.Pulmonary-On 2 liters of oxygen via [place]. Chest tubes with 590 cc since surgery. Chest tubes will be removed later this am. CXR shows trace right apical pneumothorax, bibasilar atelectasis L>R. Encourage incentive spirometer 3.Mild volume Overload-Will diurese once BP improves 4.ABL anemia-H and H 10.4 and 31 5.Mild thrombocytopenia-platelets 132,000 6. Supplement potassium-will give 5 runs 7. CBGs 114/111/103. On Insulin drip. Pre op HGA1C 5.7. He is pre diabetic. Once transferred, will stop accu checks and SS. 8. Zofran PRN nausea 9. Please see progression orders   Arnoldo Lenis 04/12/2014 7:29 AM   Chart reviewed, patient examined, agree with above. He is going well postop. Plan as outlined above.

## 2014-04-13 ENCOUNTER — Inpatient Hospital Stay (HOSPITAL_COMMUNITY): Payer: Medicare Other

## 2014-04-13 LAB — BASIC METABOLIC PANEL
Anion gap: 7 (ref 5–15)
BUN: 9 mg/dL (ref 6–23)
CO2: 26 mmol/L (ref 19–32)
Calcium: 8.4 mg/dL (ref 8.4–10.5)
Chloride: 105 mmol/L (ref 96–112)
Creatinine, Ser: 1 mg/dL (ref 0.50–1.35)
GFR calc Af Amer: 84 mL/min — ABNORMAL LOW (ref >90)
GFR, EST NON AFRICAN AMERICAN: 73 mL/min — AB (ref >90)
GLUCOSE: 104 mg/dL — AB (ref 70–99)
Potassium: 3.9 mmol/L (ref 3.5–5.1)
Sodium: 138 mmol/L (ref 135–145)

## 2014-04-13 LAB — GLUCOSE, CAPILLARY
GLUCOSE-CAPILLARY: 106 mg/dL — AB (ref 70–99)
GLUCOSE-CAPILLARY: 122 mg/dL — AB (ref 70–99)
GLUCOSE-CAPILLARY: 104 mg/dL — AB (ref 70–99)
GLUCOSE-CAPILLARY: 104 mg/dL — AB (ref 70–99)
Glucose-Capillary: 115 mg/dL — ABNORMAL HIGH (ref 70–99)
Glucose-Capillary: 93 mg/dL (ref 70–99)
Glucose-Capillary: 110 mg/dL — ABNORMAL HIGH (ref 70–99)
Glucose-Capillary: 97 mg/dL (ref 70–99)
Glucose-Capillary: 113 mg/dL — ABNORMAL HIGH (ref 70–99)
Glucose-Capillary: 120 mg/dL — ABNORMAL HIGH (ref 70–99)

## 2014-04-13 LAB — CBC
HCT: 31.6 % — ABNORMAL LOW (ref 39.0–52.0)
Hemoglobin: 10.3 g/dL — ABNORMAL LOW (ref 13.0–17.0)
MCH: 27.8 pg (ref 26.0–34.0)
MCHC: 32.6 g/dL (ref 30.0–36.0)
MCV: 85.2 fL (ref 78.0–100.0)
PLATELETS: 107 10*3/uL — AB (ref 150–400)
RBC: 3.71 MIL/uL — AB (ref 4.22–5.81)
RDW: 13.3 % (ref 11.5–15.5)
WBC: 8.4 10*3/uL (ref 4.0–10.5)

## 2014-04-13 MED ORDER — ASPIRIN EC 325 MG PO TBEC
325.0000 mg | DELAYED_RELEASE_TABLET | Freq: Every day | ORAL | Status: DC
  Administered 2014-04-14 – 2014-04-15 (×2): 325 mg via ORAL
  Filled 2014-04-13 (×2): qty 1

## 2014-04-13 MED ORDER — ONDANSETRON HCL 4 MG/2ML IJ SOLN: 4.0000 mg | Freq: Four times a day (QID) | INTRAMUSCULAR | Status: DC | PRN

## 2014-04-13 MED ORDER — PANTOPRAZOLE SODIUM 40 MG PO TBEC
40.0000 mg | DELAYED_RELEASE_TABLET | Freq: Every day | ORAL | Status: DC
  Administered 2014-04-14 – 2014-04-15 (×2): 40 mg via ORAL
  Filled 2014-04-13 (×2): qty 1

## 2014-04-13 MED ORDER — ONDANSETRON HCL 4 MG PO TABS: 4.0000 mg | ORAL_TABLET | Freq: Four times a day (QID) | ORAL | Status: DC | PRN

## 2014-04-13 MED ORDER — BISACODYL 10 MG RE SUPP: 10.0000 mg | Freq: Every day | RECTAL | Status: DC | PRN

## 2014-04-13 MED ORDER — MOVING RIGHT ALONG BOOK
Freq: Once | Status: AC
  Administered 2014-04-13: 16:00:00
  Filled 2014-04-13: qty 1

## 2014-04-13 MED ORDER — TRAMADOL HCL 50 MG PO TABS: 50.0000 mg | ORAL_TABLET | ORAL | Status: DC | PRN

## 2014-04-13 MED ORDER — SODIUM CHLORIDE 0.9 % IJ SOLN: 3.0000 mL | INTRAMUSCULAR | Status: DC | PRN

## 2014-04-13 MED ORDER — SODIUM CHLORIDE 0.9 % IV SOLN
250.0000 mL | INTRAVENOUS | Status: DC | PRN
Start: 2014-04-13 — End: 2014-04-15

## 2014-04-13 MED ORDER — SODIUM BICARBONATE 8.4 % IV SOLN
50.0000 meq | Freq: Once | INTRAVENOUS | Status: DC
  Filled 2014-04-13: qty 50

## 2014-04-13 MED ORDER — BISACODYL 5 MG PO TBEC
10.0000 mg | DELAYED_RELEASE_TABLET | Freq: Every day | ORAL | Status: DC | PRN
  Administered 2014-04-15: 10 mg via ORAL
  Filled 2014-04-13: qty 2

## 2014-04-13 MED ORDER — SODIUM CHLORIDE 0.9 % IJ SOLN
3.0000 mL | Freq: Two times a day (BID) | INTRAMUSCULAR | Status: DC
  Administered 2014-04-14 – 2014-04-15 (×3): 3 mL via INTRAVENOUS

## 2014-04-13 MED ORDER — METOPROLOL TARTRATE 25 MG PO TABS
25.0000 mg | ORAL_TABLET | Freq: Two times a day (BID) | ORAL | Status: DC
  Administered 2014-04-13 – 2014-04-15 (×4): 25 mg via ORAL
  Filled 2014-04-13 (×5): qty 1

## 2014-04-13 MED ORDER — FUROSEMIDE 10 MG/ML IJ SOLN
40.0000 mg | Freq: Once | INTRAMUSCULAR | Status: AC
  Administered 2014-04-13: 40 mg via INTRAVENOUS
  Filled 2014-04-13: qty 4

## 2014-04-13 MED ORDER — ACETAMINOPHEN 325 MG PO TABS: 650.0000 mg | ORAL_TABLET | Freq: Four times a day (QID) | ORAL | Status: DC | PRN

## 2014-04-13 MED ORDER — OXYCODONE HCL 5 MG PO TABS: 5.0000 mg | ORAL_TABLET | ORAL | Status: DC | PRN

## 2014-04-13 MED ORDER — DOCUSATE SODIUM 100 MG PO CAPS
200.0000 mg | ORAL_CAPSULE | Freq: Every day | ORAL | Status: DC
  Administered 2014-04-14 – 2014-04-15 (×2): 200 mg via ORAL
  Filled 2014-04-13 (×2): qty 2

## 2014-04-13 MED ORDER — POTASSIUM CHLORIDE CRYS ER 20 MEQ PO TBCR
40.0000 meq | EXTENDED_RELEASE_TABLET | Freq: Once | ORAL | Status: AC
  Administered 2014-04-13: 40 meq via ORAL
  Filled 2014-04-13: qty 2

## 2014-04-13 MED FILL — Heparin Sodium (Porcine) Inj 1000 Unit/ML: INTRAMUSCULAR | Qty: 30 | Status: AC

## 2014-04-13 MED FILL — Mannitol IV Soln 20%: INTRAVENOUS | Qty: 500 | Status: AC

## 2014-04-13 MED FILL — Potassium Chloride Inj 2 mEq/ML: INTRAVENOUS | Qty: 40 | Status: AC

## 2014-04-13 MED FILL — Lidocaine HCl IV Inj 20 MG/ML: INTRAVENOUS | Qty: 5 | Status: AC

## 2014-04-13 MED FILL — Magnesium Sulfate Inj 50%: INTRAMUSCULAR | Qty: 10 | Status: AC

## 2014-04-13 MED FILL — Sodium Bicarbonate IV Soln 8.4%: INTRAVENOUS | Qty: 50 | Status: AC

## 2014-04-13 MED FILL — Heparin Sodium (Porcine) Inj 1000 Unit/ML: INTRAMUSCULAR | Qty: 10 | Status: AC

## 2014-04-13 MED FILL — Sodium Chloride IV Soln 0.9%: INTRAVENOUS | Qty: 2000 | Status: AC

## 2014-04-13 MED FILL — Electrolyte-R (PH 7.4) Solution: INTRAVENOUS | Qty: 3000 | Status: AC

## 2014-04-13 NOTE — Progress Notes (Signed)
CT surgery p.m. Rounds  Patient examined and record reviewed.Hemodynamics stable,labs satisfactory.Patient had stable day.Continue current care. Frederick Burnett,Frederick Burnett 04/13/2014

## 2014-04-13 NOTE — Progress Notes (Signed)
2 Days Post-Op Procedure(s) (LRB): AORTIC VALVE REPLACEMENT (AVR) (N/A) TRANSESOPHAGEAL ECHOCARDIOGRAM (TEE) (N/A) Subjective:  No complaints  Objective: Vital signs in last 24 hours: Temp:  [98.1 F (36.7 C)-99.1 F (37.3 C)] 98.8 F (37.1 C) (02/17 0747) Pulse Rate:  [72-106] 80 (02/17 0700) Cardiac Rhythm:  [-] Normal sinus rhythm (02/17 0000) Resp:  [0-37] 26 (02/17 0600) BP: (83-150)/(56-81) 131/76 mmHg (02/17 0700) SpO2:  [96 %-100 %] 99 % (02/17 0700) Arterial Line BP: (77-120)/(53-70) 95/56 mmHg (02/16 1200) Weight:  [84.9 kg (187 lb 2.7 oz)] 84.9 kg (187 lb 2.7 oz) (02/17 6269)  Hemodynamic parameters for last 24 hours:    Intake/Output from previous day: 02/16 0701 - 02/17 0700 In: 1099.9 [P.O.:520; I.V.:379.9; IV Piggyback:200] Out: 1600 [Urine:1480; Chest Tube:120] Intake/Output this shift:    General appearance: alert and cooperative Neurologic: intact Heart: regular rate and rhythm, S1, S2 normal, no murmur, click, rub or gallop Lungs: clear to auscultation bilaterally Extremities: extremities normal, atraumatic, no cyanosis or edema Wound: dressing dry  Lab Results:  Recent Labs  04/12/14 1658 04/13/14 0500  WBC 9.3 8.4  HGB 10.7*  10.9* 10.3*  HCT 31.6*  32.0* 31.6*  PLT 115* 107*   BMET:  Recent Labs  04/12/14 0350 04/12/14 1658 04/13/14 0500  NA 143 140 138  K 3.4* 4.0 3.9  CL 112 104 105  CO2 24  --  26  GLUCOSE 122* 112* 104*  BUN 8 10 9   CREATININE 1.10 1.03  0.90 1.00  CALCIUM 8.2*  --  8.4    PT/INR:  Recent Labs  04/11/14 1200  LABPROT 17.3*  INR 1.40   ABG    Component Value Date/Time   PHART 7.323* 04/11/2014 1648   HCO3 25.2* 04/11/2014 1648   TCO2 20 04/12/2014 1658   ACIDBASEDEF 1.0 04/11/2014 1648   O2SAT 97.0 04/11/2014 1648   CBG (last 3)   Recent Labs  04/12/14 1915 04/12/14 2328 04/13/14 0348  GLUCAP 95 106* 110*    Assessment/Plan: S/P Procedure(s) (LRB): AORTIC VALVE REPLACEMENT (AVR)  (N/A) TRANSESOPHAGEAL ECHOCARDIOGRAM (TEE) (N/A)  He is hemodynamically stable in sinus rhythm. Continue lopressor. Glucose under control and preop  HgbA1c only 5.7. Will stop insulin and CBG's Mobilize Diuresis Plan for transfer to step-down: see transfer orders   LOS: 2 days    Gilford Raid K 04/13/2014

## 2014-04-14 DIAGNOSIS — Z954 Presence of other heart-valve replacement: Secondary | ICD-10-CM

## 2014-04-14 LAB — BASIC METABOLIC PANEL
Anion gap: 5 (ref 5–15)
BUN: 11 mg/dL (ref 6–23)
CO2: 29 mmol/L (ref 19–32)
Calcium: 8.7 mg/dL (ref 8.4–10.5)
Chloride: 106 mmol/L (ref 96–112)
Creatinine, Ser: 1.07 mg/dL (ref 0.50–1.35)
GFR calc Af Amer: 77 mL/min — ABNORMAL LOW (ref >90)
GFR, EST NON AFRICAN AMERICAN: 67 mL/min — AB (ref >90)
GLUCOSE: 101 mg/dL — AB (ref 70–99)
Potassium: 3.7 mmol/L (ref 3.5–5.1)
Sodium: 140 mmol/L (ref 135–145)

## 2014-04-14 MED ORDER — POTASSIUM CHLORIDE CRYS ER 10 MEQ PO TBCR
30.0000 meq | EXTENDED_RELEASE_TABLET | Freq: Once | ORAL | Status: AC
  Administered 2014-04-14: 30 meq via ORAL
  Filled 2014-04-14 (×2): qty 1

## 2014-04-14 MED ORDER — HYDROCHLOROTHIAZIDE 25 MG PO TABS
25.0000 mg | ORAL_TABLET | Freq: Every day | ORAL | Status: DC
Start: 2014-04-15 — End: 2014-04-15
  Administered 2014-04-15: 25 mg via ORAL
  Filled 2014-04-14: qty 1

## 2014-04-14 NOTE — Progress Notes (Addendum)
TCTS DAILY ICU PROGRESS NOTE                   [address]            [place] [address]          [phone]   3 Days Post-Op Procedure(s) (LRB): AORTIC VALVE REPLACEMENT (AVR) (N/A) TRANSESOPHAGEAL ECHOCARDIOGRAM (TEE) (N/A)  Total Length of Stay:  LOS: 3 days   Subjective: Patient tired. Wants to get out of ICU as wants to go home soon as possible.  Objective: Vital signs in last 24 hours: Temp:  [98.2 F (36.8 C)-99.9 F (37.7 C)] 99.9 F (37.7 C) (02/18 0741) Pulse Rate:  [84-97] 94 (02/18 0700) Cardiac Rhythm:  [-] Normal sinus rhythm (02/18 0600) Resp:  [16-33] 23 (02/18 0700) BP: (103-140)/(63-91) 138/75 mmHg (02/18 0600) SpO2:  [87 %-100 %] 97 % (02/18 0700) Weight:  [184 lb 8.4 oz (83.7 kg)] 184 lb 8.4 oz (83.7 kg) (02/18 0530)  Filed Weights   04/12/14 0500 04/13/14 0638 04/14/14 0530  Weight: 179 lb 3.7 oz (81.3 kg) 187 lb 2.7 oz (84.9 kg) 184 lb 8.4 oz (83.7 kg)      Intake/Output from previous day: 02/17 0701 - 02/18 0700 In: 800 [P.O.:720; I.V.:80] Out: 2140 [Urine:2140]     Current Meds: Scheduled Meds: . antiseptic oral rinse  7 mL Mouth Rinse BID  . aspirin EC  325 mg Oral Daily  . docusate sodium  200 mg Oral Daily  . metoprolol tartrate  25 mg Oral BID  . pantoprazole  40 mg Oral QAC breakfast  . sodium chloride  3 mL Intravenous Q12H   Continuous Infusions:   PRN Meds:.sodium chloride, acetaminophen, bisacodyl **OR** bisacodyl, ondansetron **OR** ondansetron (ZOFRAN) IV, oxyCODONE, sodium chloride, traMADol  General appearance: alert, cooperative and no distress Neurologic: intact Heart: RRR, no murmur Lungs: Diminished at bases Abdomen: soft, non-tender; bowel sounds normal; no masses,  no organomegaly Extremities: No LE edema Wound: Dressing dry and intact  Lab Results: CBC:  Recent Labs  04/12/14 1658 04/13/14 0500  WBC 9.3 8.4  HGB 10.7*  10.9* 10.3*  HCT 31.6*  32.0* 31.6*  PLT 115* 107*   BMET:     Recent Labs  04/13/14 0500 04/14/14 0413  NA 138 140  K 3.9 3.7  CL 105 106  CO2 26 29  GLUCOSE 104* 101*  BUN 9 11  CREATININE 1.00 1.07  CALCIUM 8.4 8.7    PT/INR:   Recent Labs  04/11/14 1200  LABPROT 17.3*  INR 1.40   Radiology: Dg Chest Port 1 View  04/13/2014   CLINICAL DATA:  Aortic insufficiency, status post aortic valve replacement  EXAM: PORTABLE CHEST - 1 VIEW  COMPARISON:  April 12, 2014  FINDINGS: The chest tubes bilaterally have been removed. Cordis tip is in the superior vena cava. There is no appreciable pneumothorax. There is patchy consolidation in the left lower lobe, stable. There is mild right base atelectasis medially. There is no new opacity. Heart is mildly enlarged with pulmonary vascularity. Patient is status post aortic valve replacement.  IMPRESSION: No pneumothorax following chest tube removal bilaterally. Cordis tip in superior vena cava. Left base consolidation with right base subsegmental atelectasis. No change in cardiac silhouette.   Electronically Signed   By: Lowella Grip III M.D.   On: 04/13/2014 07:27     Assessment/Plan: S/P Procedure(s) (LRB): AORTIC VALVE REPLACEMENT (AVR) (N/A) TRANSESOPHAGEAL ECHOCARDIOGRAM (TEE) (N/A)  1.CV-SR in the  80's-low 90's this am. On Lopressor 25 bid. Will restart HCTZ in am for bette BP control. 2.Pulmonary-Was on 2 liters of oxygen via [place], but now on room air.Encourage incentive spirometer 3.Supplement potassium 4.ABL anemia-H and H 10.4 and 31 5.Mild thrombocytopenia-platelets 107,000 6. Transfer to 2000   Nani Skillern PA-C 04/14/2014 8:18 AM   Chart reviewed, patient examined, agree with above. He is doing well. Plan home tomorrow if no changes.

## 2014-04-14 NOTE — Progress Notes (Signed)
Courtesy visit. He is doing well.

## 2014-04-14 NOTE — Progress Notes (Signed)
UR Completed.  [phone]  

## 2014-04-15 MED ORDER — LACTULOSE 10 GM/15ML PO SOLN
20.0000 g | Freq: Every day | ORAL | Status: DC | PRN
  Administered 2014-04-15: 20 g via ORAL
  Filled 2014-04-15 (×2): qty 30

## 2014-04-15 MED ORDER — ASPIRIN 325 MG PO TBEC: 325.0000 mg | DELAYED_RELEASE_TABLET | Freq: Every day | ORAL | Status: DC

## 2014-04-15 MED ORDER — METOPROLOL TARTRATE 25 MG PO TABS: 25.0000 mg | ORAL_TABLET | Freq: Two times a day (BID) | ORAL | Status: AC

## 2014-04-15 MED ORDER — OXYCODONE HCL 5 MG PO TABS: 5.0000 mg | ORAL_TABLET | ORAL | Status: DC | PRN

## 2014-04-15 NOTE — Progress Notes (Signed)
04/15/2014 12:06 PM Pt. Viewed educational video [phone]. Questions and concerns addressed.  Frederick Burnett, Frederick Burnett

## 2014-04-15 NOTE — Progress Notes (Signed)
Medicare Important Message given? YES  (If response is "NO", the following Medicare IM given date fields will be blank)  Date Medicare IM given: 04/15/14 Medicare IM given by:  Lora Glomski  

## 2014-04-15 NOTE — Progress Notes (Signed)
04/15/2014 11:09 AM EPW d/c per order and per protocol. Ends intact. Pt. Tolerated well. Advised BR x 1 HR. Call bell within reach. VS collected per protocol. Will continue to monitor patient.  cts d/c per order and per protocol. Benzoin and steri strips applied.  Monish Haliburton, Arville Lime

## 2014-04-15 NOTE — Discharge Instructions (Signed)
Aortic Valve Replacement, Care After °Refer to this sheet in the next few weeks. These instructions provide you with information on caring for yourself after your procedure. Your health care provider may also give you specific instructions. Your treatment has been planned according to current medical practices, but problems sometimes occur. Call your health care provider if you have any problems or questions after your procedure. °HOME CARE INSTRUCTIONS  °· Take medicines only as directed by your health care provider. °· If your health care provider has prescribed elastic stockings, wear them as directed. °· Take frequent naps or rest often throughout the day. °· Avoid lifting over 10 lbs (4.5 kg) or pushing or pulling things with your arms for 6-8 weeks or as directed by your health care provider. °· Avoid driving or airplane travel for 4-6 weeks after surgery or as directed by your health care provider. If you are riding in a car for an extended period, stop every 1-2 hours to stretch your legs. Keep a record of your medicines and medical history with you when traveling. °· Do not drive or operate heavy machinery while taking pain medicine. (narcotics). °· Do not cross your legs. °· Do not use any tobacco products including cigarettes, chewing tobacco, or electronic cigarettes. If you need help quitting, ask your health care provider. °· Do not take baths, swim, or use a hot tub until your health care provider approves. Take showers once your health care provider approves. Pat incisions dry. Do not rub incisions with a washcloth or towel. °· Avoid climbing stairs and using the handrail to pull yourself up for the first 2-3 weeks after surgery. °· Return to work as directed by your health care provider. °· Drink enough fluid to keep your urine clear or pale yellow. °· Do not strain to have a bowel movement. Eat high-fiber foods if you become constipated. You may also take a medicine to help you have a bowel  movement (laxative) as directed by your health care provider. °· Resume sexual activity as directed by your health care provider. Men should not use medicines for erectile dysfunction until their doctor says it is okay. °· If you had a certain type of heart condition in the past, you may need to take antibiotic medicine before having dental work or surgery. Let your dentist and health care providers know if you had one or more of the following: °¨ Previous endocarditis. °¨ An artificial (prosthetic) heart valve. °¨ Congenital heart disease. °SEEK MEDICAL CARE IF: °· You develop a skin rash.   °· You experience sudden changes in your weight. °· You have a fever. °SEEK IMMEDIATE MEDICAL CARE IF:  °· You develop chest pain that is not coming from your incision. °· You have drainage (pus), redness, swelling, or pain at your incision site.   °· You develop shortness of breath or have difficulty breathing.   °· You have increased bleeding from your incision site.   °· You develop light-headedness.   °MAKE SURE YOU:  °· Understand these directions. °· Will watch your condition. °· Will get help right away if you are not doing well or get worse. °Document Released: 08/30/2004 Document Revised: 06/28/2013 Document Reviewed: 11/26/2011 °Exit[employer]® Patient Information ©2015 [employer]. This information is not intended to replace advice given to you by your health care provider. Make sure you discuss any questions you have with your health care provider. ° °

## 2014-04-15 NOTE — Progress Notes (Signed)
04/15/2014 4:06 PM Dc avs form, medications already taken today and those due this evening given and explained to patient and family. Follow up appointments, incision site care, activity restrictions and when to call MD reviewed. Moving right along book reviewed. rx given and reviewed. Pt. Had large bm prior to d/c home. Pt. States he has walker at home. Pt. Has been ambulating in room independently without difficulty and plans to stay with his daughter after discharge. Discharge plan reviewed with daughter prior to d/c home.  Questions and concerns addressed. D/c iv. D/c tele. D/c home per orders. Aundra Pung, Arville Lime

## 2014-04-15 NOTE — Progress Notes (Signed)
[id number] Pt did not want to walk at this time. Going home soon and wanted to save energy. Reviewed sternal precautions, IS, watching sodium, and ex ed. Pt and sister voiced understanding. Pt has walker at home to use. Discussed CRP2 and pt declined. Wants to ex on his own. Graylon Good RN BSN 04/15/2014 1:54 PM

## 2014-04-15 NOTE — Progress Notes (Addendum)
      [address]       [place] [address]             [phone]      4 Days Post-Op Procedure(s) (LRB): AORTIC VALVE REPLACEMENT (AVR) (N/A) TRANSESOPHAGEAL ECHOCARDIOGRAM (TEE) (N/A)   Subjective:  Frederick Burnett has no complaints.  He is hoping to go home today.  + ambulation  No BM yet  Objective: Vital signs in last 24 hours: Temp:  [98.2 F (36.8 C)-100.3 F (37.9 C)] 100.3 F (37.9 C) (02/19 0535) Pulse Rate:  [81-92] 86 (02/19 0535) Cardiac Rhythm:  [-] Normal sinus rhythm (02/19 0012) Resp:  [17-32] 18 (02/19 0535) BP: (105-137)/(68-99) 133/90 mmHg (02/19 0535) SpO2:  [93 %-99 %] 96 % (02/19 0535) Weight:  [184 lb 15.5 oz (83.9 kg)] 184 lb 15.5 oz (83.9 kg) (02/19 0535)  Intake/Output from previous day: 02/18 0701 - 02/19 0700 In: 560 [P.O.:560] Out: 700 [Urine:700]  General appearance: alert, cooperative and no distress Heart: regular rate and rhythm Lungs: clear to auscultation bilaterally Abdomen: soft, non-tender; bowel sounds normal; no masses,  no organomegaly Extremities: edema none appreciated Wound: clean and dry  Lab Results:  Recent Labs  04/12/14 1658 04/13/14 0500  WBC 9.3 8.4  HGB 10.7*  10.9* 10.3*  HCT 31.6*  32.0* 31.6*  PLT 115* 107*   BMET:  Recent Labs  04/13/14 0500 04/14/14 0413  NA 138 140  K 3.9 3.7  CL 105 106  CO2 26 29  GLUCOSE 104* 101*  BUN 9 11  CREATININE 1.00 1.07  CALCIUM 8.4 8.7    PT/INR: No results for input(s): LABPROT, INR in the last 72 hours. ABG    Component Value Date/Time   PHART 7.323* 04/11/2014 1648   HCO3 25.2* 04/11/2014 1648   TCO2 20 04/12/2014 1658   ACIDBASEDEF 1.0 04/11/2014 1648   O2SAT 97.0 04/11/2014 1648   CBG (last 3)   Recent Labs  04/13/14 1238 04/13/14 1554 04/13/14 1944  GLUCAP 104* 113* 120*    Assessment/Plan: S/P Procedure(s) (LRB): AORTIC VALVE REPLACEMENT (AVR) (N/A) TRANSESOPHAGEAL ECHOCARDIOGRAM (TEE) (N/A)  1. CV- NSR, blood pressure  control better- continue Lopressor, continue home HCTZ 2. Pulm- no acute issues, continue IS 3. Renal-creatinine has been WNL, weight is below back to basline 4. GI- constipation, will add lacutlose 5. Dispo- patient stable, will d/c EPW today- if no issues and moves bowels can go this afternoon   LOS: 4 days    Ellwood Handler 04/15/2014   Chart reviewed, patient examined, agree with above.

## 2014-04-15 NOTE — Discharge Summary (Signed)
Physician Discharge Summary  Patient ID: GER RINGENBERG MRN: [id number] DOB/AGE: 1940-02-27 74 y.o.  Admit date: 04/11/2014 Discharge date: 04/15/2014  Admission Diagnoses:  Patient Active Problem List   Diagnosis Date Noted  . Aortic regurgitation   . Aortic valve disorder 12/29/2012    Class: Chronic  . Aortic root enlargement 12/29/2012    Class: Chronic  . Essential hypertension 12/29/2012    Class: Chronic   Discharge Diagnoses:   Patient Active Problem List   Diagnosis Date Noted  . S/P AVR 04/11/2014  . Aortic regurgitation   . Aortic valve disorder 12/29/2012    Class: Chronic  . Aortic root enlargement 12/29/2012    Class: Chronic  . Essential hypertension 12/29/2012    Class: Chronic   Discharged Condition: good  History of Present Illness:   Mr. Morandi is a 74 yo African American male with known history of Hypertension and Hypercholesterolemia.  He was undergoing a routine physical and he was noted to have murmur.  He was therefore referred to Cardiology and was evaluated by Dr. Faythe Casa in 2014.  At that time he underwent Echocardiogram which showed the presence of moderate Aortic Insufficiency.  CTA of the chest was also obtained at that time and showed no evidence of aortic dilatation or aneurysm.  Since that time he has been routinely followed by Dr. Tamala Julian.  His most recent Echo done on 03/24/2014 showed progression of his Aortic disease.  He was found to have a reduced EF of 35-40%, mild LV dilation and moderate LV dysfunction.  There was also severe Aortic Insufficiency.  Due to this it was felt the patient would benefit from surgical intervention.  The patient was evaluated by Dr. Cyndia Bent on 04/01/2014.  At that time the patient's Echocardiogram and CT scan were reviewed.  He had underwent cardiac catheterization in 01/2014 which did not show evidence of coronary disease.  Therefore it was felt the patient should undergo intervention on his Aortic Valve.    Hospital  Course:   Mr. Jean presented to [facility] on 04/11/2014.  He was taken to the operating room and underwent Aortic Valve Replacement utilizing a 23 mm [facility] Ease Pericardial Tissue Valve.  He tolerated the procedure without difficulty and was taken to the SICU in stable condition.  The patient was extubated the evening of surgery.  During his stay in the SICU the patient was weaned off Neo Synephrine as tolerated.  His chest tubes and arterial lines were removed without difficulty.  He was maintaining NSR and felt medically stable for transfer to the telemetry unit of POD #2.  The patient continued to progress.  He continued to maintain NSR without issue and his pacing wires were discontinued.  He has been resumed on his home blood pressure medications.  He is ambulating without difficulty and tolerating a regular diet.  He is medically stable and will be discharged home today 04/15/2014.        Significant Diagnostic Studies: cardiac graphics: Echocardiogram:   Left ventricle: The cavity size was mildly dilated. There was mild concentric hypertrophy. Systolic function was moderately reduced. The estimated ejection fraction was in the range of 35% to 40%. Diffuse hypokinesis. - Aortic valve: Aortic valve leaflets are mildly thickened and non-coapting. There is severe aortic regurgitation and no stenosis. Vena contracta is > 10 mm. Aortic insufficiency jet fill almost the entire LVOT. - Ascending aorta: The ascending aorta was normal in size measuring 37 mm. This might be underestimated on  TEE. For accurate measurement consider CT. - Descending aorta: The descending aorta is mildly dilated measuring 29 mm. - Mitral valve: Mitral valve leaflets are structurally normal. The leaflets are thethered by LV dilatation resulting in a central jet with mild to moderate mitral regurgitation. - Left atrium: The atrium was normal in size. No evidence of thrombus in the  atrial cavity or appendage. No evidence of thrombus in the atrial cavity or appendage. No evidence of thrombus in the appendage. - Right ventricle: Systolic function was normal. - Right atrium: No evidence of thrombus in the atrial cavity or appendage. No evidence of thrombus in the atrial cavity or appendage. - Atrial septum: No defect or patent foramen ovale was identified. Echo contrast study showed no right-to-left atrial level shunt, following an increase in RA pressure induced by provocative maneuvers. - Tricuspid valve: There was trivial regurgitation. - Pulmonic valve: No evidence of vegetation.  Treatments: surgery:   1. Median Sternotomy 2. Extracorporeal circulation 3. Aortic valve replacement using a 23 mm [employer] Magna-Ease pericardial valve.  Disposition: Home  Discharge Medications:     Medication List    TAKE these medications        acetaminophen 500 MG tablet  Commonly known as:  TYLENOL  Take 1,000 mg by mouth every 6 (six) hours as needed.     aspirin 325 MG EC tablet  Take 1 tablet (325 mg total) by mouth daily.     hydrochlorothiazide 25 MG tablet  Commonly known as:  HYDRODIURIL  Take 25 mg by mouth daily.     metoprolol tartrate 25 MG tablet  Commonly known as:  LOPRESSOR  Take 1 tablet (25 mg total) by mouth 2 (two) times daily.     multivitamin capsule  Take 1 capsule by mouth daily.     oxyCODONE 5 MG immediate release tablet  Commonly known as:  Oxy IR/ROXICODONE  Take 1-2 tablets (5-10 mg total) by mouth every 3 (three) hours as needed for severe pain.     potassium chloride 10 MEQ tablet  Commonly known as:  K-DUR,KLOR-CON  Take 10 mEq by mouth daily.        The patient has been discharged on:   1.Beta Blocker:  Yes [ x  ]                              No   [   ]                              If No, reason:  2.Ace Inhibitor/ARB: Yes [   ]                                     No  [ x   ]                                      If No, reason: No CAD, labile blood pressure  3.Statin:   Yes [   ]                  No  [ x  ]  If No, reason: No CAD   4.Shela Commons:  Yes  [ x  ]                  No   [   ]                  If No, reason:    Medication List    TAKE these medications        acetaminophen 500 MG tablet  Commonly known as:  TYLENOL  Take 1,000 mg by mouth every 6 (six) hours as needed.     aspirin 325 MG EC tablet  Take 1 tablet (325 mg total) by mouth daily.     hydrochlorothiazide 25 MG tablet  Commonly known as:  HYDRODIURIL  Take 25 mg by mouth daily.     metoprolol tartrate 25 MG tablet  Commonly known as:  LOPRESSOR  Take 1 tablet (25 mg total) by mouth 2 (two) times daily.     multivitamin capsule  Take 1 capsule by mouth daily.     oxyCODONE 5 MG immediate release tablet  Commonly known as:  Oxy IR/ROXICODONE  Take 1-2 tablets (5-10 mg total) by mouth every 3 (three) hours as needed for severe pain.     potassium chloride 10 MEQ tablet  Commonly known as:  K-DUR,KLOR-CON  Take 10 mEq by mouth daily.             Follow-up Information    Follow up with Gaye Pollack, MD.   Specialty:  Cardiothoracic Surgery   Contact information:   [place] [address] [phone]       Follow up with [facility].   Contact information:   [address]       Signed: Ellwood Handler 04/15/2014, 8:41 AM

## 2014-04-24 ENCOUNTER — Emergency Department (HOSPITAL_COMMUNITY): Payer: Medicare Other

## 2014-04-24 ENCOUNTER — Inpatient Hospital Stay (HOSPITAL_COMMUNITY)
Admission: EM | Admit: 2014-04-24 | Discharge: 2014-04-26 | DRG: 176 | Disposition: A | Discharge status: Home / Self Care | Payer: Medicare Other | Attending: Internal Medicine | Admitting: Internal Medicine

## 2014-04-24 ENCOUNTER — Encounter (HOSPITAL_COMMUNITY): Payer: Self-pay | Admitting: Emergency Medicine

## 2014-04-24 DIAGNOSIS — I5022 Chronic systolic (congestive) heart failure: Secondary | ICD-10-CM | POA: Diagnosis present

## 2014-04-24 DIAGNOSIS — R079 Chest pain, unspecified: Secondary | ICD-10-CM

## 2014-04-24 DIAGNOSIS — I2699 Other pulmonary embolism without acute cor pulmonale: Principal | ICD-10-CM

## 2014-04-24 DIAGNOSIS — E780 Pure hypercholesterolemia: Secondary | ICD-10-CM | POA: Diagnosis present

## 2014-04-24 DIAGNOSIS — Z952 Presence of prosthetic heart valve: Secondary | ICD-10-CM

## 2014-04-24 DIAGNOSIS — I10 Essential (primary) hypertension: Secondary | ICD-10-CM | POA: Diagnosis present

## 2014-04-24 DIAGNOSIS — D62 Acute posthemorrhagic anemia: Secondary | ICD-10-CM | POA: Diagnosis present

## 2014-04-24 DIAGNOSIS — R0781 Pleurodynia: Secondary | ICD-10-CM | POA: Insufficient documentation

## 2014-04-24 DIAGNOSIS — E785 Hyperlipidemia, unspecified: Secondary | ICD-10-CM | POA: Diagnosis present

## 2014-04-24 DIAGNOSIS — Z86711 Personal history of pulmonary embolism: Secondary | ICD-10-CM

## 2014-04-24 DIAGNOSIS — I82403 Acute embolism and thrombosis of unspecified deep veins of lower extremity, bilateral: Secondary | ICD-10-CM

## 2014-04-24 DIAGNOSIS — Z954 Presence of other heart-valve replacement: Secondary | ICD-10-CM

## 2014-04-24 DIAGNOSIS — O2230 Deep phlebothrombosis in pregnancy, unspecified trimester: Secondary | ICD-10-CM | POA: Diagnosis present

## 2014-04-24 DIAGNOSIS — I82442 Acute embolism and thrombosis of left tibial vein: Secondary | ICD-10-CM | POA: Diagnosis present

## 2014-04-24 DIAGNOSIS — I359 Nonrheumatic aortic valve disorder, unspecified: Secondary | ICD-10-CM

## 2014-04-24 DIAGNOSIS — Z87891 Personal history of nicotine dependence: Secondary | ICD-10-CM

## 2014-04-24 DIAGNOSIS — M79609 Pain in unspecified limb: Secondary | ICD-10-CM

## 2014-04-24 DIAGNOSIS — Z8249 Family history of ischemic heart disease and other diseases of the circulatory system: Secondary | ICD-10-CM | POA: Diagnosis not present

## 2014-04-24 DIAGNOSIS — Z86718 Personal history of other venous thrombosis and embolism: Secondary | ICD-10-CM | POA: Diagnosis present

## 2014-04-24 DIAGNOSIS — I82411 Acute embolism and thrombosis of right femoral vein: Secondary | ICD-10-CM | POA: Diagnosis present

## 2014-04-24 HISTORY — DX: Personal history of pulmonary embolism: Z86.711

## 2014-04-24 LAB — CBC WITH DIFFERENTIAL/PLATELET
Basophils Absolute: 0 10*3/uL (ref 0.0–0.1)
Basophils Relative: 0 % (ref 0–1)
EOS ABS: 0.3 10*3/uL (ref 0.0–0.7)
Eosinophils Relative: 3 % (ref 0–5)
HCT: 32.8 % — ABNORMAL LOW (ref 39.0–52.0)
Hemoglobin: 10.7 g/dL — ABNORMAL LOW (ref 13.0–17.0)
LYMPHS ABS: 1.4 10*3/uL (ref 0.7–4.0)
LYMPHS PCT: 16 % (ref 12–46)
MCH: 27.1 pg (ref 26.0–34.0)
MCHC: 32.6 g/dL (ref 30.0–36.0)
MCV: 83 fL (ref 78.0–100.0)
Monocytes Absolute: 0.9 10*3/uL (ref 0.1–1.0)
Monocytes Relative: 10 % (ref 3–12)
NEUTROS ABS: 6.4 10*3/uL (ref 1.7–7.7)
NEUTROS PCT: 71 % (ref 43–77)
Platelets: 272 10*3/uL (ref 150–400)
RBC: 3.95 MIL/uL — AB (ref 4.22–5.81)
RDW: 12.3 % (ref 11.5–15.5)
WBC: 9 10*3/uL (ref 4.0–10.5)

## 2014-04-24 LAB — I-STAT CHEM 8, ED
BUN: 13 mg/dL (ref 6–23)
Calcium, Ion: 1.21 mmol/L (ref 1.13–1.30)
Chloride: 104 mmol/L (ref 96–112)
Creatinine, Ser: 1 mg/dL (ref 0.50–1.35)
Glucose, Bld: 140 mg/dL — ABNORMAL HIGH (ref 70–99)
HEMATOCRIT: 36 % — AB (ref 39.0–52.0)
HEMOGLOBIN: 12.2 g/dL — AB (ref 13.0–17.0)
POTASSIUM: 3.6 mmol/L (ref 3.5–5.1)
Sodium: 140 mmol/L (ref 135–145)
TCO2: 20 mmol/L (ref 0–100)

## 2014-04-24 LAB — D-DIMER, QUANTITATIVE (NOT AT ARMC)

## 2014-04-24 LAB — TROPONIN I: TROPONIN I: 0.08 ng/mL — AB (ref <0.031)

## 2014-04-24 LAB — APTT: aPTT: 39 seconds — ABNORMAL HIGH (ref 24–37)

## 2014-04-24 LAB — PROTIME-INR
INR: 1.21 (ref 0.00–1.49)
PROTHROMBIN TIME: 15.4 s — AB (ref 11.6–15.2)

## 2014-04-24 LAB — ANTITHROMBIN III: ANTITHROMB III FUNC: 119 % (ref 75–120)

## 2014-04-24 MED ORDER — HEPARIN (PORCINE) IN NACL 100-0.45 UNIT/ML-% IJ SOLN
1350.0000 [IU]/h | INTRAMUSCULAR | Status: DC
  Administered 2014-04-24: 1250 [IU]/h via INTRAVENOUS
  Administered 2014-04-25 – 2014-04-26 (×2): 1350 [IU]/h via INTRAVENOUS
  Filled 2014-04-24 (×4): qty 250

## 2014-04-24 MED ORDER — HYDRALAZINE HCL 20 MG/ML IJ SOLN: 5.0000 mg | INTRAMUSCULAR | Status: DC | PRN

## 2014-04-24 MED ORDER — OXYCODONE-ACETAMINOPHEN 5-325 MG PO TABS: 2.0000 | ORAL_TABLET | Freq: Four times a day (QID) | ORAL | Status: DC | PRN

## 2014-04-24 MED ORDER — MORPHINE SULFATE 2 MG/ML IJ SOLN: 2.0000 mg | INTRAMUSCULAR | Status: DC | PRN

## 2014-04-24 MED ORDER — ASPIRIN EC 325 MG PO TBEC
325.0000 mg | DELAYED_RELEASE_TABLET | Freq: Every day | ORAL | Status: DC
  Administered 2014-04-25: 325 mg via ORAL
  Filled 2014-04-24: qty 1

## 2014-04-24 MED ORDER — IOHEXOL 350 MG/ML SOLN
80.0000 mL | Freq: Once | INTRAVENOUS | Status: AC | PRN
  Administered 2014-04-24: 80 mL via INTRAVENOUS

## 2014-04-24 MED ORDER — ACETAMINOPHEN 325 MG PO TABS: 650.0000 mg | ORAL_TABLET | Freq: Four times a day (QID) | ORAL | Status: DC | PRN

## 2014-04-24 MED ORDER — SODIUM CHLORIDE 0.9 % IV SOLN
INTRAVENOUS | Status: DC
Start: 2014-04-24 — End: 2014-04-26
  Administered 2014-04-24: via INTRAVENOUS

## 2014-04-24 MED ORDER — ASPIRIN EC 325 MG PO TBEC: 325.0000 mg | DELAYED_RELEASE_TABLET | Freq: Every day | ORAL | Status: DC

## 2014-04-24 MED ORDER — MULTIVITAMINS PO CAPS: 1.0000 | ORAL_CAPSULE | Freq: Every day | ORAL | Status: DC

## 2014-04-24 MED ORDER — METOPROLOL TARTRATE 25 MG PO TABS
25.0000 mg | ORAL_TABLET | Freq: Two times a day (BID) | ORAL | Status: DC
  Administered 2014-04-24 – 2014-04-26 (×4): 25 mg via ORAL
  Filled 2014-04-24 (×5): qty 1

## 2014-04-24 MED ORDER — ADULT MULTIVITAMIN W/MINERALS CH
1.0000 | ORAL_TABLET | Freq: Every day | ORAL | Status: DC
  Administered 2014-04-25 – 2014-04-26 (×2): 1 via ORAL
  Filled 2014-04-24 (×2): qty 1

## 2014-04-24 MED ORDER — HEPARIN BOLUS VIA INFUSION
4000.0000 [IU] | Freq: Once | INTRAVENOUS | Status: AC
  Administered 2014-04-24: 4000 [IU] via INTRAVENOUS
  Filled 2014-04-24: qty 4000

## 2014-04-24 MED ORDER — SODIUM CHLORIDE 0.9 % IJ SOLN
3.0000 mL | Freq: Two times a day (BID) | INTRAMUSCULAR | Status: DC
  Administered 2014-04-25 – 2014-04-26 (×2): 3 mL via INTRAVENOUS

## 2014-04-24 MED ORDER — ALUM & MAG HYDROXIDE-SIMETH 200-200-20 MG/5ML PO SUSP: 30.0000 mL | Freq: Four times a day (QID) | ORAL | Status: DC | PRN

## 2014-04-24 NOTE — H&P (Addendum)
[facility] History and Physical  Frederick Burnett BOF:[id number] DOB: 26-Mar-1940 DOA: 04/24/2014  Referring physician: ED physician PCP: Donnie Coffin, MD  Specialists:   Chief Complaint: pain over left calf area  HPI: Frederick Burnett is a 74 y.o. male with PMH of HTN, HLD, aortic valvular disease, status post aortic valve replacement with bovine valve on 04/11/14 (not on anticoagulation), who presents with pain over left calf area.  Patient reports that he had aortic valve placement on 04/11/14, not currently taking anticoagulants. He has been doing fine until 4 days ago when he started having mild tenderness over left calf area. No tenderness over the right leg. Patient does not have chest pain, cough, shortness of breath. Patient denies fever, chills, headaches, abdominal pain, diarrhea, constipation, dysuria, urgency, frequency, hematuria, skin rashes, unilateral weakness, numbness or tingling sensations. No vision change or hearing loss.  In ED, patient was found to have tachycardia, temperature 99, anemia with hemoglobin 10.7, WBC 9.0, electrolytes okay, INR 1.21, PTT 39, elevated d-dimer. CTA, LE dopper and 2d echo were performed, the results are follows:  LE dopper: There is acute DVT noted throughout the left lower extremity, beginning in the posterior tibial vein coursing through to the popliteal, femoral, and common femoral veins.  There is acute DVT noted in the right mid to distal femoral vein.  CTA: Bilateral pulmonary emboli, right larger than left involving the right upper lobe, and both lower lobes. No right hear straining. 2D echo: Mildly dilated LV with moderately impaired systolic function, LVEF 77-82%. Mild to moderate functional MR. Severe AI with upper normal aortic root size and normal size   ascending aorta.   ED physician, Dr. Sabra Heck discussed that with cardiovascular surgeon, Dr. Roxy Manns, who suggested to admit pt to inpatient for IV heparin treatment.  Review of  Systems: As presented in the history of presenting illness, rest negative.  Where does patient live?  At home Can patient participate in ADLs? little  Allergy: No Known Allergies  Past Medical History  Diagnosis Date  . Hypercholesteremia   . HTN (hypertension)   . Aortic regurgitation   . Aortic root enlargement   . Aortic regurgitation   . Heart murmur   . Arthritis     both shoulders - RC- wear    Past Surgical History  Procedure Laterality Date  . Multiple tooth extractions    . Left and right heart catheterization with coronary angiogram N/A 02/22/2014    Procedure: LEFT AND RIGHT HEART CATHETERIZATION WITH CORONARY ANGIOGRAM;  Surgeon: Sinclair Grooms, MD;  Location[facility] LAB;  Service: Cardiovascular;  Laterality: N/A;  . Cardiac catheterization    . Tee without cardioversion N/A 03/24/2014    Procedure: TRANSESOPHAGEAL ECHOCARDIOGRAM (TEE);  Surgeon: Dorothy Spark, MD;  Location: [place];  Service: Cardiovascular;  Laterality: N/A;  . Aortic valve replacement N/A 04/11/2014    Procedure: AORTIC VALVE REPLACEMENT (AVR);  Surgeon: Gaye Pollack, MD;  Location: [place];  Service: Open Heart Surgery;  Laterality: N/A;  . Tee without cardioversion N/A 04/11/2014    Procedure: TRANSESOPHAGEAL ECHOCARDIOGRAM (TEE);  Surgeon: Gaye Pollack, MD;  Location: [place];  Service: Open Heart Surgery;  Laterality: N/A;    Social History:  reports that he quit smoking about 12 years ago. His smoking use included Cigars. He does not have any smokeless tobacco history on file. He reports that he does not drink alcohol or use illicit drugs.  Family History:  Family History  Problem  Relation Age of Onset  . Hypertension Mother      Prior to Admission medications   Medication Sig Start Date End Date Taking? Authorizing Provider  acetaminophen (TYLENOL) 500 MG tablet Take 1,000 mg by mouth every 6 (six) hours as needed.   Yes Historical Provider, MD  hydrochlorothiazide  (HYDRODIURIL) 25 MG tablet Take 25 mg by mouth daily.   Yes Historical Provider, MD  metoprolol tartrate (LOPRESSOR) 25 MG tablet Take 1 tablet (25 mg total) by mouth 2 (two) times daily. 04/15/14  Yes Erin Barrett, PA-C  Multiple Vitamin (MULTIVITAMIN) capsule Take 1 capsule by mouth daily.   Yes Historical Provider, MD  oxyCODONE (OXY IR/ROXICODONE) 5 MG immediate release tablet Take 1-2 tablets (5-10 mg total) by mouth every 3 (three) hours as needed for severe pain. 04/15/14  Yes Erin Barrett, PA-C  potassium chloride (K-DUR,KLOR-CON) 10 MEQ tablet Take 10 mEq by mouth daily.    Yes Historical Provider, MD  aspirin EC 325 MG EC tablet Take 1 tablet (325 mg total) by mouth daily. Patient not taking: Reported on 04/24/2014 04/15/14   Ellwood Handler, PA-C    Physical Exam: Filed Vitals:   04/24/14 1900 04/24/14 1915 04/24/14 1945 04/24/14 2000  BP: 118/82 111/80 120/80 108/77  Pulse: 107 105 103 107  Temp:      Resp: 16 20 17 24   Height:      Weight:      SpO2: 100% 97% 97% 97%   General: Not in acute distress HEENT:       Eyes: PERRL, EOMI, no scleral icterus       ENT: No discharge from the ears and nose, no pharynx injection, no tonsillar enlargement.        Neck: No JVD, no bruit, no mass felt. Cardiac: Y8/M5, RRR, diastolic murmurs over AV area, No gallops or rubs Pulm: Good air movement bilaterally. Clear to auscultation bilaterally. No rales, wheezing, rhonchi or rubs. Abd: Soft, nondistended, nontender, no rebound pain, no organomegaly, BS present Ext: No edema bilaterally. 2+DP/PT pulse bilaterally. Mild tenderness over the left calf area, no obvious cord structure detected. Musculoskeletal: No joint deformities, erythema, or stiffness, ROM full Skin: No rashes.  Neuro: Alert and oriented X3, cranial nerves II-XII grossly intact, muscle strength 5/5 in all extremeties, sensation to light touch intact.  Psych: Patient is not psychotic, no suicidal or hemocidal ideation.  Labs on  Admission:  Basic Metabolic Panel:  Recent Labs Lab 04/24/14 1448  NA 140  K 3.6  CL 104  GLUCOSE 140*  BUN 13  CREATININE 1.00   Liver Function Tests: No results for input(s): AST, ALT, ALKPHOS, BILITOT, PROT, ALBUMIN in the last 168 hours. No results for input(s): LIPASE, AMYLASE in the last 168 hours. No results for input(s): AMMONIA in the last 168 hours. CBC:  Recent Labs Lab 04/24/14 1448 04/24/14 1622  WBC  --  9.0  NEUTROABS  --  6.4  HGB 12.2* 10.7*  HCT 36.0* 32.8*  MCV  --  83.0  PLT  --  272   Cardiac Enzymes: No results for input(s): CKTOTAL, CKMB, CKMBINDEX, TROPONINI in the last 168 hours.  BNP (last 3 results) No results for input(s): BNP in the last 8760 hours.  ProBNP (last 3 results) No results for input(s): PROBNP in the last 8760 hours.  CBG: No results for input(s): GLUCAP in the last 168 hours.  Radiological Exams on Admission: Ct Angio Chest Pe W/cm &/or Wo Cm  04/24/2014   CLINICAL  DATA:  Recent aortic valve replacement. Positive for pulmonary embolus per vascular lab notes.  EXAM: CT ANGIOGRAPHY CHEST WITH CONTRAST  TECHNIQUE: Multidetector CT imaging of the chest was performed using the standard protocol during bolus administration of intravenous contrast. Multiplanar CT image reconstructions and MIPs were obtained to evaluate the vascular anatomy.  CONTRAST:  49mL OMNIPAQUE IOHEXOL 350 MG/ML SOLN  COMPARISON:  01/05/2013  FINDINGS: There are bilateral pulmonary emboli, the largest on the right where the embolus extends from the the right upper lobes into the right lower lobe. Emboli also noted within the left lower lobe. There are linear areas of atelectasis dependently in the lower lobes bilaterally and in the lung bases. No effusions.  Heart is upper limits normal in size. Aortic valve replacement. No mediastinal, hilar, or axillary adenopathy. Chest wall soft tissues are unremarkable. Imaging into the upper abdomen shows no acute findings.   No acute bony abnormality.  Review of the MIP images confirms the above findings.  IMPRESSION: Bilateral pulmonary emboli, right larger than left involving the right upper lobe, and both lower lobes. Dependent and bibasilar atelectasis.  These results were called by telephone at the time of interpretation on 04/24/2014 at 7:46 pm to the PA taking care the patient, who verbally acknowledged these results.   Electronically Signed   By: Rolm Baptise M.D.   On: 04/24/2014 19:48    EKG: will get one  Assessment/Plan Active Problems:   Aortic valve disorder   Essential hypertension   S/P AVR   PE (pulmonary embolism)   DVT (deep vein thrombosis) in pregnancy  Acute PE and DVT: patient has bilateral pulmonary embolism and bilateral DVT. No R heart straining pattern. Patient currently does not have shortness of breath or chest pain. ED physician, Dr. Sabra Heck discussed that with cardiovascular surgeon, Dr. Roxy Manns, who suggested to admit pt to inpatient for IV heparin treatment and get consult as needed.   -will admit to SDU -admit to stepdown for close monitoring overnight -heparin drip initiated,  -EKG  -may get cardiology and IR consult in AM -Pain control: percocet for moderate pain and Morphine for severe pain -checking Lipid panel, serial CE's, Hb A1C, TSH, hypercoag panel -CBC daily -NPO -Gentle IVF: NS 50 cc/h  Aortic valvular disease: status post aortic valve replacement with bovine valve on 04/11/14 (not on anticoagulation). 2D echo today showed mildly dilated LV with moderately impaired systolic function, LVEF 57-84%. Mild to moderate functional MR. Severe AI with upper normal aortic root size and normal size ascending aorta.  -continue IV heparin now -May get card consult in AM -BNP  HTN: bp is 118/82 on admission. He is on hydrochlorothiazide at home. -Will hold hydrochlorothiazide in the setting of acute PE -continue metoprolol -start prn IV hydralazine, 5 mg q2h for  SBP>160  Anemia: Hgb is 10.7. MCV=83.0. Unclear etiology. -check anemia panel   DVT ppx: On IV Heparin      Code Status: Full code Family Communication:    Yes, patient's   son   at bed side Disposition Plan: Admit to inpatient   Date of Service 04/24/2014    Frederick Burnett [facility] Pager [phone]  If 7PM-7AM, please contact night-coverage [email or web address] Password [facility] 04/24/2014, 8:12 PM

## 2014-04-24 NOTE — ED Notes (Signed)
Patient transported to CT 

## 2014-04-24 NOTE — Progress Notes (Signed)
VASCULAR LAB PRELIMINARY  PRELIMINARY  PRELIMINARY  PRELIMINARY  Bilateral lower extremity venous Dopplers completed.    Preliminary report:  There is acute DVT noted throughout the left lower extremity, beginning in the posterior tibial vein coursing through to the popliteal, femoral, and common femoral veins.  There is acute DVT noted in the right mid to distal femoral vein.   Clayden Withem, RVT 04/24/2014, 5:05 PM

## 2014-04-24 NOTE — ED Provider Notes (Signed)
74 year old male who is approximately 2 weeks status post aortic valve replacement with bovine valve, not on anticoagulation who presents with mild discomfort on the left. He denies any obvious numbness or weakness, has a small amount of pain, this is made worse with palpation, denies chest pain shortness of breath weakness fevers chills nausea or vomiting. On exam he has no edema, scant asymmetry left greater than right at the calf, tenderness in the proximal calf posteriorly, normal capillary refill and pulses at the feet bilaterally, normal heart and lung sounds, soft murmur. Otherwise appears in no distress, lap show normal renal function, normal hemoglobin, E dimer significantly elevated but in this postoperative patient that would be expected. Ultrasound pending to evaluate for DVT.  The patient had an ultrasound performed showing acute DVT which was almost completely occlusive of the left lower extremity as well as some on the right. A CT scan was performed secondary to patient's tachycardia showing large areas of pulmonary emboli in the bilateral lungs, this does explain the tachycardia and is concerning in the postoperative period  The patient had intravenous heparin ordered, the patient is critically ill with a large pulmonary embolus, he will need to be admitted to the stepdown unit, discussed with the cardiothoracic surgeon as well as the hospitalist, the latter of who will admit.   EKG Interpretation  Date/Time:  Sunday April 24 2014 20:29:42 EST Ventricular Rate:  102 PR Interval:  126 QRS Duration: 98 QT Interval:  336 QTC Calculation: 438 R Axis:   27 Text Interpretation:  Ectopic atrial tachycardia, unifocal Atrial premature complexes Left ventricular hypertrophy Anterior Q waves, possibly due to LVH Abnrm T, consider ischemia, anterolateral lds Since last tracing T wave abnormality NOW PRESENT Confirmed by Sabra Heck  MD, Cohl Behrens ([id number]) on 04/24/2014 8:35:49 PM      CRITICAL  CARE Performed by: Noemi Chapel D Total critical care time: 35 Critical care time was exclusive of separately billable procedures and treating other patients. Critical care was necessary to treat or prevent imminent or life-threatening deterioration. Critical care was time spent personally by me on the following activities: development of treatment plan with patient and/or surrogate as well as nursing, discussions with consultants, evaluation of patient's response to treatment, examination of patient, obtaining history from patient or surrogate, ordering and performing treatments and interventions, ordering and review of laboratory studies, ordering and review of radiographic studies, pulse oximetry and re-evaluation of patient's condition.  Medical screening examination/treatment/procedure(s) were conducted as a shared visit with non-physician practitioner(s) and myself.  I personally evaluated the patient during the encounter.  Clinical Impression:   Final diagnoses:  Chest pain  Acute pulmonary embolism         Johnna Acosta, MD 04/25/14 2039

## 2014-04-24 NOTE — ED Notes (Signed)
EDP at bedside  

## 2014-04-24 NOTE — ED Notes (Signed)
Phlebotomy at bedside.

## 2014-04-24 NOTE — ED Provider Notes (Signed)
CSN: [id number]     Arrival date & time 04/24/14  1356 History   First MD Initiated Contact with Patient 04/24/14 1551     Chief Complaint  Patient presents with  . Leg Pain     (Consider location/radiation/quality/duration/timing/severity/associated sxs/prior Treatment) Patient is a 74 y.o. male presenting with leg pain. The history is provided by the patient and a relative. No language interpreter was used.  Leg Pain Mr. Gottlieb had an bovine aortic valve replacement two weeks ago, not on anticoagulants, presents with gradual onset left calf tenderness that began 2 days ago. He denies any fever, shortness of breath, leg swelling, chest pain, tachycardia. He denies any prior DVT or PE.  Past Medical History  Diagnosis Date  . Hypercholesteremia   . HTN (hypertension)   . Aortic regurgitation   . Aortic root enlargement   . Aortic regurgitation   . Heart murmur   . Arthritis     both shoulders - RC- wear   Past Surgical History  Procedure Laterality Date  . Multiple tooth extractions    . Left and right heart catheterization with coronary angiogram N/A 02/22/2014    Procedure: LEFT AND RIGHT HEART CATHETERIZATION WITH CORONARY ANGIOGRAM;  Surgeon: Sinclair Grooms, MD;  Location: [facility] CATH LAB;  Service: Cardiovascular;  Laterality: N/A;  . Cardiac catheterization    . Tee without cardioversion N/A 03/24/2014    Procedure: TRANSESOPHAGEAL ECHOCARDIOGRAM (TEE);  Surgeon: Dorothy Spark, MD;  Location: [place];  Service: Cardiovascular;  Laterality: N/A;  . Aortic valve replacement N/A 04/11/2014    Procedure: AORTIC VALVE REPLACEMENT (AVR);  Surgeon: Gaye Pollack, MD;  Location: [place];  Service: Open Heart Surgery;  Laterality: N/A;  . Tee without cardioversion N/A 04/11/2014    Procedure: TRANSESOPHAGEAL ECHOCARDIOGRAM (TEE);  Surgeon: Gaye Pollack, MD;  Location: [place];  Service: Open Heart Surgery;  Laterality: N/A;   Family History  Problem Relation Age of Onset  .  Hypertension Mother    History  Substance Use Topics  . Smoking status: Former Smoker    Types: Cigars    Quit date: 05/29/2001  . Smokeless tobacco: Not on file  . Alcohol Use: No    Review of Systems  Respiratory: Negative for chest tightness and shortness of breath.   Cardiovascular: Negative for chest pain.  All other systems reviewed and are negative.     Allergies  Review of patient's allergies indicates no known allergies.  Home Medications   Prior to Admission medications   Medication Sig Start Date End Date Taking? Authorizing Provider  acetaminophen (TYLENOL) 500 MG tablet Take 1,000 mg by mouth every 6 (six) hours as needed.   Yes Historical Provider, MD  hydrochlorothiazide (HYDRODIURIL) 25 MG tablet Take 25 mg by mouth daily.   Yes Historical Provider, MD  metoprolol tartrate (LOPRESSOR) 25 MG tablet Take 1 tablet (25 mg total) by mouth 2 (two) times daily. 04/15/14  Yes Erin Barrett, PA-C  Multiple Vitamin (MULTIVITAMIN) capsule Take 1 capsule by mouth daily.   Yes Historical Provider, MD  oxyCODONE (OXY IR/ROXICODONE) 5 MG immediate release tablet Take 1-2 tablets (5-10 mg total) by mouth every 3 (three) hours as needed for severe pain. 04/15/14  Yes Erin Barrett, PA-C  potassium chloride (K-DUR,KLOR-CON) 10 MEQ tablet Take 10 mEq by mouth daily.    Yes Historical Provider, MD  aspirin EC 325 MG EC tablet Take 1 tablet (325 mg total) by mouth daily. Patient not taking: Reported on  04/24/2014 04/15/14   Erin Barrett, PA-C   BP 118/82 mmHg  Pulse 107  Temp(Src) 99 F (37.2 C)  Resp 16  Ht 5\' 9"  (1.753 m)  Wt 180 lb (81.647 kg)  BMI 26.57 kg/m2  SpO2 100% Physical Exam  Constitutional: He is oriented to person, place, and time. He appears well-developed and well-nourished.  HENT:  Head: Normocephalic and atraumatic.  Eyes: Conjunctivae are normal. No scleral icterus.  Neck: Normal range of motion. No thyromegaly present.  Cardiovascular: Regular rhythm and  normal heart sounds.   tachycardia  Pulmonary/Chest: Effort normal and breath sounds normal. No respiratory distress.  Well healed surgical incision to the chest wall  Abdominal: Soft. Bowel sounds are normal.  Musculoskeletal:  Left calf tenderness.   Neurological: He is alert and oriented to person, place, and time.  Good pulses and cap refill bilaterally.   Skin: Skin is warm and dry. No rash noted. No erythema.  Nursing note and vitals reviewed.   ED Course  Procedures (including critical care time) Labs Review Labs Reviewed  D-DIMER, QUANTITATIVE - Abnormal; Notable for the following:    D-Dimer, Quant >20.00 (*)    All other components within normal limits  CBC WITH DIFFERENTIAL/PLATELET - Abnormal; Notable for the following:    RBC 3.95 (*)    Hemoglobin 10.7 (*)    HCT 32.8 (*)    All other components within normal limits  PROTIME-INR - Abnormal; Notable for the following:    Prothrombin Time 15.4 (*)    All other components within normal limits  APTT - Abnormal; Notable for the following:    aPTT 39 (*)    All other components within normal limits  I-STAT CHEM 8, ED - Abnormal; Notable for the following:    Glucose, Bld 140 (*)    Hemoglobin 12.2 (*)    HCT 36.0 (*)    All other components within normal limits  HEPARIN LEVEL (UNFRACTIONATED)  CBC    Imaging Review No results found.   EKG Interpretation None      MDM   Final diagnoses:  Chest pain   Labs: cbc, bmp, PTT, INR/PT, D-dimer shows elevation but this is expected after surgery. Imaging: Doppler US of lower extremities shows left occlusive DVT and right partially occlusive DVT.   19:10 Consulted vascular surgeon   Patient is tachycardic throughout his stay so I ordered a CTA which shows bilateral PE's.  Consulted hospitalist and he will admit. Started Heparin drip.    Ottie Glazier, PA-C 04/25/14 1038  Johnna Acosta, MD 04/25/14 2041

## 2014-04-24 NOTE — ED Notes (Signed)
Pt c/o left leg pain onset Thursday. Pt recently had surgery with placement of a valve and when he got home he began to have the pain in left leg. Pt denies shortness of breath.

## 2014-04-24 NOTE — Progress Notes (Signed)
ANTICOAGULATION CONSULT NOTE - Initial Consult  Pharmacy Consult: Heparin Indication:  New DVT  No Known Allergies  Patient Measurements: Height: 5\' 9"  (175.3 cm) Weight: 180 lb (81.647 kg) IBW/kg (Calculated) : 70.7 Heparin Dosing Weight:  82 kg  Vital Signs: Temp: 99 F (37.2 C) (02/28 1402) BP: 116/81 mmHg (02/28 1745) Pulse Rate: 103 (02/28 1745)  Labs:  Recent Labs  04/24/14 1448 04/24/14 1622  HGB 12.2* 10.7*  HCT 36.0* 32.8*  PLT  --  272  APTT  --  39*  LABPROT  --  15.4*  INR  --  1.21  CREATININE 1.00  --     Estimated Creatinine Clearance: 65.8 mL/min (by C-G formula based on Cr of 1).   Medical History: Past Medical History  Diagnosis Date  . Hypercholesteremia   . HTN (hypertension)   . Aortic regurgitation   . Aortic root enlargement   . Aortic regurgitation   . Heart murmur   . Arthritis     both shoulders - RC- wear     Assessment: 41 YOM presented with complaint of left leg pain and found to have a new DVT.  Pharmacy consulted to initiate IV heparin.  Baseline labs and home meds reviewed.   Goal of Therapy:  Heparin level 0.3-0.7 units/ml Monitor platelets by anticoagulation protocol: Yes    Plan:  - Heparin 4000 units IV bolus x 1, then - Heparin gtt at 1250 units/hr - Check 8 hr HL - Daily HL / CBC    Moody Robben D. Mina Marble, PharmD, BCPS Pager:  [phone] 04/24/2014, 7:11 PM

## 2014-04-25 LAB — COMPREHENSIVE METABOLIC PANEL
ALBUMIN: 3 g/dL — AB (ref 3.5–5.2)
ALT: 40 U/L (ref 0–53)
AST: 22 U/L (ref 0–37)
Alkaline Phosphatase: 89 U/L (ref 39–117)
Anion gap: 7 (ref 5–15)
BUN: 11 mg/dL (ref 6–23)
CO2: 27 mmol/L (ref 19–32)
CREATININE: 1.11 mg/dL (ref 0.50–1.35)
Calcium: 9.2 mg/dL (ref 8.4–10.5)
Chloride: 105 mmol/L (ref 96–112)
GFR calc Af Amer: 74 mL/min — ABNORMAL LOW (ref >90)
GFR calc non Af Amer: 64 mL/min — ABNORMAL LOW (ref >90)
Glucose, Bld: 107 mg/dL — ABNORMAL HIGH (ref 70–99)
Potassium: 3.5 mmol/L (ref 3.5–5.1)
Sodium: 139 mmol/L (ref 135–145)
Total Bilirubin: 0.5 mg/dL (ref 0.3–1.2)
Total Protein: 7.1 g/dL (ref 6.0–8.3)

## 2014-04-25 LAB — LIPID PANEL
CHOLESTEROL: 185 mg/dL (ref 0–200)
HDL: 33 mg/dL — ABNORMAL LOW (ref >39)
LDL Cholesterol: 134 mg/dL — ABNORMAL HIGH (ref 0–99)
Total CHOL/HDL Ratio: 5.6 RATIO
Triglycerides: 88 mg/dL (ref <150)
VLDL: 18 mg/dL (ref 0–40)

## 2014-04-25 LAB — IRON AND TIBC
Iron: 15 ug/dL — ABNORMAL LOW (ref 42–165)
Saturation Ratios: 7 % — ABNORMAL LOW (ref 20–55)
TIBC: 216 ug/dL (ref 215–435)
UIBC: 201 ug/dL (ref 125–400)

## 2014-04-25 LAB — BRAIN NATRIURETIC PEPTIDE: B Natriuretic Peptide: 87.7 pg/mL (ref 0.0–100.0)

## 2014-04-25 LAB — TROPONIN I
Troponin I: 0.09 ng/mL — ABNORMAL HIGH (ref <0.031)
Troponin I: 0.07 ng/mL — ABNORMAL HIGH (ref <0.031)

## 2014-04-25 LAB — CBC
HCT: 29.9 % — ABNORMAL LOW (ref 39.0–52.0)
Hemoglobin: 9.8 g/dL — ABNORMAL LOW (ref 13.0–17.0)
MCH: 27.1 pg (ref 26.0–34.0)
MCHC: 32.8 g/dL (ref 30.0–36.0)
MCV: 82.6 fL (ref 78.0–100.0)
Platelets: 219 10*3/uL (ref 150–400)
RBC: 3.62 MIL/uL — AB (ref 4.22–5.81)
RDW: 12.5 % (ref 11.5–15.5)
WBC: 7.6 10*3/uL (ref 4.0–10.5)

## 2014-04-25 LAB — PROTIME-INR
INR: 1.24 (ref 0.00–1.49)
Prothrombin Time: 15.7 seconds — ABNORMAL HIGH (ref 11.6–15.2)

## 2014-04-25 LAB — RETICULOCYTES
RBC.: 3.59 MIL/uL — AB (ref 4.22–5.81)
RETIC COUNT ABSOLUTE: 39.5 10*3/uL (ref 19.0–186.0)
Retic Ct Pct: 1.1 % (ref 0.4–3.1)

## 2014-04-25 LAB — HEPARIN LEVEL (UNFRACTIONATED)
Heparin Unfractionated: 0.33 IU/mL (ref 0.30–0.70)
Heparin Unfractionated: 0.41 IU/mL (ref 0.30–0.70)

## 2014-04-25 LAB — VITAMIN B12: Vitamin B-12: 1095 pg/mL — ABNORMAL HIGH (ref 211–911)

## 2014-04-25 LAB — GLUCOSE, CAPILLARY: GLUCOSE-CAPILLARY: 103 mg/dL — AB (ref 70–99)

## 2014-04-25 LAB — FERRITIN: Ferritin: 393 ng/mL — ABNORMAL HIGH (ref 22–322)

## 2014-04-25 LAB — TSH: TSH: 0.782 u[IU]/mL (ref 0.350–4.500)

## 2014-04-25 LAB — APTT: aPTT: 74 seconds — ABNORMAL HIGH (ref 24–37)

## 2014-04-25 LAB — FOLATE: Folate: >20 ng/mL

## 2014-04-25 MED ORDER — ASPIRIN EC 81 MG PO TBEC
81.0000 mg | DELAYED_RELEASE_TABLET | Freq: Every day | ORAL | Status: DC
  Administered 2014-04-25 – 2014-04-26 (×2): 81 mg via ORAL
  Filled 2014-04-25 (×2): qty 1

## 2014-04-25 NOTE — Care Management Note (Signed)
    Page 1 of 1   04/25/2014     10:45:39 AM CARE MANAGEMENT NOTE 04/25/2014  Patient:  Frederick Burnett,Frederick Burnett   Account Number:  [id number]  Date Initiated:  04/25/2014  Documentation initiated by:  Elissa Hefty  Subjective/Objective Assessment:   adm w pul embolus     Action/Plan:   lives alone, pcp dr dean mitchell   Anticipated DC Date:     Anticipated DC Plan:        [place]  CM consult      Choice offered to / List presented to:             Status of service:   Medicare Important Message given?   (If response is "NO", the following Medicare IM given date fields will be blank) Date Medicare IM given:   Medicare IM given by:   Date Additional Medicare IM given:   Additional Medicare IM given by:    Discharge Disposition:    Per UR Regulation:  Reviewed for med. necessity/level of care/duration of stay  If discussed at [place] of Stay Meetings, dates discussed:    Comments:  2/29 1044 debbie Stephane Niemann rn,bsn xarelto will cost 45.00 for 30day supply, pradaxa is covered at 45.00 per month. eliquis will cost 170.62 per month. alerted md.

## 2014-04-25 NOTE — Progress Notes (Signed)
ANTICOAGULATION CONSULT NOTE - Follow Up Consult  Pharmacy Consult for heparin Indication: DVT  Labs:  Recent Labs  04/24/14 1448 04/24/14 1622 04/24/14 2146 04/25/14 0305 04/25/14 0910  HGB 12.2* 10.7*  --  9.8*  --   HCT 36.0* 32.8*  --  29.9*  --   PLT  --  272  --  219  --   APTT  --  39*  --  74*  --   LABPROT  --  15.4*  --  15.7*  --   INR  --  1.21  --  1.24  --   HEPARINUNFRC  --   --   --  0.33  --   CREATININE 1.00  --   --  1.11  --   TROPONINI  --   --  0.08* 0.09* 0.07*     Assessment: 73yo male therapeutic on heparin with initial dosing for DVT though at very low end of goal and would prefer higher level w/ extent of DVT, possibly going to IR today.  Anticoagulation: Heparin for new DVT, d-dimer >20, INR 1.21, heparin level at goal this am, follow up to confirm and target higher end of range.  CBC stable, no bleeding noted.    Goal of Therapy:  Heparin level 0.3-0.7 units/ml   Plan:  Continue heparin ggt at 1350 units/hr, follow up 6h HL Daily heparin level and cbc  Thank you for allowing pharmacy to be a part of this patients care team.  Rowe Robert Pharm.D., BCPS, AQ-Cardiology Clinical Pharmacist 04/25/2014 12:21 PM Pager: [phone] Phone: [phone]

## 2014-04-25 NOTE — Progress Notes (Signed)
ANTICOAGULATION CONSULT NOTE - Follow Up Consult  Pharmacy Consult for heparin Indication: DVT  Labs:  Recent Labs  04/24/14 1448 04/24/14 1622 04/24/14 2146 04/25/14 0305  HGB 12.2* 10.7*  --  9.8*  HCT 36.0* 32.8*  --  29.9*  PLT  --  272  --  219  APTT  --  39*  --  74*  LABPROT  --  15.4*  --  15.7*  INR  --  1.21  --  1.24  HEPARINUNFRC  --   --   --  0.33  CREATININE 1.00  --   --   --   TROPONINI  --   --  0.08*  --      Assessment: 73yo male therapeutic on heparin with initial dosing for DVT though at very low end of goal and would prefer higher level w/ extent of DVT, possibly going to IR today.  Goal of Therapy:  Heparin level 0.3-0.7 units/ml   Plan:  Will increase heparin gtt slightly to 1350 units/hr and check level in 6hr.  Wynona Neat, PharmD, BCPS  04/25/2014,4:08 AM

## 2014-04-25 NOTE — Progress Notes (Addendum)
[place] TEAM 1 - Stepdown/ICU TEAM Progress Note  Frederick Burnett TGG:[id number] DOB: 11/30/1940 DOA: 04/24/2014 PCP: Donnie Coffin, MD  Admit HPI / Brief Narrative: 74 y.o. male with PMH of HTN, HLD, aortic valvular disease status post aortic valve replacement with bovine valve on 04/11/14 (not on anticoagulation) who presented with pain over left calf area.  Patient had aortic valve placement on 04/11/14 and had been doing fine until 4 days prior to this admit when he started having mild tenderness over left calf area. No tenderness over the right leg.   In ED, patient was found to have tachycardia, temperature 99, anemia with hemoglobin 10.7, WBC 9.0, INR 1.21, PTT 39, and an elevated d-dimer. CTA, LE dopper and 2d echo were performed:  LE dopper: acute DVT throughout the left lower extremity, beginning in the posterior tibial vein coursing through to the popliteal, femoral, and common femoral veins. There is also acute DVT noted in the right mid to distal femoral vein.  CTA: Bilateral pulmonary emboli, right larger than left involving the right upper lobe, and both lower lobes. No right heart strain. 2D echo: Mildly dilated LV with moderately impaired systolic function, LVEF 70-35%.Mild to moderate functional MR. Severe AI with upper normal aortic root size and normal size ascending aorta.   ED physician, Dr. Sabra Heck discussed with cardiovascular surgeon, Dr. Roxy Manns, who suggested to admit pt to inpatient for IV heparin treatment.   HPI/Subjective: Pt c/o ongoing pain in his L calf which he states is moderate.  He denies cp, sob, n/v, or abdom pain.   Assessment/Plan:  Acute PE w/ B LE DVTs post recent valve replacement Cont heparin gtt while we investigate options for oral tx - CM to determine cost of novel agents v/s coumadin - transition once confirmed oral agent can be obtained   S/p AoVR (pericardial tissue valve) 04/11/2014 T[facility] has been alerted to his admit - no acute cardiac  issues presently   HTN BP currently well controlled  Anemia Most likely due to recent major surgery - follow trend on anticoag - if drops further will require investigation - Hgb at time of d/c 2/17 was 10.3  Code Status: FULL Family Communication: no family present at time of exam Disposition Plan: transfer to tele bed - follow Hgb and w/u as indicated - investigate and transition to oral agent   Consultants: none  Procedures: LE dopper: acute DVT throughout the left lower extremity, beginning in the posterior tibial vein coursing through to the popliteal, femoral, and common femoral veins. There is also acute DVT noted in the right mid to distal femoral vein.  CTA: Bilateral pulmonary emboli, right larger than left involving the right upper lobe, and both lower lobes. No right hear strain. 2D echo: Mildly dilated LV with moderately impaired systolic function, LVEF 00-93%.Mild to moderate functional MR. Severe AI with upper normal aortic root size and normal size ascending aorta.   Antibiotics: none  DVT prophylaxis: IV heparin   Objective: Blood pressure 111/78, pulse 97, temperature 99.1 F (37.3 C), temperature source Oral, resp. rate 19, height 5\' 9"  (1.753 m), weight 79.1 kg (174 lb 6.1 oz), SpO2 98 %.  Intake/Output Summary (Last 24 hours) at 04/25/14 0840 Last data filed at 04/25/14 0800  Gross per 24 hour  Intake  565.5 ml  Output    400 ml  Net  165.5 ml   Exam: General: No acute respiratory distress Lungs: Clear to auscultation bilaterally without wheezes or crackles Cardiovascular: Regular rate and  rhythm without gallop or rub normal S1 and S2 Abdomen: Nontender, nondistended, soft, bowel sounds positive, no rebound, no ascites, no appreciable mass Extremities: No significant cyanosis, clubbing, or edema bilateral lower extremities  Data Reviewed: Basic Metabolic Panel:  Recent Labs Lab 04/24/14 1448 04/25/14 0305  NA 140 139  K 3.6 3.5  CL 104  105  CO2  --  27  GLUCOSE 140* 107*  BUN 13 11  CREATININE 1.00 1.11  CALCIUM  --  9.2    Liver Function Tests:  Recent Labs Lab 04/25/14 0305  AST 22  ALT 40  ALKPHOS 89  BILITOT 0.5  PROT 7.1  ALBUMIN 3.0*    Coags:  Recent Labs Lab 04/24/14 1622 04/25/14 0305  INR 1.21 1.24    Recent Labs Lab 04/24/14 1622 04/25/14 0305  APTT 39* 74*    CBC:  Recent Labs Lab 04/24/14 1448 04/24/14 1622 04/25/14 0305  WBC  --  9.0 7.6  NEUTROABS  --  6.4  --   HGB 12.2* 10.7* 9.8*  HCT 36.0* 32.8* 29.9*  MCV  --  83.0 82.6  PLT  --  272 219    Cardiac Enzymes:  Recent Labs Lab 04/24/14 2146 04/25/14 0305  TROPONINI 0.08* 0.09*   BNP (last 3 results)  Recent Labs  04/25/14 0305  BNP 87.7    Studies:  Recent x-ray studies have been reviewed in detail by the Attending Physician  Scheduled Meds:  Scheduled Meds: . aspirin EC  325 mg Oral Daily  . metoprolol tartrate  25 mg Oral BID  . multivitamin with minerals  1 tablet Oral Daily  . sodium chloride  3 mL Intravenous Q12H    Time spent on care of this patient: 35 mins   Jaeanna Mccomber T , MD   [facility] Office  [phone] Pager - Text Page per Shea Evans as per below:  On-Call/Text Page:      [email or web address]      password [email or web address]  If 7PM-7AM, please contact night-coverage [email or web address] Password [email or web address] 04/25/2014, 8:40 AM   LOS: 1 day

## 2014-04-26 DIAGNOSIS — R0781 Pleurodynia: Secondary | ICD-10-CM

## 2014-04-26 DIAGNOSIS — I82402 Acute embolism and thrombosis of unspecified deep veins of left lower extremity: Secondary | ICD-10-CM

## 2014-04-26 LAB — CBC
HCT: 30 % — ABNORMAL LOW (ref 39.0–52.0)
HEMOGLOBIN: 10 g/dL — AB (ref 13.0–17.0)
MCH: 28 pg (ref 26.0–34.0)
MCHC: 33.3 g/dL (ref 30.0–36.0)
MCV: 84 fL (ref 78.0–100.0)
Platelets: 226 10*3/uL (ref 150–400)
RBC: 3.57 MIL/uL — AB (ref 4.22–5.81)
RDW: 12.3 % (ref 11.5–15.5)
WBC: 5.8 10*3/uL (ref 4.0–10.5)

## 2014-04-26 LAB — HEPARIN LEVEL (UNFRACTIONATED): HEPARIN UNFRACTIONATED: 0.57 [IU]/mL (ref 0.30–0.70)

## 2014-04-26 LAB — HEMOGLOBIN A1C
HEMOGLOBIN A1C: 5.7 % — AB (ref 4.8–5.6)
MEAN PLASMA GLUCOSE: 117 mg/dL

## 2014-04-26 LAB — HOMOCYSTEINE: Homocysteine: 11.8 umol/L (ref 0.0–15.0)

## 2014-04-26 MED ORDER — RIVAROXABAN 20 MG PO TABS: 20.0000 mg | ORAL_TABLET | Freq: Every day | ORAL | Status: DC

## 2014-04-26 MED ORDER — RIVAROXABAN (XARELTO) EDUCATION KIT FOR DVT/PE PATIENTS
PACK | Freq: Once | Status: DC
  Filled 2014-04-26: qty 1

## 2014-04-26 MED ORDER — RIVAROXABAN (XARELTO) VTE STARTER PACK (15 & 20 MG): ORAL_TABLET | ORAL | Status: DC

## 2014-04-26 MED ORDER — RIVAROXABAN 15 MG PO TABS
15.0000 mg | ORAL_TABLET | Freq: Two times a day (BID) | ORAL | Status: DC
  Administered 2014-04-26: 15 mg via ORAL
  Filled 2014-04-26 (×3): qty 1

## 2014-04-26 MED ORDER — ASPIRIN 81 MG PO TBEC: 81.0000 mg | DELAYED_RELEASE_TABLET | Freq: Every day | ORAL | Status: DC

## 2014-04-26 MED ORDER — RIVAROXABAN (XARELTO) EDUCATION KIT FOR DVT/PE PATIENTS: 1.0000 | PACK | Freq: Once | Status: DC

## 2014-04-26 NOTE — Progress Notes (Signed)
CARE MANAGEMENT NOTE 04/26/2014  Patient:  Frederick Burnett,Frederick Burnett   Account Number:  [id number]  Date Initiated:  04/25/2014  Documentation initiated by:  Elissa Hefty  Subjective/Objective Assessment:   adm w pul embolus     Action/Plan:   lives alone, pcp dr dean mitchell   Anticipated DC Date:  04/26/2014   Anticipated DC Plan:  [place]  CM consult      Choice offered to / List presented to:             Status of service:  Completed, signed off Medicare Important Message given?  NA - LOS <3 / Initial given by admissions (If response is "NO", the following Medicare IM given date fields will be blank) Date Medicare IM given:   Medicare IM given by:   Date Additional Medicare IM given:   Additional Medicare IM given by:    Discharge Disposition:  HOME/SELF CARE  Per UR Regulation:  Reviewed for med. necessity/level of care/duration of stay  If discussed at [place] of Stay Meetings, dates discussed:    Comments:  04/26/14- [name], BSN [phone] Per MD pt to go home on Xarelto- spoke with pt explained benefits and 30 day free card given to pt.    2/29 1044 debbie dowell rn,bsn xarelto will cost 45.00 for 30day supply, pradaxa is covered at 45.00 per month. eliquis will cost 170.62 per month. alerted md.  04/25/2014 [place] [facility] at [phone]. Talked to [facility]. All three medications covered:  ELIQUIS comes in 5MG  only. For a 30 Day Supply (60 Quantity), the co-payment will be $170.62.  XARELTO Co-Payment is $31.50 for a 21 Day supply, and $45.00 for a 30 days supply.  Pradaxa Co-Payment is $45.00 for a 30 Day Supply.  The preferred retail pharmacy is [facility]. MEDICATIONS available at many retail pharmacies

## 2014-04-26 NOTE — Progress Notes (Signed)
1225 pt Xarelto administered and heparin drip stopped as ordered. Francis Gaines Danya Spearman RN.

## 2014-04-26 NOTE — Discharge Summary (Signed)
Physician Discharge Summary  Frederick Burnett WVP:[id number] DOB: [birth year] DOA: 04/24/2014  PCP: Donnie Coffin, MD  Admit date: 04/24/2014 Discharge date: 04/26/2014  Time spent: >30 minutes  Recommendations for Outpatient Follow-up:  1. Repeat CBC to follow Hgb trend 2. Please reassess BP and adjust medications as needed 3. Given low EF, patient will benefit of low dose of ACE or ARB 4. At minimum of 6 months therapy for DVT/PE is indicated at this moment   Discharge Diagnoses:  PE (pulmonary embolism) Bilateral Left Leg DVT S/P recent Aortic valve disorder Essential hypertension Chronic systolic hear failure (EF 35-40%)  Discharge Condition: stable and improved. Discharge home with instruction to follow with PCP in 10 days. Patient has been discharged on xarelto (per CM cost on this novel anticoagulant agent will be 40 dollars per month)  Diet recommendation: heart healthy diet  Filed Weights   04/24/14 1402 04/24/14 2230 04/26/14 0416  Weight: 81.647 kg (180 lb) 79.1 kg (174 lb 6.1 oz) 79.561 kg (175 lb 6.4 oz)    History of present illness:  74 y.o. male with PMH of HTN, HLD, aortic valvular disease status post aortic valve replacement with bovine valve on 04/11/14 (not on anticoagulation) who presented with pain over left calf area. Patient had aortic valve placement on 04/11/14 and had been doing fine until 4 days prior to this admit when he started having mild tenderness over left calf area. No tenderness over the right leg.   In ED, patient was found to have tachycardia, temperature 99, anemia with hemoglobin 10.7, WBC 9.0, INR 1.21, PTT 39, and an elevated d-dimer. CTA demonstrated bilateral PE  Hospital Course:  Acute PE w/ B LE DVTs post recent valve replacement -patient treated with heparin drip initially; now that he is stable will transition to xarelto and will need therapy for a minimum of 6 months. -patient w/o CP or SOB; good O2 sat on RA  Chronic and compensated  Systolic heart failure (due to valvular disease) -s/p aortic valve replacement  -continue outpatient follow up with cardiology -will benefit of low dose ACE/ARB if BP tolerates it; will let cardiology to decide -continue lopressor -advise to follow low sodium diet and to check his weight on daily basis  S/p AoVR (pericardial tissue valve) 04/11/2014 -TCTS has been alerted of his admit - no acute cardiac issues presently -Follow up as an outpatient    Essential HTN -BP currently well controlled -continue current antihypertensive regimen  Anemia (due to ABLA with recent major surgery) -No signs of overt bleeding -at discharge Hgb 10.0 -close outpatient follow up of Hgb trend  Procedures:  LE dopper: acute DVT throughout the left lower extremity, beginning in the posterior tibial vein coursing through to the popliteal, femoral, and common femoral veins. There is also acute DVT noted in the right mid to distal femoral vein.  CTA: Bilateral pulmonary emboli, right larger than left involving the right upper lobe, and both lower lobes. No right heart strain. 2D echo: Mildly dilated LV with moderately impaired systolic function, LVEF 54-62%.Mild to moderate functional MR. Severe AI with upper normal aortic root size and normal size ascending aorta.   Consultations:  Thoracic surgery service was curbside (recommended medical management of DVT/PE)  Discharge Exam: Filed Vitals:   04/26/14 1017  BP: 116/74  Pulse: 98  Temp: 98.4 F (36.9 C)  Resp: 18    General: No acute respiratory distress; no fever. Patient denies CP Lungs: Clear to auscultation bilaterally without wheezes or crackles Cardiovascular:  RRR, S1 and s2, no rubs or gallops Abdomen: Nontender, nondistended, soft, bowel sounds positive, no rebound, no ascites, no appreciable mass Extremities: No cyanosis or clubbing; trace edema bilaterally; patient endorses pain on his left leg Neurologic exam: no focal deficit    Discharge Instructions  Discharge Instructions    Diet - low sodium heart healthy    Complete by:  As directed      Discharge instructions    Complete by:  As directed   [place] PCP IN 10 DAYS (FOR DVT/PE) TAKE MEDICATIONS AS PRESCRIBED FOLLOW HEART HEALTHY DIET AVOID THE USE OF NSAID's (MOTRIN, ALEVE, ADVIL, EXCEDRIN, GOODY POWDER, IBUPROFEN, ETC...); WILL INCREASE RISK OF BLEEDING WHILE TAKING XARELTO          Current Discharge Medication List    START taking these medications   Details  Rivaroxaban (XARELTO STARTER PACK) 15 & 20 MG TBPK Take as directed on package: Start with one $Remove'15mg'jWbaZmj$  tablet by mouth twice a day with food. On Day 22, switch to one $Remo'20mg'cbRGo$  tablet once a day with food. Qty: 51 each, Refills: 0    rivaroxaban (XARELTO) 20 MG TABS tablet Take 1 tablet (20 mg total) by mouth daily with supper. TO BE TAKEN/FILLED AFTER STARTER PACK IS COMPLETED TO GUARANTEE NO MISSING DOSE PRIOR TO FURTHER REFILLS Qty: 30 tablet, Refills: 0    rivaroxaban (XARELTO) KIT 1 kit by Does not apply route once. Qty: 1 kit, Refills: 0      CONTINUE these medications which have CHANGED   Details  aspirin EC 81 MG EC tablet Take 1 tablet (81 mg total) by mouth daily.      CONTINUE these medications which have NOT CHANGED   Details  acetaminophen (TYLENOL) 500 MG tablet Take 1,000 mg by mouth every 6 (six) hours as needed.    hydrochlorothiazide (HYDRODIURIL) 25 MG tablet Take 25 mg by mouth daily.    metoprolol tartrate (LOPRESSOR) 25 MG tablet Take 1 tablet (25 mg total) by mouth 2 (two) times daily. Qty: 60 tablet, Refills: 3    Multiple Vitamin (MULTIVITAMIN) capsule Take 1 capsule by mouth daily.    oxyCODONE (OXY IR/ROXICODONE) 5 MG immediate release tablet Take 1-2 tablets (5-10 mg total) by mouth every 3 (three) hours as needed for severe pain. Qty: 30 tablet, Refills: 0    potassium chloride (K-DUR,KLOR-CON) 10 MEQ tablet Take 10 mEq by mouth daily.         No Known Allergies Follow-up Information    Follow up with [facility], MD. Schedule an appointment as soon as possible for a visit in 10 days.   Specialty:  Family Medicine   Contact information:   [address] [place] [address] [phone]       The results of significant diagnostics from this hospitalization (including imaging, microbiology, ancillary and laboratory) are listed below for reference.    Significant Diagnostic Studies: Dg Chest 2 View  04/07/2014   CLINICAL DATA:  74 year old male preoperative study for aortic insufficiency, heart surgery. Initial encounter.  EXAM: CHEST  2 VIEW  COMPARISON:  Chest CTA 01/05/2013.  FINDINGS: Chronically tortuous thoracic aorta. Stable cardiac size and mediastinal contours. Visualized tracheal air column is within normal limits. No pneumothorax, pulmonary edema, pleural effusion or confluent pulmonary opacity. No acute osseous abnormality identified.  IMPRESSION: No acute cardiopulmonary abnormality.   Electronically Signed   By: Genevie Ann M.D.   On: 04/07/2014 14:37   Ct Angio Chest Pe  W/cm &/or Wo Cm  04/24/2014   CLINICAL DATA:  Recent aortic valve replacement. Positive for pulmonary embolus per vascular lab notes.  EXAM: CT ANGIOGRAPHY CHEST WITH CONTRAST  TECHNIQUE: Multidetector CT imaging of the chest was performed using the standard protocol during bolus administration of intravenous contrast. Multiplanar CT image reconstructions and MIPs were obtained to evaluate the vascular anatomy.  CONTRAST:  63mL OMNIPAQUE IOHEXOL 350 MG/ML SOLN  COMPARISON:  01/05/2013  FINDINGS: There are bilateral pulmonary emboli, the largest on the right where the embolus extends from the the right upper lobes into the right lower lobe. Emboli also noted within the left lower lobe. There are linear areas of atelectasis dependently in the lower lobes bilaterally and in the lung bases. No effusions.  Heart is upper limits normal in  size. Aortic valve replacement. No mediastinal, hilar, or axillary adenopathy. Chest wall soft tissues are unremarkable. Imaging into the upper abdomen shows no acute findings.  No acute bony abnormality.  Review of the MIP images confirms the above findings.  IMPRESSION: Bilateral pulmonary emboli, right larger than left involving the right upper lobe, and both lower lobes. Dependent and bibasilar atelectasis.  These results were called by telephone at the time of interpretation on 04/24/2014 at 7:46 pm to the PA taking care the patient, who verbally acknowledged these results.   Electronically Signed   By: Rolm Baptise M.D.   On: 04/24/2014 19:48   Dg Chest Port 1 View  04/13/2014   CLINICAL DATA:  Aortic insufficiency, status post aortic valve replacement  EXAM: PORTABLE CHEST - 1 VIEW  COMPARISON:  April 12, 2014  FINDINGS: The chest tubes bilaterally have been removed. Cordis tip is in the superior vena cava. There is no appreciable pneumothorax. There is patchy consolidation in the left lower lobe, stable. There is mild right base atelectasis medially. There is no new opacity. Heart is mildly enlarged with pulmonary vascularity. Patient is status post aortic valve replacement.  IMPRESSION: No pneumothorax following chest tube removal bilaterally. Cordis tip in superior vena cava. Left base consolidation with right base subsegmental atelectasis. No change in cardiac silhouette.   Electronically Signed   By: Lowella Grip III M.D.   On: 04/13/2014 07:27   Dg Chest Port 1 View  04/12/2014   CLINICAL DATA:  Status post aortic valve replacement  EXAM: PORTABLE CHEST - 1 VIEW  COMPARISON:  04/11/2014  FINDINGS: Interval tracheal and esophageal extubation. The Swan-Ganz catheter has also been removed. The thoracic drains right IJ sheath are in unchanged position.  Stable mild cardiomegaly and aortic tortuosity post aortic valve replacement.  No definitive change in left more than right basilar  atelectasis, likely with small effusion. Small unexplained lucency at the right apex, possible tiny pneumothorax.  IMPRESSION: 1. Unchanged postoperative atelectasis at the left more than right bases. 2. Possible tiny right apical pneumothorax.   Electronically Signed   By: Monte Fantasia M.D.   On: 04/12/2014 07:23   Dg Chest Port 1 View  04/11/2014   CLINICAL DATA:  Postoperative from aortic valve repair.  EXAM: PORTABLE CHEST - 1 VIEW  COMPARISON:  PA and lateral chest x-ray of April 07, 2014  FINDINGS: The lungs are less well inflated today. There are chest tubes bilaterally. On the right a chest tube is present with the tip projecting over the posterior aspect of the fourth rib. On the left there are 3 chest tubes though the most medial 1 May reflect a mediastinal drain. The uppermost  chest tube tip projects over the posterior aspect of the fifth rib. The lower most chest tube projects over in the region of the posterior costophrenic gutter. There is no pneumothorax nor significant pleural effusion. The cardiac silhouette is mildly enlarged. The pulmonary vascularity is indistinct centrally with mild cephalization noted.  The endotracheal tube tip projects 3 cm above the carina. The esophagogastric tube tip projects below the inferior margin of the image. The Swan-Ganz catheter tip projects in the region of the proximal right main pulmonary artery.  IMPRESSION: Postsurgical changes as described. Low-grade pulmonary edema is present. There is no pneumothorax or pleural effusion. The support tubes and lines are in appropriate position radiographically.   Electronically Signed   By: David  Martinique   On: 04/11/2014 12:47   Labs: Basic Metabolic Panel:  Recent Labs Lab 04/24/14 1448 04/25/14 0305  NA 140 139  K 3.6 3.5  CL 104 105  CO2  --  27  GLUCOSE 140* 107*  BUN 13 11  CREATININE 1.00 1.11  CALCIUM  --  9.2   Liver Function Tests:  Recent Labs Lab 04/25/14 0305  AST 22  ALT 40   ALKPHOS 89  BILITOT 0.5  PROT 7.1  ALBUMIN 3.0*   CBC:  Recent Labs Lab 04/24/14 1448 04/24/14 1622 04/25/14 0305 04/26/14 0444  WBC  --  9.0 7.6 5.8  NEUTROABS  --  6.4  --   --   HGB 12.2* 10.7* 9.8* 10.0*  HCT 36.0* 32.8* 29.9* 30.0*  MCV  --  83.0 82.6 84.0  PLT  --  272 219 226   Cardiac Enzymes:  Recent Labs Lab 04/24/14 2146 04/25/14 0305 04/25/14 0910  TROPONINI 0.08* 0.09* 0.07*   BNP: BNP (last 3 results)  Recent Labs  04/25/14 0305  BNP 87.7   CBG:  Recent Labs Lab 04/25/14 0728  GLUCAP 103*    Signed:  Barton Dubois  [facility] 04/26/2014, 11:06 AM

## 2014-04-26 NOTE — Progress Notes (Signed)
ANTICOAGULATION CONSULT NOTE - Follow Up Consult  Pharmacy Consult for heparin to xarelto Indication: DVT  Labs:  Recent Labs  04/24/14 1448 04/24/14 1622 04/24/14 2146 04/25/14 0305 04/25/14 0910 04/25/14 1145 04/26/14 0444  HGB 12.2* 10.7*  --  9.8*  --   --  10.0*  HCT 36.0* 32.8*  --  29.9*  --   --  30.0*  PLT  --  272  --  219  --   --  226  APTT  --  39*  --  74*  --   --   --   LABPROT  --  15.4*  --  15.7*  --   --   --   INR  --  1.21  --  1.24  --   --   --   HEPARINUNFRC  --   --   --  0.33  --  0.41 0.57  CREATININE 1.00  --   --  1.11  --   --   --   TROPONINI  --   --  0.08* 0.09* 0.07*  --   --      Assessment: 73yo male therapeutic on heparin with initial dosing for DVT though at very low end of goal and would prefer higher level w/ extent of DVT, possibly going to IR today.  Anticoagulation: Heparin for new DVT, d-dimer >20, INR 1.21, heparin level at goal this am, follow up to confirm and target higher end of range.  CBC stable, no bleeding noted.    HL this am remains therapeutic at 0.57 on heparin 1350 units/hr.  No bleeding is noted. Pharmacy is consulted to transition heparin to rivaroxaban for pulmonary embolism. Hgb 10, Plt 226, sCr 1.1 from 2/29.  Goal of Therapy:  Prevention of future clot   Plan:  Stop heparin Start Xarelto 15mg  PO BID for 21 days followed by 20mg  daily with supper Educate patient on Xarelto Monitor s/sx of bleeding  Andrey Cota. Diona Foley, PharmD Clinical Pharmacist Pager [phone]  04/26/2014 10:54 AM

## 2014-04-26 NOTE — Progress Notes (Signed)
Pt A&O x4; pt discharge education and instructions completed with pt and family at bedside. All voices understanding and denies any questions. Pt IV and telemetry removed; pt discharge home with family to transport him home. Pt handed his prescriptions for Xarelto and aspirin. Pt also handed his xarelto kit provided by pharmacy. Pt transported off unit via wheelchair with belongings and family at side. Francis Gaines Tawonna Esquer RN.

## 2014-04-27 LAB — LUPUS ANTICOAGULANT PANEL
DRVVT: 47.9 s (ref 0.0–55.1)
PTT Lupus Anticoagulant: 55.9 s — ABNORMAL HIGH (ref 0.0–50.0)

## 2014-04-27 LAB — CARDIOLIPIN ANTIBODIES, IGG, IGM, IGA
Anticardiolipin IgA: <9 APL U/mL (ref 0–11)
Anticardiolipin IgG: <9 GPL U/mL (ref 0–14)
Anticardiolipin IgM: <9 MPL U/mL (ref 0–12)

## 2014-04-27 LAB — PTT-LA MIX: PTT-LA Mix: 49.7 s (ref 0.0–50.0)

## 2014-04-27 LAB — PTT-LA INCUB MIX: PTT-LA INCUB MIX: 40.8 s (ref 0.0–50.0)

## 2014-04-27 LAB — PROTEIN C ACTIVITY: Protein C Activity: 157 % — ABNORMAL HIGH (ref 74–151)

## 2014-04-27 LAB — PROTEIN S ACTIVITY: Protein S Activity: 84 % (ref 60–145)

## 2014-04-27 LAB — PROTEIN C, TOTAL: Protein C, Total: 97 % (ref 70–140)

## 2014-04-27 LAB — PROTEIN S, TOTAL: Protein S Ag, Total: >150 % — ABNORMAL HIGH (ref 58–150)

## 2014-04-28 LAB — PROTHROMBIN GENE MUTATION

## 2014-04-28 LAB — BETA-2-GLYCOPROTEIN I ABS, IGG/M/A
Beta-2 Glyco I IgG: <9 GPI IgG units (ref 0–20)
Beta-2-Glycoprotein I IgA: <9 GPI IgA units (ref 0–25)

## 2014-04-28 LAB — FACTOR 5 LEIDEN

## 2014-05-03 ENCOUNTER — Encounter: Payer: Medicare Other | Admitting: Physician Assistant

## 2014-05-06 ENCOUNTER — Other Ambulatory Visit: Payer: Self-pay | Admitting: Surgery

## 2014-05-06 DIAGNOSIS — Z952 Presence of prosthetic heart valve: Secondary | ICD-10-CM

## 2014-05-11 ENCOUNTER — Ambulatory Visit (INDEPENDENT_AMBULATORY_CARE_PROVIDER_SITE_OTHER): Payer: Self-pay | Admitting: Surgery

## 2014-05-11 ENCOUNTER — Encounter: Payer: Self-pay | Admitting: Surgery

## 2014-05-11 ENCOUNTER — Ambulatory Visit
Admission: RE | Admit: 2014-05-11 | Discharge: 2014-05-11 | Disposition: A | Discharge status: Home / Self Care | Payer: Medicare Other | Source: Ambulatory Visit | Attending: Surgery | Admitting: Surgery

## 2014-05-11 VITALS — BP 114/83 | HR 85 | Resp 20 | Ht 69.0 in | Wt 175.0 lb

## 2014-05-11 DIAGNOSIS — I351 Nonrheumatic aortic (valve) insufficiency: Secondary | ICD-10-CM

## 2014-05-11 DIAGNOSIS — Z952 Presence of prosthetic heart valve: Secondary | ICD-10-CM

## 2014-05-11 DIAGNOSIS — Z954 Presence of other heart-valve replacement: Secondary | ICD-10-CM

## 2014-05-11 NOTE — Progress Notes (Signed)
      HPI: Patient returns for routine postoperative follow-up having undergone AVR with a 23 mm pericardial valve on 04/11/2014. The patient's early postoperative recovery while in the hospital was notable for an uncomplicated postop course. Since hospital discharge the patient reports that he has been feeling well. 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   Current Outpatient Prescriptions  Medication Sig Dispense Refill  . acetaminophen (TYLENOL) 500 MG tablet Take 1,000 mg by mouth every 6 (six) hours as needed.    Marland Kitchen aspirin EC 81 MG EC tablet Take 1 tablet (81 mg total) by mouth daily.    . hydrochlorothiazide (HYDRODIURIL) 25 MG tablet Take 25 mg by mouth daily.    . metoprolol tartrate (LOPRESSOR) 25 MG tablet Take 1 tablet (25 mg total) by mouth 2 (two) times daily. 60 tablet 3  . Multiple Vitamin (MULTIVITAMIN) capsule Take 1 capsule by mouth daily.    Marland Kitchen oxyCODONE (OXY IR/ROXICODONE) 5 MG immediate release tablet Take 1-2 tablets (5-10 mg total) by mouth every 3 (three) hours as needed for severe pain. 30 tablet 0  . potassium chloride (K-DUR,KLOR-CON) 10 MEQ tablet Take 10 mEq by mouth daily.     Derrill Memo ON 05/17/2014] rivaroxaban (XARELTO) 20 MG TABS tablet Take 1 tablet (20 mg total) by mouth daily with supper. TO BE TAKEN/FILLED AFTER STARTER PACK IS COMPLETED TO [place] NO MISSING DOSE PRIOR TO FURTHER REFILLS 30 tablet 0   No current facility-administered medications for this visit.    Physical Exam: BP 114/83 mmHg  Pulse 85  Resp 20  Ht 5\' 9"  (1.753 m)  Wt 175 lb (79.379 kg)  BMI 25.83 kg/m2  SpO2 98% He looks well. Lung exam is clear. Cardiac exam shows a regular rate and rhythm with normal heart sounds. Chest incision is healing well and sternum is stable. There is no peripheral edema.   Diagnostic Tests:  CLINICAL DATA: Status post aortic valve replacement on April 11, 2014, pulmonary embolism on April 24, 2014. No  current symptoms.  EXAM: CHEST 2 VIEW  COMPARISON: Chest CT scan of April 24, 2014 and portable chest x-ray of April 13, 2014.  FINDINGS: The lungs are well-expanded. There is no focal infiltrate. There is no pleural effusion. The cardiac silhouette and pulmonary vascularity are normal. A prosthetic aortic valve ring is present. There are 7 intact sternal wires. There is mild stable tortuosity of the descending thoracic aorta. The bony thorax exhibits no acute abnormality. There degenerative changes of both shoulders.  IMPRESSION: There is no evidence of CHF nor other acute cardiopulmonary abnormality.   Electronically Signed  By: David Martinique  On: 05/11/2014 12:21   Impression:  Overall I think he is doing well. I encouraged him to continue walking. I told him he could drive his car but should not lift anything heavier than 10 lbs for three months postop. 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   Plan:  He has an appt with cardiology in a few weeks and will return to see me if he develops any problems with his incisions.   Gaye Pollack, MD [facility] [phone]

## 2014-05-19 DIAGNOSIS — I428 Other cardiomyopathies: Secondary | ICD-10-CM | POA: Insufficient documentation

## 2014-05-19 NOTE — Progress Notes (Signed)
Cardiology Office Note   Date:  05/20/2014   ID:  Frederick Burnett 1941-02-06, MRN [id number]  PCP:  Donnie Coffin, MD  Cardiologist:  Dr. Daneen Schick    Chief Complaint  Patient presents with  . Aortic insufficiency status post AVR  . Hospitalization Follow-up     History of Present Illness: Frederick Burnett is a 74 y.o. male with a hx of HTN, HL, aortic insufficiency. Echocardiogram in January demonstrated progression of aortic disease in reduce EF of 35-40%. He underwent cardiac catheterization which demonstrated mild nonobstructive CAD. Transesophageal echocardiogram confirmed severe aortic regurgitation and reduced EF of 35-40%. He was referred to Dr. Cyndia Bent for aortic valve replacement.  He was admitted 2/15-2/19 and underwent bioprosthetic aortic valve replacement. Postoperative course was uneventful and he remained in NSR.    Readmitted 2/28-3/1 with left leg DVT and bilateral pulmonary emboli.  He was placed on Xarelto.  He returns for follow-up.  He is overall doing well.  Chest soreness is improved.  He denies significant DOE.  He is walking daily.  He is NYHA 2-2b.  He is not interested in cardiac rehab.  He denies orthopnea, PND, edema.  He denies syncope.  He denies fever.  He has a NP cough.    Studies/Reports Reviewed Today:  Carotid US 04/07/14 1-39 percent stenosis involving the right internal carotid artery and the left internal carotid artery.  Transesophageal echocardiogram 03/24/14 - Mild concentric hypertrophy. EF 35-40%. Diffuse hypokinesis. - Aortic valve: Aortic valve leaflets are mildly thickened and non-coapting. There is severe aortic regurgitation and no stenosis. Vena contracta is > 10 mm. Aortic insufficiency jet fill almost the entire LVOT. - Ascending aorta: The ascending aorta was normal in size measuring 37 mm. This might be underestimated on TEE. For accurate measurement consider CT. - Descending aorta: The descending aorta is mildly dilated  measuring 29 mm. - Mitral valve: Mitral valve leaflets are structurally normal. The leaflets are thethered by LV dilatation resulting in a central jet with mild to moderate mitral regurgitation. - Left atrium: The atrium was normal in size. - Right ventricle: Systolic function was normal. - Right atrium: No evidence of thrombus in the atrial cavity or appendage. - Atrial septum: No defect or patent foramen ovale was identified. Echo contrast study showed no right-to-left atrial level shunt, following an increase in RA pressure induced by provocative maneuvers. - Tricuspid valve: There was trivial regurgitation. - Pulmonic valve: No evidence of vegetation.  Impressions: Mildly dilated LV with moderately impaired systolic function, LVEF 26-83%. Mild to moderate functional MR.   Severe AI with upper normal aortic root size and normal size ascending aorta. This might be underestimated on TEE study, for more accurate double oblique measurement consider a CT.   Cardiac cath 02/22/14 LM:  widely patent. LAD: widely patent but reveals proximal and mid luminal irregularities. LCx:  widely patent and contains 50% mid vessel stenosis after the first obtuse marginal branch.  RCA:  Patent with proximal and mid irregularities.   Severe aortic regurgitation with aortic root enlargement EF of 35-40%. Moderate mitral regurgitation is noted.   Past Medical History  Diagnosis Date  . Hypercholesteremia   . HTN (hypertension)   . Aortic regurgitation   . Aortic root enlargement   . Aortic regurgitation   . Heart murmur   . Arthritis     both shoulders - RC- wear    Past Surgical History  Procedure Laterality Date  . Multiple tooth extractions    .  Left and right heart catheterization with coronary angiogram N/A 02/22/2014    Procedure: LEFT AND RIGHT HEART CATHETERIZATION WITH CORONARY ANGIOGRAM;  Surgeon: Sinclair Grooms, MD;  Location: [facility] CATH LAB;  Service: Cardiovascular;  Laterality: N/A;    . Cardiac catheterization    . Tee without cardioversion N/A 03/24/2014    Procedure: TRANSESOPHAGEAL ECHOCARDIOGRAM (TEE);  Surgeon: Dorothy Spark, MD;  Location: [place];  Service: Cardiovascular;  Laterality: N/A;  . Aortic valve replacement N/A 04/11/2014    Procedure: AORTIC VALVE REPLACEMENT (AVR);  Surgeon: Gaye Pollack, MD;  Location: [place];  Service: Open Heart Surgery;  Laterality: N/A;  . Tee without cardioversion N/A 04/11/2014    Procedure: TRANSESOPHAGEAL ECHOCARDIOGRAM (TEE);  Surgeon: Gaye Pollack, MD;  Location: [place];  Service: Open Heart Surgery;  Laterality: N/A;     Current Outpatient Prescriptions  Medication Sig Dispense Refill  . acetaminophen (TYLENOL) 500 MG tablet Take 1,000 mg by mouth every 6 (six) hours as needed.    Marland Kitchen aspirin EC 81 MG EC tablet Take 1 tablet (81 mg total) by mouth daily.    . hydrochlorothiazide (HYDRODIURIL) 25 MG tablet Take 25 mg by mouth daily.    . metoprolol tartrate (LOPRESSOR) 25 MG tablet Take 1 tablet (25 mg total) by mouth 2 (two) times daily. 60 tablet 3  . Multiple Vitamin (MULTIVITAMIN) capsule Take 1 capsule by mouth daily.    Marland Kitchen oxyCODONE (OXY IR/ROXICODONE) 5 MG immediate release tablet Take 1-2 tablets (5-10 mg total) by mouth every 3 (three) hours as needed for severe pain. 30 tablet 0  . potassium chloride (K-DUR,KLOR-CON) 10 MEQ tablet Take 10 mEq by mouth daily.     . rivaroxaban (XARELTO) 20 MG TABS tablet Take 1 tablet (20 mg total) by mouth daily with supper. TO BE TAKEN/FILLED AFTER STARTER PACK IS COMPLETED TO [place] NO MISSING DOSE PRIOR TO FURTHER REFILLS 30 tablet 0   No current facility-administered medications for this visit.    Allergies:   Review of patient's allergies indicates no known allergies.    Social History:  The patient  reports that he quit smoking about 12 years ago. His smoking use included Cigars. He does not have any smokeless tobacco history on file. He reports that he does not  drink alcohol or use illicit drugs.   Family History:  The patient's family history includes Heart attack in his father; Hypertension in his brother, daughter, mother, and sister. There is no history of Stroke.    ROS:   Please see the history of present illness.   Review of Systems  Respiratory: Positive for cough.   Musculoskeletal: Positive for joint pain.  All other systems reviewed and are negative.    PHYSICAL EXAM: VS:  BP 100/70 mmHg  Pulse 96  Ht 5\' 9"  (1.753 m)  Wt 184 lb (83.462 kg)  BMI 27.16 kg/m2    Wt Readings from Last 3 Encounters:  05/20/14 184 lb (83.462 kg)  05/11/14 175 lb (79.379 kg)  04/26/14 175 lb 6.4 oz (79.561 kg)     GEN: Well nourished, well developed, in no acute distress HEENT: normal Neck: no JVD, no masses Cardiac:  Normal S1/S2, RRR; no murmur ,  no rubs or gallops, no edema  Respiratory:  clear to auscultation bilaterally, no wheezing, rhonchi or rales. GI: soft, nontender, nondistended, + BS MS: no deformity or atrophy Skin: warm and dry  Neuro:  CNs II-XII intact, Strength and sensation are intact Psych:  Normal affect   EKG:  EKG is ordered today.  It demonstrates:   NSR with intermittent accelerated junctional rhythm, HR 96, question retrograde P waves, inferolateral T-wave inversions, LVH   Recent Labs: 04/12/2014: Magnesium 2.2 04/25/2014: ALT 40; B Natriuretic Peptide 87.7; BUN 11; Creatinine 1.11; Potassium 3.5; Sodium 139; TSH 0.782 04/26/2014: Hemoglobin 10.0*; Platelets 226    Lipid Panel    Component Value Date/Time   CHOL 185 04/25/2014 0305   TRIG 88 04/25/2014 0305   HDL 33* 04/25/2014 0305   CHOLHDL 5.6 04/25/2014 0305   VLDL 18 04/25/2014 0305   LDLCALC 134* 04/25/2014 0305      ASSESSMENT AND PLAN:  Aortic regurgitation S/P AVR He is progressing very well.  He is not limited by significant DOE after his presentation with a DVT/pulmonary emboli.  He is not interested in Cardiac Rehab.  Continue ASA.     -   Arrange FU echo.    -  SBE card given.  PE (pulmonary embolism) This would be a provoked PE. I suspect he will likely be able to come off of Xarelto after about 6 mos.    NICM (nonischemic cardiomyopathy) No signs of volume excess.  Arrange FU echo as noted.  If EF remains low, consider DC HCTZ and starting a low dose of ACE inhibitor.  BP cannot currently tolerate the addition of ACE inhibitor.   Essential hypertension Controlled on beta-blocker and diuretic.   HLD (hyperlipidemia) With non-obstructive CAD, will start Lipitor 20 mg QD and Check Lipids and LFTs in 6 weeks.    Coronary artery disease   No angina.  Continue ASA, statin, beta-blocker.  Accelerated Junctional Rhythm I reviewed his ECG today with Dr. Virl Axe (EP).  His rhythm appears to be NSR with intermittent junctional rhythm. It is likely related to his AVR. This seems to start with a premature junctional beat.  The junctional rhythm seems to be discordant with his underlying sinus rhythm.  After further review, we felt that he needed no further intervention at this time.  Continue current Rx.  I discussed with the patient.  If he has symptoms from this (palpitations, dizziness, etc), we can consider placing him on an event monitor.     Current medicines are reviewed at length with the patient today.  The patient does not have concerns regarding medicines.  The following changes have been made:    Start Lipitor 20 mg Once daily    Labs/ tests ordered today include:  Orders Placed This Encounter  Procedures  . Lipid Profile  . Hepatic function panel  . EKG 12-Lead  . 2D Echocardiogram without contrast    Disposition:   FU with Dr. Daneen Schick 6 weeks.    Signed, Versie Starks, MHS 05/20/2014 10:19 AM    [facility] [address], [place], [place]  [address] Phone: [phone]; Fax: [phone]

## 2014-05-20 ENCOUNTER — Ambulatory Visit (INDEPENDENT_AMBULATORY_CARE_PROVIDER_SITE_OTHER): Payer: Medicare Other | Admitting: Physician Assistant

## 2014-05-20 ENCOUNTER — Encounter: Payer: Self-pay | Admitting: Physician Assistant

## 2014-05-20 VITALS — BP 100/70 | HR 96 | Ht 69.0 in | Wt 184.0 lb

## 2014-05-20 DIAGNOSIS — Z952 Presence of prosthetic heart valve: Secondary | ICD-10-CM

## 2014-05-20 DIAGNOSIS — I429 Cardiomyopathy, unspecified: Secondary | ICD-10-CM | POA: Diagnosis not present

## 2014-05-20 DIAGNOSIS — I10 Essential (primary) hypertension: Secondary | ICD-10-CM

## 2014-05-20 DIAGNOSIS — E785 Hyperlipidemia, unspecified: Secondary | ICD-10-CM

## 2014-05-20 DIAGNOSIS — I2699 Other pulmonary embolism without acute cor pulmonale: Secondary | ICD-10-CM | POA: Diagnosis not present

## 2014-05-20 DIAGNOSIS — I428 Other cardiomyopathies: Secondary | ICD-10-CM

## 2014-05-20 DIAGNOSIS — I351 Nonrheumatic aortic (valve) insufficiency: Secondary | ICD-10-CM

## 2014-05-20 DIAGNOSIS — Z954 Presence of other heart-valve replacement: Secondary | ICD-10-CM | POA: Diagnosis not present

## 2014-05-20 DIAGNOSIS — I2510 Atherosclerotic heart disease of native coronary artery without angina pectoris: Secondary | ICD-10-CM

## 2014-05-20 MED ORDER — ATORVASTATIN CALCIUM 20 MG PO TABS: 20.0000 mg | ORAL_TABLET | Freq: Every day | ORAL | Status: DC

## 2014-05-20 NOTE — Patient Instructions (Addendum)
YOU HAVE BEEN GIVEN AN SBE CARD; PLEASE BE SURE TO CARRY THIS AT ALL TIMES AND MAKE SURE TO HAVE ALL OF YOUR DOCTORS MAKE A COPY OF THIS CARD  START LIPITOR 20 MG 1 TABLET DAILY; NEW RX SENT IN TODAY  YOU WILL NEED FASTING LAB WORK (LIPID AND LIVER PANEL) TO BE DONE IN 6 WEEKS  Your physician has requested that you have an echocardiogram WITH CONTRAST. Echocardiography is a painless test that uses sound waves to create images of your heart. It provides your doctor with information about the size and shape of your heart and how well your heart's chambers and valves are working. This procedure takes approximately one hour. There are no restrictions for this procedure.  Your physician recommends that you schedule a follow-up appointment in: [place]

## 2014-05-26 ENCOUNTER — Other Ambulatory Visit (HOSPITAL_COMMUNITY): Payer: Medicare Other

## 2014-05-26 ENCOUNTER — Ambulatory Visit (HOSPITAL_COMMUNITY): Payer: Medicare Other | Attending: Physician Assistant | Admitting: Radiology

## 2014-05-26 DIAGNOSIS — E785 Hyperlipidemia, unspecified: Secondary | ICD-10-CM | POA: Insufficient documentation

## 2014-05-26 DIAGNOSIS — I10 Essential (primary) hypertension: Secondary | ICD-10-CM | POA: Insufficient documentation

## 2014-05-26 DIAGNOSIS — Z952 Presence of prosthetic heart valve: Secondary | ICD-10-CM

## 2014-05-26 DIAGNOSIS — I351 Nonrheumatic aortic (valve) insufficiency: Secondary | ICD-10-CM | POA: Diagnosis present

## 2014-05-26 DIAGNOSIS — Z954 Presence of other heart-valve replacement: Secondary | ICD-10-CM | POA: Diagnosis not present

## 2014-05-26 DIAGNOSIS — I359 Nonrheumatic aortic valve disorder, unspecified: Secondary | ICD-10-CM

## 2014-05-26 NOTE — Progress Notes (Signed)
Echocardiogram performed.  

## 2014-05-27 ENCOUNTER — Telehealth: Payer: Self-pay

## 2014-05-27 NOTE — Telephone Encounter (Signed)
Called pt as requested.lmtcb if assistance is still needed

## 2014-05-27 NOTE — Telephone Encounter (Signed)
-----   Message from Lenard Galloway V sent at 05/26/2014  4:38 PM EDT ----- Please call patient.  He wants to go back to work.  He was upset his follow up appt was not until 07/01/14.  [facility] notes says patient is to follow up in 6 weeks.  This appointment is 6 weeks out.  Says he was told he would see doctor after echo.  His number [phone]

## 2014-05-27 NOTE — Telephone Encounter (Signed)
Follow up      Returning Frederick Burnett's call.  Pt wishes to be called back before you leave today

## 2014-05-27 NOTE — Telephone Encounter (Signed)
Returned pt call. Pt sts that he is doing well s/p AVR. He is anxious to return to work. 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 Adv him Dr.Smioth will return to the office on 4/6. I will discuss it with him and call back on 4/6. Pt verbalized understanding

## 2014-05-29 ENCOUNTER — Encounter: Payer: Self-pay | Admitting: Physician Assistant

## 2014-05-30 ENCOUNTER — Telehealth: Payer: Self-pay | Admitting: Interventional Cardiology

## 2014-05-30 ENCOUNTER — Telehealth: Payer: Self-pay | Admitting: *Deleted

## 2014-05-30 DIAGNOSIS — I428 Other cardiomyopathies: Secondary | ICD-10-CM

## 2014-05-30 MED ORDER — LISINOPRIL 5 MG PO TABS: 5.0000 mg | ORAL_TABLET | Freq: Every day | ORAL | Status: DC

## 2014-05-30 NOTE — Telephone Encounter (Signed)
I called pt after d/w Brynda Rim. PA about pt asking for work note to rtn to work tomorrow 05/31/14. Per Brynda Rim. PA pt was advised that pt needs to discuss this w/surgeon Dr. Cyndia Bent [phone]. Pt verbalized understanding and said he will call the surgeon.

## 2014-05-30 NOTE — Telephone Encounter (Signed)
Follow Up   Pt called back to discuss medications

## 2014-05-30 NOTE — Telephone Encounter (Signed)
Follow up      Pt talked to Arbie Cookey this am.  He is to stop taking HCTZ.  He does not have a presc for this medication.  Is it under another name?

## 2014-05-30 NOTE — Telephone Encounter (Signed)
ptcb and has been notified of lab results with med changes and bmet 4/12. Pt read back x 2 new instructions with verbal understanding.

## 2014-05-30 NOTE — Telephone Encounter (Signed)
New message      Need note to return to work tomorrow.  Pt had a procedure on 04-11-14 and he will return to work tomorrow.  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  Please call and he will pick up the note today.

## 2014-05-30 NOTE — Telephone Encounter (Signed)
ptcb with his daughter on the phone this time to go over the med changes again. I stated to the daughter that Brynda Rim. PA was d/c HCTZ, K+ and starting lisinopril 5 mg daily w/bmet 4/12. Daughter verbalized understanding to these instructions.

## 2014-05-30 NOTE — Telephone Encounter (Signed)
lmptcb to go over echo results and med changes

## 2014-06-07 ENCOUNTER — Other Ambulatory Visit (INDEPENDENT_AMBULATORY_CARE_PROVIDER_SITE_OTHER): Payer: Medicare Other

## 2014-06-07 DIAGNOSIS — I429 Cardiomyopathy, unspecified: Secondary | ICD-10-CM | POA: Diagnosis not present

## 2014-06-07 DIAGNOSIS — E785 Hyperlipidemia, unspecified: Secondary | ICD-10-CM

## 2014-06-07 LAB — BASIC METABOLIC PANEL
BUN: 9 mg/dL (ref 6–23)
CALCIUM: 9 mg/dL (ref 8.4–10.5)
CO2: 26 mEq/L (ref 19–32)
CREATININE: 1.02 mg/dL (ref 0.40–1.50)
Chloride: 110 mEq/L (ref 96–112)
GFR: 91.81 mL/min (ref >60.00)
Glucose, Bld: 90 mg/dL (ref 70–99)
Potassium: 3.8 mEq/L (ref 3.5–5.1)
Sodium: 140 mEq/L (ref 135–145)

## 2014-06-07 LAB — LIPID PANEL
CHOL/HDL RATIO: 3
Cholesterol: 127 mg/dL (ref 0–200)
HDL: 49.6 mg/dL (ref >39.00)
LDL CALC: 61 mg/dL (ref 0–99)
NonHDL: 77.4
TRIGLYCERIDES: 81 mg/dL (ref 0.0–149.0)
VLDL: 16.2 mg/dL (ref 0.0–40.0)

## 2014-06-07 LAB — HEPATIC FUNCTION PANEL
ALT: 18 U/L (ref 0–53)
AST: 18 U/L (ref 0–37)
Albumin: 3.6 g/dL (ref 3.5–5.2)
Alkaline Phosphatase: 96 U/L (ref 39–117)
Bilirubin, Direct: 0 mg/dL (ref 0.0–0.3)
TOTAL PROTEIN: 7.1 g/dL (ref 6.0–8.3)
Total Bilirubin: 0.3 mg/dL (ref 0.2–1.2)

## 2014-06-08 ENCOUNTER — Telehealth: Payer: Self-pay | Admitting: *Deleted

## 2014-06-08 NOTE — Telephone Encounter (Signed)
pt notified about lab results with verbal understanding. Pt aksed when can he stop all of his medications. I advised do not stop medications unless advised by the Dr. I explained to pt that lab work shows his medications are working, pt said ok.

## 2014-06-17 ENCOUNTER — Other Ambulatory Visit: Payer: Self-pay

## 2014-06-17 ENCOUNTER — Telehealth: Payer: Self-pay | Admitting: Physician Assistant

## 2014-06-17 MED ORDER — RIVAROXABAN 20 MG PO TABS: 20.0000 mg | ORAL_TABLET | Freq: Every day | ORAL | Status: DC

## 2014-06-17 NOTE — Telephone Encounter (Signed)
    Patient called the after hours answering service wanting to know why Xarelto was so expensive ( ~40$). I told him this was actually a very good price for the Rx as it is usually more than this. He did not like this answer and so he will call the office Monday to see if he has any other options.    Angelena Form PA-C  MHS

## 2014-07-01 ENCOUNTER — Ambulatory Visit: Payer: Medicare Other | Admitting: Interventional Cardiology

## 2014-08-15 ENCOUNTER — Other Ambulatory Visit: Payer: Self-pay | Admitting: Physician Assistant

## 2014-09-25 NOTE — Progress Notes (Signed)
Cardiology Office Note   Date:  09/27/2014   ID:  Cullin, Dishman 1940/10/05, MRN [id number]  PCP:  Donnie Coffin, MD  Cardiologist:  Sinclair Grooms, MD   Chief Complaint  Patient presents with  . Cardiac Valve Problem    aortic valve      History of Present Illness: Frederick Burnett is a 73 y.o. male who presents for bicuspid aortic valve with severe aortic regurgitation treated with bioprosthetic valve, essential hypertension.  He is doing well since recent surgery. He is back working as a Recruitment consultant. He denies orthopnea and PND.    Past Medical History  Diagnosis Date  . Hypercholesteremia   . HTN (hypertension)   . Aortic regurgitation   . Aortic root enlargement   . Aortic regurgitation   . Heart murmur   . Arthritis     both shoulders - RC- wear  . Hx of echocardiogram     Echo 3/16: mild LVH, EF 40-45%, ant-septal AK, AVR with elevated velocity (3.3 m/s - slightly above expected value), mild LAE    Past Surgical History  Procedure Laterality Date  . Multiple tooth extractions    . Left and right heart catheterization with coronary angiogram N/A 02/22/2014    Procedure: LEFT AND RIGHT HEART CATHETERIZATION WITH CORONARY ANGIOGRAM;  Surgeon: Sinclair Grooms, MD;  Location: [facility] CATH LAB;  Service: Cardiovascular;  Laterality: N/A;  . Cardiac catheterization    . Tee without cardioversion N/A 03/24/2014    Procedure: TRANSESOPHAGEAL ECHOCARDIOGRAM (TEE);  Surgeon: Dorothy Spark, MD;  Location: [place];  Service: Cardiovascular;  Laterality: N/A;  . Aortic valve replacement N/A 04/11/2014    Procedure: AORTIC VALVE REPLACEMENT (AVR);  Surgeon: Gaye Pollack, MD;  Location: [place];  Service: Open Heart Surgery;  Laterality: N/A;  . Tee without cardioversion N/A 04/11/2014    Procedure: TRANSESOPHAGEAL ECHOCARDIOGRAM (TEE);  Surgeon: Gaye Pollack, MD;  Location: [place];  Service: Open Heart Surgery;  Laterality: N/A;     Current Outpatient  Prescriptions  Medication Sig Dispense Refill  . acetaminophen (TYLENOL) 500 MG tablet Take 1,000 mg by mouth every 6 (six) hours as needed for mild pain, fever or headache.     Marland Kitchen aspirin EC 81 MG EC tablet Take 1 tablet (81 mg total) by mouth daily.    Marland Kitchen atorvastatin (LIPITOR) 20 MG tablet Take 1 tablet (20 mg total) by mouth daily. 30 tablet 11  . lisinopril (PRINIVIL,ZESTRIL) 5 MG tablet Take 1 tablet (5 mg total) by mouth daily. 30 tablet 11  . metoprolol tartrate (LOPRESSOR) 25 MG tablet Take 1 tablet (25 mg total) by mouth 2 (two) times daily. 60 tablet 3  . Multiple Vitamin (MULTIVITAMIN) capsule Take 1 capsule by mouth daily.    . rivaroxaban (XARELTO) 20 MG TABS tablet Take 1 tablet (20 mg total) by mouth daily with supper. 30 tablet 6   No current facility-administered medications for this visit.    Allergies:   Review of patient's allergies indicates no known allergies.    Social History:  The patient  reports that he quit smoking about 13 years ago. His smoking use included Cigars. He has never used smokeless tobacco. He reports that he does not drink alcohol or use illicit drugs.   Family History:  The patient's family history includes Heart attack in his father; Hypertension in his brother, daughter, mother, and sister. There is no history of Stroke.    ROS:  Please see the history of present illness.   Otherwise, review of systems are positive for some dizziness with sudden movements. Occasional muscle aches. Otherwise no complaints. Appetite is been stable..   All other systems are reviewed and negative.    PHYSICAL EXAM: VS:  BP 148/98 mmHg  Pulse 85  Ht 5\' 9"  (1.753 m)  Wt 87.998 kg (194 lb)  BMI 28.64 kg/m2  SpO2 98% , BMI Body mass index is 28.64 kg/(m^2). GEN: Well nourished, well developed, in no acute distress HEENT: normal Neck: no JVD, carotid bruits, or masses Cardiac: RRR; no murmurs, rubs, or gallops,no edema  Respiratory:  clear to auscultation  bilaterally, normal work of breathing GI: soft, nontender, nondistended, + BS MS: no deformity or atrophy Skin: warm and dry, no rash Neuro:  Strength and sensation are intact Psych: euthymic mood, full affect   EKG:  EKG is not ordered today.   Recent Labs: 04/12/2014: Magnesium 2.2 04/25/2014: B Natriuretic Peptide 87.7; TSH 0.782 04/26/2014: Hemoglobin 10.0*; Platelets 226 06/07/2014: ALT 18; BUN 9; Creatinine, Ser 1.02; Potassium 3.8; Sodium 140    Lipid Panel    Component Value Date/Time   CHOL 127 06/07/2014 1018   TRIG 81.0 06/07/2014 1018   HDL 49.60 06/07/2014 1018   CHOLHDL 3 06/07/2014 1018   VLDL 16.2 06/07/2014 1018   LDLCALC 61 06/07/2014 1018      Wt Readings from Last 3 Encounters:  09/27/14 87.998 kg (194 lb)  05/20/14 83.462 kg (184 lb)  05/11/14 79.379 kg (175 lb)      Other studies Reviewed: Additional studies/ records that were reviewed today include: . Review of the above records demonstrates: No new data   ASSESSMENT AND PLAN:  1. S/P AVR Clinically, function is normal. No aortic regurgitation is noted.  2. Essential hypertension Very poor control. Diuretic therapy was taken away post surgery. We will make medication adjustment to bring better blood pressure control.  3. Aortic root enlargement Aortic enlargement of 3.9 cm prior to surgery. We need to prevent progression by continuing beta blocker therapy    Current medicines are reviewed at length with the patient today.  The patient does not have concerns regarding medicines.  The following changes have been made:  Because of poor blood pressure control, lisinopril 5 mg daily will be discontinued and lisinopril HCT 10/0.5 milligrams will be started. A basic metabolic panel will be done in 7-10 days after the switch.  Labs/ tests ordered today include:  No orders of the defined types were placed in this encounter.     Disposition:   FU with HS in 4-6 months  Signed, Sinclair Grooms, MD  09/27/2014 11:32 AM    [facility] [address], [place], [place]  [address] Phone: [phone]; Fax: [phone]

## 2014-09-27 ENCOUNTER — Encounter: Payer: Self-pay | Admitting: Interventional Cardiology

## 2014-09-27 ENCOUNTER — Ambulatory Visit (INDEPENDENT_AMBULATORY_CARE_PROVIDER_SITE_OTHER): Payer: Medicare Other | Admitting: Interventional Cardiology

## 2014-09-27 VITALS — BP 148/98 | HR 85 | Ht 69.0 in | Wt 194.0 lb

## 2014-09-27 DIAGNOSIS — Z952 Presence of prosthetic heart valve: Secondary | ICD-10-CM

## 2014-09-27 DIAGNOSIS — I77819 Aortic ectasia, unspecified site: Secondary | ICD-10-CM | POA: Diagnosis not present

## 2014-09-27 DIAGNOSIS — I7789 Other specified disorders of arteries and arterioles: Secondary | ICD-10-CM

## 2014-09-27 DIAGNOSIS — Z954 Presence of other heart-valve replacement: Secondary | ICD-10-CM | POA: Diagnosis not present

## 2014-09-27 DIAGNOSIS — I10 Essential (primary) hypertension: Secondary | ICD-10-CM | POA: Diagnosis not present

## 2014-09-27 MED ORDER — LISINOPRIL-HYDROCHLOROTHIAZIDE 10-12.5 MG PO TABS: 1.0000 | ORAL_TABLET | Freq: Every day | ORAL | Status: DC

## 2014-09-27 NOTE — Patient Instructions (Addendum)
Medication Instructions:  Your physician has recommended you make the following change in your medication STOP Lisinopril START Lisinopril HCTZ 10-12.5mg . An Rx has been sent to your pharmacy  Labwork: Your physician recommends that you return for lab work on 10/12/14 (bmet)   Testing/Procedures: None   Follow-Up: Your physician wants you to follow-up in: 6 months with Dr.Smith You will receive a reminder letter in the mail two months in advance. If you don't receive a letter, please call our office to schedule the follow-up appointment.   Any Other Special Instructions Will Be Listed Below (If Applicable).

## 2014-10-12 ENCOUNTER — Other Ambulatory Visit (INDEPENDENT_AMBULATORY_CARE_PROVIDER_SITE_OTHER): Payer: Medicare Other | Admitting: *Deleted

## 2014-10-12 DIAGNOSIS — I10 Essential (primary) hypertension: Secondary | ICD-10-CM | POA: Diagnosis not present

## 2014-10-12 LAB — BASIC METABOLIC PANEL
BUN: 12 mg/dL (ref 6–23)
CO2: 26 meq/L (ref 19–32)
Calcium: 9.3 mg/dL (ref 8.4–10.5)
Chloride: 108 mEq/L (ref 96–112)
Creatinine, Ser: 1.16 mg/dL (ref 0.40–1.50)
GFR: 79.07 mL/min (ref >60.00)
Glucose, Bld: 96 mg/dL (ref 70–99)
POTASSIUM: 3.6 meq/L (ref 3.5–5.1)
SODIUM: 143 meq/L (ref 135–145)

## 2014-10-12 NOTE — Addendum Note (Signed)
Addended by: Eulis Foster on: 10/12/2014 08:13 AM   Modules accepted: Orders

## 2014-10-14 ENCOUNTER — Telehealth: Payer: Self-pay

## 2014-10-14 NOTE — Telephone Encounter (Signed)
lmom.The patient's blood work is normal. No changes are needed. Pt is to call back if any questions

## 2014-10-14 NOTE — Telephone Encounter (Signed)
-----   Message from Belva Crome, MD sent at 10/12/2014  6:32 PM EDT ----- The patient's blood work is normal. No changes are needed.

## 2015-03-29 ENCOUNTER — Ambulatory Visit
Admission: RE | Admit: 2015-03-29 | Discharge: 2015-03-29 | Disposition: A | Discharge status: Home / Self Care | Payer: Medicare Other | Source: Ambulatory Visit | Attending: Physician Assistant | Admitting: Physician Assistant

## 2015-03-29 ENCOUNTER — Other Ambulatory Visit: Payer: Self-pay | Admitting: Physician Assistant

## 2015-03-29 DIAGNOSIS — T1490XA Injury, unspecified, initial encounter: Secondary | ICD-10-CM

## 2015-05-08 NOTE — Progress Notes (Signed)
Cardiology Office Note:    Date:  05/09/2015   ID:  Frederick Burnett, DOB September 30, 1940, MRN [id number]  PCP:  Donnie Coffin, MD  Cardiologist:  Dr. Daneen Schick   Electrophysiologist:  n/a  Chief Complaint  Patient presents with  . s/p AVR    ECG - Follow up    History of Present Illness:     [name] is a 75 y.o. male with a hx of HTN, HL, aortic insufficiency. Echocardiogram in January demonstrated progression of aortic disease in reduce EF of 35-40%. He underwent cardiac catheterization which demonstrated mild nonobstructive CAD. Transesophageal echocardiogram confirmed severe aortic regurgitation and reduced EF of 35-40%. He was referred to Dr. Cyndia Bent for aortic valve replacement. He was admitted 2/15-2/19 and underwent bioprosthetic aortic valve replacement. Postoperative course was uneventful and he remained in NSR.   Readmitted 2/28-3/1 with left leg DVT and bilateral pulmonary emboli. He completed 6 mos of Xarelto.  He has been noted to be in accelerated junctional rhythm in the past.  Last seen by Dr. Daneen Schick 8/16.    He returns for follow-up. He is overall doing well.  He denies significant DOE. 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 He is NYHA 2.  He denies orthopnea, PND, edema. He denies syncope.  He needs a letter to complete his annual physical with the school system.   Past Medical History  Diagnosis Date  . Hypercholesteremia   . HTN (hypertension)   . Aortic regurgitation   . Aortic root enlargement ([place])   . Aortic regurgitation   . Heart murmur   . Arthritis     both shoulders - RC- wear  . Hx of echocardiogram     Echo 3/16: mild LVH, EF 40-45%, ant-septal AK, AVR with elevated velocity (3.3 m/s - slightly above expected value), mild LAE    Past Surgical History  Procedure Laterality Date  . Multiple tooth extractions    . Left and right heart catheterization with coronary angiogram N/A 02/22/2014      Procedure: LEFT AND RIGHT HEART CATHETERIZATION WITH CORONARY ANGIOGRAM;  Surgeon: Sinclair Grooms, MD;  Location: [facility] CATH LAB;  Service: Cardiovascular;  Laterality: N/A;  . Cardiac catheterization    . Tee without cardioversion N/A 03/24/2014    Procedure: TRANSESOPHAGEAL ECHOCARDIOGRAM (TEE);  Surgeon: Dorothy Spark, MD;  Location: [place];  Service: Cardiovascular;  Laterality: N/A;  . Aortic valve replacement N/A 04/11/2014    Procedure: AORTIC VALVE REPLACEMENT (AVR);  Surgeon: Gaye Pollack, MD;  Location: [place];  Service: Open Heart Surgery;  Laterality: N/A;  . Tee without cardioversion N/A 04/11/2014    Procedure: TRANSESOPHAGEAL ECHOCARDIOGRAM (TEE);  Surgeon: Gaye Pollack, MD;  Location: [place];  Service: Open Heart Surgery;  Laterality: N/A;    Current Medications: Outpatient Prescriptions Prior to Visit  Medication Sig Dispense Refill  . aspirin EC 81 MG EC tablet Take 1 tablet (81 mg total) by mouth daily.    Marland Kitchen atorvastatin (LIPITOR) 20 MG tablet Take 1 tablet (20 mg total) by mouth daily. 30 tablet 11  . metoprolol tartrate (LOPRESSOR) 25 MG tablet Take 1 tablet (25 mg total) by mouth 2 (two) times daily. 60 tablet 3  . Multiple Vitamin (MULTIVITAMIN) capsule Take 1 capsule by mouth daily.    Marland Kitchen lisinopril-hydrochlorothiazide (PRINZIDE) 10-12.5 MG per tablet Take 1 tablet by mouth daily. 30 tablet 11  . acetaminophen (TYLENOL) 500 MG  tablet Take 1,000 mg by mouth every 6 (six) hours as needed for mild pain, fever or headache.     . rivaroxaban (XARELTO) 20 MG TABS tablet Take 1 tablet (20 mg total) by mouth daily with supper. 30 tablet 6   No facility-administered medications prior to visit.     Allergies:   Review of patient's allergies indicates no known allergies.   Social History   Social History  . Marital Status: Widowed    Spouse Name: N/A  . Number of Children: N/A  . Years of Education: N/A   Social History Main Topics  . Smoking status: Former  Smoker    Types: Cigars    Quit date: 05/29/2001  . Smokeless tobacco: Never Used  . Alcohol Use: No  . Drug Use: No  . Sexual Activity: Not Asked   Other Topics Concern  . None   Social History Narrative     Family History:  The patient's family history includes Heart attack in his father; Hypertension in his brother, daughter, mother, and sister. There is no history of Stroke.   ROS:   Please see the history of present illness.    Review of Systems  Respiratory: Positive for cough.   All other systems reviewed and are negative.   Physical Exam:    VS:  BP 160/80 mmHg  Pulse 84  Ht 5\' 9"  (1.753 m)  Wt 201 lb 9.6 oz (91.445 kg)  BMI 29.76 kg/m2   GEN: Well nourished, well developed, in no acute distress HEENT: normal Neck: no JVD, no masses Cardiac: Normal 99991111, RRR; 2/6 systolic murmur RUSB, rubs, or gallops, no edema;  no carotid bruits,   Respiratory:  clear to auscultation bilaterally; no wheezing, rhonchi or rales GI: soft, nontender, nondistended MS: no deformity or atrophy Skin: warm and dry Neuro: No focal deficits  Psych: Alert and oriented x 3, normal affect  Wt Readings from Last 3 Encounters:  05/09/15 201 lb 9.6 oz (91.445 kg)  09/27/14 194 lb (87.998 kg)  05/20/14 184 lb (83.462 kg)      Studies/Labs Reviewed:     EKG:  EKG is  ordered today.  The ekg ordered today demonstrates Accelerated Junctional Rhythm, underlying rhythm is not clear, reviewed with Dr. Cristopher Peru (EP) and this is not AFib, HR 84 - similar to prior tracing  Recent Labs: 06/07/2014: ALT 18 10/12/2014: BUN 12; Creatinine, Ser 1.16; Potassium 3.6; Sodium 143   Recent Lipid Panel    Component Value Date/Time   CHOL 127 06/07/2014 1018   TRIG 81.0 06/07/2014 1018   HDL 49.60 06/07/2014 1018   CHOLHDL 3 06/07/2014 1018   VLDL 16.2 06/07/2014 1018   LDLCALC 61 06/07/2014 1018    Additional studies/ records that were reviewed today include:   Echo 05/26/14 Mild  concentric LVH, EF 40-45%, anteroseptal akinesis, aortic valve prosthesis functioning normally (mean gradient 19 mmHg), mild LAE  Carotid US 04/07/14 Bilat 1-39% ICA  Transesophageal echocardiogram 03/24/14 Mild concentric LVH, EF 35-40%, diffuse HK, severe AI, descending aorta mildly dilated at 29 mm  Cardiac cath 02/22/14 LM: widely patent. LAD: widely patent but reveals proximal and mid luminal irregularities. LCx: widely patent and contains 50% mid vessel stenosis after the first obtuse marginal branch.  RCA: Patent with proximal and mid irregularities.  Severe aortic regurgitation with aortic root enlargement EF of 35-40%. Moderate mitral regurgitation is noted.   ASSESSMENT:     1. S/P AVR   2. History of pulmonary embolism  3. NICM (nonischemic cardiomyopathy) ([place])   4. Essential hypertension   5. HLD (hyperlipidemia)   6. Coronary artery disease    7. Accelerated junctional rhythm     PLAN:     In order of problems listed above:  1. Aortic regurgitation S/P AVR - Doing well. Continue SBE prophylaxis.  He has a soft murmur on exam.  Will get FU echo.    2. Hx of PE (pulmonary embolism) - He has completed 6 mos of anticoagulation.  He is no longer on Xarelto.   3. NICM (nonischemic cardiomyopathy) -  EF 40-45% in 3/16 by echo.  Continue beta-blocker, ACE inhibitor.  FU echo to be arranged as noted.  He denies symptoms of CHF.   3. Essential hypertension - BP elevated.  Increase Lisinopril/HCTZ to 20/25 mg QD.  BMET 1 week.    4. HLD (hyperlipidemia) - Continue statin.  LDL in 4/16 was 61.    5. Coronary artery disease -  No angina. Continue ASA, statin, beta-blocker.  6. Accelerated Junctional Rhythm -  As noted, I reviewed his ECG with Dr. Lovena Le.  Patient is asymptomatic. No change in therapy.  I decided not to adjust his beta-blocker b/c of his current rhythm.      Medication Adjustments/Labs and Tests Ordered: Current medicines are reviewed at length  with the patient today.  Concerns regarding medicines are outlined above.  Medication changes, Labs and Tests ordered today are outlined in the Patient Instructions noted below. Patient Instructions  Medication Instructions:  Your physician has recommended you make the following change in your medication:  1.  INCREASE the Lisinopril-HCTZ to 20-25 mg taking 1 tablet daily  Labwork: 1 WEEK:  BMET  Testing/Procedures: Your physician has requested that you have an echocardiogram. Echocardiography is a painless test that uses sound waves to create images of your heart. It provides your doctor with information about the size and shape of your heart and how well your heart's chambers and valves are working. This procedure takes approximately one hour. There are no restrictions for this procedure.  Follow-Up: Your physician wants you to follow-up in: [place] DR. Gaspar Bidding will receive a reminder letter in the mail two months in advance. If you don't receive a letter, please call our office to schedule the follow-up appointment.   Any Other Special Instructions Will Be Listed Below (If Applicable). If you need a refill on your cardiac medications before your next appointment, please call your pharmacy.   Signed, Richardson Dopp, PA-C  05/09/2015 12:30 PM    [facility] [place], [place], [place]  [address] Phone: [phone]; Fax: [phone]

## 2015-05-09 ENCOUNTER — Encounter: Payer: Self-pay | Admitting: Physician Assistant

## 2015-05-09 ENCOUNTER — Ambulatory Visit (INDEPENDENT_AMBULATORY_CARE_PROVIDER_SITE_OTHER): Payer: Medicare Other | Admitting: Physician Assistant

## 2015-05-09 VITALS — BP 160/80 | HR 84 | Ht 69.0 in | Wt 201.6 lb

## 2015-05-09 DIAGNOSIS — Z954 Presence of other heart-valve replacement: Secondary | ICD-10-CM | POA: Diagnosis not present

## 2015-05-09 DIAGNOSIS — E785 Hyperlipidemia, unspecified: Secondary | ICD-10-CM | POA: Diagnosis not present

## 2015-05-09 DIAGNOSIS — I10 Essential (primary) hypertension: Secondary | ICD-10-CM | POA: Diagnosis not present

## 2015-05-09 DIAGNOSIS — I2510 Atherosclerotic heart disease of native coronary artery without angina pectoris: Secondary | ICD-10-CM

## 2015-05-09 DIAGNOSIS — Z86711 Personal history of pulmonary embolism: Secondary | ICD-10-CM

## 2015-05-09 DIAGNOSIS — I429 Cardiomyopathy, unspecified: Secondary | ICD-10-CM

## 2015-05-09 DIAGNOSIS — I428 Other cardiomyopathies: Secondary | ICD-10-CM

## 2015-05-09 DIAGNOSIS — I498 Other specified cardiac arrhythmias: Secondary | ICD-10-CM

## 2015-05-09 DIAGNOSIS — Z952 Presence of prosthetic heart valve: Secondary | ICD-10-CM

## 2015-05-09 MED ORDER — LISINOPRIL-HYDROCHLOROTHIAZIDE 20-25 MG PO TABS: 1.0000 | ORAL_TABLET | Freq: Every day | ORAL | Status: DC

## 2015-05-09 NOTE — Patient Instructions (Addendum)
Medication Instructions:  Your physician has recommended you make the following change in your medication:  1.  INCREASE the Lisinopril-HCTZ to 20-25 mg taking 1 tablet daily  Labwork: 1 WEEK:  BMET  Testing/Procedures: Your physician has requested that you have an echocardiogram. Echocardiography is a painless test that uses sound waves to create images of your heart. It provides your doctor with information about the size and shape of your heart and how well your heart's chambers and valves are working. This procedure takes approximately one hour. There are no restrictions for this procedure.  Follow-Up: Your physician wants you to follow-up in: [place] DR. Gaspar Bidding will receive a reminder letter in the mail two months in advance. If you don't receive a letter, please call our office to schedule the follow-up appointment.   Any Other Special Instructions Will Be Listed Below (If Applicable). Echocardiogram An echocardiogram, or echocardiography, uses sound waves (ultrasound) to produce an image of your heart. The echocardiogram is simple, painless, obtained within a short period of time, and offers valuable information to your health care provider. The images from an echocardiogram can provide information such as:  Evidence of coronary artery disease (CAD).  Heart size.  Heart muscle function.  Heart valve function.  Aneurysm detection.  Evidence of a past heart attack.  Fluid buildup around the heart.  Heart muscle thickening.  Assess heart valve function. LET [facility] CARE PROVIDER KNOW ABOUT:  Any allergies you have.  All medicines you are taking, including vitamins, herbs, eye drops, creams, and over-the-counter medicines.  Previous problems you or members of your family have had with the use of anesthetics.  Any blood disorders you have.  Previous surgeries you have had.  Medical conditions you have.  Possibility of pregnancy, if this applies. BEFORE  THE PROCEDURE  No special preparation is needed. Eat and drink normally.  PROCEDURE   In order to produce an image of your heart, gel will be applied to your chest and a wand-like tool (transducer) will be moved over your chest. The gel will help transmit the sound waves from the transducer. The sound waves will harmlessly bounce off your heart to allow the heart images to be captured in real-time motion. These images will then be recorded.  You may need an IV to receive a medicine that improves the quality of the pictures. AFTER THE PROCEDURE You may return to your normal schedule including diet, activities, and medicines, unless your health care provider tells you otherwise.   This information is not intended to replace advice given to you by your health care provider. Make sure you discuss any questions you have with your health care provider.   Document Released: 02/09/2000 Document Revised: 03/04/2014 Document Reviewed: 10/19/2012 Elsevier Interactive Patient Education [employer].  If you need a refill on your cardiac medications before your next appointment, please call your pharmacy.

## 2015-05-23 ENCOUNTER — Other Ambulatory Visit (INDEPENDENT_AMBULATORY_CARE_PROVIDER_SITE_OTHER): Payer: Medicare Other | Admitting: *Deleted

## 2015-05-23 ENCOUNTER — Other Ambulatory Visit: Payer: Self-pay

## 2015-05-23 ENCOUNTER — Ambulatory Visit (HOSPITAL_COMMUNITY): Payer: Medicare Other | Attending: Cardiology

## 2015-05-23 ENCOUNTER — Encounter: Payer: Self-pay | Admitting: Physician Assistant

## 2015-05-23 DIAGNOSIS — I2510 Atherosclerotic heart disease of native coronary artery without angina pectoris: Secondary | ICD-10-CM | POA: Diagnosis not present

## 2015-05-23 DIAGNOSIS — Z953 Presence of xenogenic heart valve: Secondary | ICD-10-CM | POA: Insufficient documentation

## 2015-05-23 DIAGNOSIS — I119 Hypertensive heart disease without heart failure: Secondary | ICD-10-CM | POA: Insufficient documentation

## 2015-05-23 DIAGNOSIS — Z87891 Personal history of nicotine dependence: Secondary | ICD-10-CM | POA: Diagnosis not present

## 2015-05-23 DIAGNOSIS — Z8249 Family history of ischemic heart disease and other diseases of the circulatory system: Secondary | ICD-10-CM | POA: Insufficient documentation

## 2015-05-23 DIAGNOSIS — Z952 Presence of prosthetic heart valve: Secondary | ICD-10-CM | POA: Diagnosis present

## 2015-05-23 DIAGNOSIS — I10 Essential (primary) hypertension: Secondary | ICD-10-CM | POA: Diagnosis not present

## 2015-05-23 DIAGNOSIS — I429 Cardiomyopathy, unspecified: Secondary | ICD-10-CM | POA: Diagnosis not present

## 2015-05-23 DIAGNOSIS — E785 Hyperlipidemia, unspecified: Secondary | ICD-10-CM | POA: Insufficient documentation

## 2015-05-23 DIAGNOSIS — Z954 Presence of other heart-valve replacement: Secondary | ICD-10-CM

## 2015-05-23 DIAGNOSIS — I059 Rheumatic mitral valve disease, unspecified: Secondary | ICD-10-CM | POA: Insufficient documentation

## 2015-05-23 LAB — BASIC METABOLIC PANEL
BUN: 15 mg/dL (ref 7–25)
CO2: 27 mmol/L (ref 20–31)
Calcium: 9.4 mg/dL (ref 8.6–10.3)
Chloride: 106 mmol/L (ref 98–110)
Creat: 1.23 mg/dL — ABNORMAL HIGH (ref 0.70–1.18)
Glucose, Bld: 92 mg/dL (ref 65–99)
Potassium: 3.8 mmol/L (ref 3.5–5.3)
Sodium: 141 mmol/L (ref 135–146)

## 2015-05-24 ENCOUNTER — Telehealth: Payer: Self-pay | Admitting: *Deleted

## 2015-05-24 ENCOUNTER — Other Ambulatory Visit: Payer: Self-pay | Admitting: Physician Assistant

## 2015-05-24 DIAGNOSIS — I10 Essential (primary) hypertension: Secondary | ICD-10-CM

## 2015-05-24 NOTE — Telephone Encounter (Signed)
Lmtcb to go over echo and lab results 

## 2015-05-24 NOTE — Telephone Encounter (Signed)
Pt notified of lab and echo results by phone. Pt agreeable to have repeat bmet 4/14. Pt verbalized understanding to results.

## 2015-05-24 NOTE — Telephone Encounter (Signed)
Pt rtn call to Arbie Cookey re Echo and Lab results

## 2015-06-09 ENCOUNTER — Other Ambulatory Visit (INDEPENDENT_AMBULATORY_CARE_PROVIDER_SITE_OTHER): Payer: Medicare Other | Admitting: *Deleted

## 2015-06-09 ENCOUNTER — Telehealth: Payer: Self-pay | Admitting: *Deleted

## 2015-06-09 DIAGNOSIS — I10 Essential (primary) hypertension: Secondary | ICD-10-CM | POA: Diagnosis not present

## 2015-06-09 DIAGNOSIS — Z952 Presence of prosthetic heart valve: Secondary | ICD-10-CM

## 2015-06-09 LAB — BASIC METABOLIC PANEL
BUN: 16 mg/dL (ref 7–25)
CO2: 27 mmol/L (ref 20–31)
Calcium: 9 mg/dL (ref 8.6–10.3)
Chloride: 108 mmol/L (ref 98–110)
Creat: 1.31 mg/dL — ABNORMAL HIGH (ref 0.70–1.18)
GLUCOSE: 83 mg/dL (ref 65–99)
Potassium: 3.8 mmol/L (ref 3.5–5.3)
SODIUM: 143 mmol/L (ref 135–146)

## 2015-06-09 MED ORDER — LISINOPRIL-HYDROCHLOROTHIAZIDE 20-25 MG PO TABS: 0.5000 | ORAL_TABLET | Freq: Every day | ORAL | Status: DC

## 2015-06-09 MED ORDER — HYDRALAZINE HCL 25 MG PO TABS: 25.0000 mg | ORAL_TABLET | Freq: Three times a day (TID) | ORAL | Status: DC

## 2015-06-09 NOTE — Telephone Encounter (Signed)
Pt notified of lab results and med changes to decrease Lisinopril/HCTZ to 10/12.5 mg daily; start Hydralazine 25 mg TID, BMET 4/28, ScottW. PA appt 5/15 11:10. Pt verbalized understanding to plan of care.

## 2015-06-09 NOTE — Addendum Note (Signed)
Addended by: Eulis Foster on: 06/09/2015 08:20 AM   Modules accepted: Orders

## 2015-06-23 ENCOUNTER — Other Ambulatory Visit (INDEPENDENT_AMBULATORY_CARE_PROVIDER_SITE_OTHER): Payer: Medicare Other | Admitting: *Deleted

## 2015-06-23 DIAGNOSIS — I10 Essential (primary) hypertension: Secondary | ICD-10-CM

## 2015-06-23 LAB — BASIC METABOLIC PANEL
BUN: 12 mg/dL (ref 7–25)
CO2: 21 mmol/L (ref 20–31)
Calcium: 8.8 mg/dL (ref 8.6–10.3)
Chloride: 108 mmol/L (ref 98–110)
Creat: 1.16 mg/dL (ref 0.70–1.18)
Glucose, Bld: 119 mg/dL — ABNORMAL HIGH (ref 65–99)
POTASSIUM: 3.7 mmol/L (ref 3.5–5.3)
SODIUM: 138 mmol/L (ref 135–146)

## 2015-07-09 NOTE — Progress Notes (Signed)
Cardiology Office Note:    Date:  07/10/2015   ID:  ROLLINS PUSEY, DOB [birth year], MRN [id number]  PCP:  Donnie Coffin, MD  Cardiologist:  Dr. Daneen Schick   Electrophysiologist:  n/a  Referring MD: Alroy Dust, Carlean Jews.Marlou Sa, MD   Chief Complaint  Patient presents with  . Follow-up    Hypertension    History of Present Illness:     Frederick Burnett is a 75 y.o. male with a hx of HTN, HL, aortic insufficiency. Echocardiogram in January 2016 demonstrated progression of aortic disease and reduced EF of 35-40%. He underwent cardiac catheterization which demonstrated mild nonobstructive CAD. Transesophageal echocardiogram confirmed severe aortic regurgitation and reduced EF of 35-40%. He was referred to Dr. Cyndia Bent for aortic valve replacement. In 2/16, he underwent bioprosthetic aortic valve replacement. Postoperative course was uneventful and he remained in NSR.   Readmitted 2/16 with left leg DVT and bilateral pulmonary emboli. He completed 6 mos of Xarelto. He has been noted to be in accelerated junctional rhythm in the past.   I saw him recently and adjusted his Lisinopril.  However, his creatinine increased.  I reduced it back to his usual dose and added Hydralazine. He returns for FU.  He denies chest pain or shortness of breath. He denies dizziness, near syncope or syncope. He denies orthopnea or PND. He has occasional ankle edema without significant change.  Past Medical History  Diagnosis Date  . Hypercholesteremia   . HTN (hypertension)   . Aortic regurgitation   . Aortic root enlargement ([place])   . Aortic regurgitation   . Heart murmur   . Arthritis     both shoulders - RC- wear  . Hx of echocardiogram     Echo 3/16: mild LVH, EF 40-45%, ant-septal AK, AVR with elevated velocity (3.3 m/s - slightly above expected value), mild LAE //  Echo 3/17: Mild LVH, EF 40%, diffuse HK, indeterminate diastolic function, bioprosthetic AVR well-functioning (mean gradient 9 mmHg), MAC, mild LAE,  mildly reduced RVSF, mild RAE    Past Surgical History  Procedure Laterality Date  . Multiple tooth extractions    . Left and right heart catheterization with coronary angiogram N/A 02/22/2014    Procedure: LEFT AND RIGHT HEART CATHETERIZATION WITH CORONARY ANGIOGRAM;  Surgeon: Sinclair Grooms, MD;  Location: [facility] CATH LAB;  Service: Cardiovascular;  Laterality: N/A;  . Cardiac catheterization    . Tee without cardioversion N/A 03/24/2014    Procedure: TRANSESOPHAGEAL ECHOCARDIOGRAM (TEE);  Surgeon: Dorothy Spark, MD;  Location: [place];  Service: Cardiovascular;  Laterality: N/A;  . Aortic valve replacement N/A 04/11/2014    Procedure: AORTIC VALVE REPLACEMENT (AVR);  Surgeon: Gaye Pollack, MD;  Location: [place];  Service: Open Heart Surgery;  Laterality: N/A;  . Tee without cardioversion N/A 04/11/2014    Procedure: TRANSESOPHAGEAL ECHOCARDIOGRAM (TEE);  Surgeon: Gaye Pollack, MD;  Location: [place];  Service: Open Heart Surgery;  Laterality: N/A;    Current Medications: Outpatient Prescriptions Prior to Visit  Medication Sig Dispense Refill  . aspirin EC 81 MG EC tablet Take 1 tablet (81 mg total) by mouth daily.    Marland Kitchen atorvastatin (LIPITOR) 20 MG tablet TAKE 1 TABLET (20 MG TOTAL) BY MOUTH DAILY. 30 tablet 11  . hydrALAZINE (APRESOLINE) 25 MG tablet Take 1 tablet (25 mg total) by mouth 3 (three) times daily. 90 tablet 11  . lisinopril-hydrochlorothiazide (PRINZIDE,ZESTORETIC) 20-25 MG tablet Take 0.5 tablets by mouth daily.    . metoprolol tartrate (  LOPRESSOR) 25 MG tablet Take 1 tablet (25 mg total) by mouth 2 (two) times daily. 60 tablet 3  . Multiple Vitamin (MULTIVITAMIN) capsule Take 1 capsule by mouth daily.     No facility-administered medications prior to visit.      Allergies:   Review of patient's allergies indicates no known allergies.   Social History   Social History  . Marital Status: Widowed    Spouse Name: N/A  . Number of Children: N/A  . Years of  Education: N/A   Social History Main Topics  . Smoking status: Former Smoker    Types: Cigars    Quit date: 05/29/2001  . Smokeless tobacco: Never Used  . Alcohol Use: No  . Drug Use: No  . Sexual Activity: Not Asked   Other Topics Concern  . None   Social History Narrative     Family History:  The patient's family history includes Heart attack in his father; Hypertension in his brother, daughter, mother, and sister. There is no history of Stroke.   ROS:   Please see the history of present illness.    Review of Systems  Respiratory: Positive for cough.   Musculoskeletal: Positive for joint swelling.   All other systems reviewed and are negative.   Physical Exam:    VS:  BP 100/60 mmHg  Pulse 74  Ht 5\' 9"  (1.753 m)  Wt 202 lb 6.4 oz (91.808 kg)  BMI 29.88 kg/m2   GEN: Well nourished, well developed, in no acute distress HEENT: normal Neck: no JVD, no masses Cardiac: Normal S1/S2, RRR; no murmurs, rubs, or gallops, trace bilateral ankle edema   Respiratory:  clear to auscultation bilaterally; no wheezing, rhonchi or rales GI: soft, nontender, nondistended MS: no deformity or atrophy Skin: warm and dry Neuro: No focal deficits  Psych: Alert and oriented x 3, normal affect  Wt Readings from Last 3 Encounters:  07/10/15 202 lb 6.4 oz (91.808 kg)  05/09/15 201 lb 9.6 oz (91.445 kg)  09/27/14 194 lb (87.998 kg)      Studies/Labs Reviewed:     EKG:  EKG is not ordered today.  The ekg ordered today demonstrates n/a  Recent Labs: 06/23/2015: BUN 12; Creat 1.16; Potassium 3.7; Sodium 138   Recent Lipid Panel    Component Value Date/Time   CHOL 127 06/07/2014 1018   TRIG 81.0 06/07/2014 1018   HDL 49.60 06/07/2014 1018   CHOLHDL 3 06/07/2014 1018   VLDL 16.2 06/07/2014 1018   LDLCALC 61 06/07/2014 1018    Additional studies/ records that were reviewed today include:   Echo 05/23/15 Mild LVH, EF 40-45%, anteroseptal akinesis, bioprosthetic AVR (mean 19 mmHg),  mild LAE  Echo 05/26/14 Mild concentric LVH, EF 40-45%, anteroseptal akinesis, aortic valve prosthesis functioning normally (mean gradient 19 mmHg), mild LAE  Carotid US 04/07/14 Bilat 1-39% ICA  Transesophageal echocardiogram 03/24/14 Mild concentric LVH, EF 35-40%, diffuse HK, severe AI, descending aorta mildly dilated at 29 mm  Cardiac cath 02/22/14 LM: widely patent. LAD: widely patent but reveals proximal and mid luminal irregularities. LCx: widely patent and contains 50% mid vessel stenosis after the first obtuse marginal branch.  RCA: Patent with proximal and mid irregularities.  Severe aortic regurgitation with aortic root enlargement EF of 35-40%. Moderate mitral regurgitation is noted.   ASSESSMENT:     1. S/P AVR   2. History of pulmonary embolism   3. NICM (nonischemic cardiomyopathy) ([place])   4. Essential hypertension   5. Coronary artery  disease      PLAN:     In order of problems listed above:  1. Aortic regurgitation S/P AVR - Doing well. Continue SBE prophylaxis. Echo in 3/17 stable with well functioning AVR.  2. Hx of PE (pulmonary embolism) - He has completed 6 mos of anticoagulation. He is no longer on Xarelto.   3. NICM (nonischemic cardiomyopathy) - EF 40-45%. Continue beta-blocker, ACE inhibitor, Hydralazine.  He denies symptoms of CHF.   4. Essential hypertension - BP much better controlled.  Continue current Rx.     5. Coronary artery disease - No angina. Continue ASA, statin, beta-blocker.    Medication Adjustments/Labs and Tests Ordered: Current medicines are reviewed at length with the patient today.  Concerns regarding medicines are outlined above.  Medication changes, Labs and Tests ordered today are outlined in the Patient Instructions noted below. Patient Instructions  Medication Instructions:  Your physician recommends that you continue on your current medications as directed. Please refer to the Current Medication list given  to you today. Labwork: NONE Testing/Procedures: NONE Follow-Up: 11/17/15 @ 11 AM WITH DR. Tamala Julian  Any Other Special Instructions Will Be Listed Below (If Applicable). If you need a refill on your cardiac medications before your next appointment, please call your pharmacy.    Signed, Richardson Dopp, PA-C  07/10/2015 [facility] [place], [place], [place]  [address] Phone: [phone]; Fax: [phone]

## 2015-07-10 ENCOUNTER — Ambulatory Visit (INDEPENDENT_AMBULATORY_CARE_PROVIDER_SITE_OTHER): Payer: Medicare Other | Admitting: Physician Assistant

## 2015-07-10 ENCOUNTER — Encounter: Payer: Self-pay | Admitting: Physician Assistant

## 2015-07-10 VITALS — BP 100/60 | HR 74 | Ht 69.0 in | Wt 202.4 lb

## 2015-07-10 DIAGNOSIS — I429 Cardiomyopathy, unspecified: Secondary | ICD-10-CM

## 2015-07-10 DIAGNOSIS — I10 Essential (primary) hypertension: Secondary | ICD-10-CM | POA: Diagnosis not present

## 2015-07-10 DIAGNOSIS — Z86711 Personal history of pulmonary embolism: Secondary | ICD-10-CM | POA: Diagnosis not present

## 2015-07-10 DIAGNOSIS — Z952 Presence of prosthetic heart valve: Secondary | ICD-10-CM

## 2015-07-10 DIAGNOSIS — I2510 Atherosclerotic heart disease of native coronary artery without angina pectoris: Secondary | ICD-10-CM

## 2015-07-10 DIAGNOSIS — Z954 Presence of other heart-valve replacement: Secondary | ICD-10-CM | POA: Diagnosis not present

## 2015-07-10 DIAGNOSIS — I428 Other cardiomyopathies: Secondary | ICD-10-CM

## 2015-07-10 NOTE — Patient Instructions (Addendum)
Medication Instructions:  Your physician recommends that you continue on your current medications as directed. Please refer to the Current Medication list given to you today. Labwork: NONE Testing/Procedures: NONE Follow-Up: 11/17/15 @ 11 AM WITH DR. Tamala Julian  Any Other Special Instructions Will Be Listed Below (If Applicable). If you need a refill on your cardiac medications before your next appointment, please call your pharmacy.

## 2015-11-17 ENCOUNTER — Ambulatory Visit (INDEPENDENT_AMBULATORY_CARE_PROVIDER_SITE_OTHER): Payer: Medicare Other | Admitting: Interventional Cardiology

## 2015-11-17 ENCOUNTER — Encounter: Payer: Self-pay | Admitting: Interventional Cardiology

## 2015-11-17 VITALS — BP 106/76 | HR 78 | Ht 69.0 in | Wt 201.8 lb

## 2015-11-17 DIAGNOSIS — I10 Essential (primary) hypertension: Secondary | ICD-10-CM

## 2015-11-17 DIAGNOSIS — Z952 Presence of prosthetic heart valve: Secondary | ICD-10-CM

## 2015-11-17 DIAGNOSIS — Z954 Presence of other heart-valve replacement: Secondary | ICD-10-CM

## 2015-11-17 DIAGNOSIS — I428 Other cardiomyopathies: Secondary | ICD-10-CM

## 2015-11-17 DIAGNOSIS — I2699 Other pulmonary embolism without acute cor pulmonale: Secondary | ICD-10-CM

## 2015-11-17 DIAGNOSIS — I429 Cardiomyopathy, unspecified: Secondary | ICD-10-CM

## 2015-11-17 MED ORDER — HYDRALAZINE HCL 25 MG PO TABS: 25.0000 mg | ORAL_TABLET | Freq: Every day | ORAL | Status: DC

## 2015-11-17 NOTE — Patient Instructions (Signed)
Medication Instructions:  Your physician has recommended you make the following change in your medication:  REDUCE Hydralazine to 25mg  daily   Labwork: None ordered  Testing/Procedures: None ordered  Follow-Up: Your physician wants you to follow-up in: 1 year with Dr.Smith You will receive a reminder letter in the mail two months in advance. If you don't receive a letter, please call our office to schedule the follow-up appointment.   Any Other Special Instructions Will Be Listed Below (If Applicable).     If you need a refill on your cardiac medications before your next appointment, please call your pharmacy.

## 2015-11-17 NOTE — Progress Notes (Signed)
Cardiology Office Note    Date:  11/17/2015   ID:  Frederick Burnett August 02, 1940, MRN [id number]  PCP:  Frederick Coffin, MD  Cardiologist: Frederick Grooms, MD   Chief Complaint  Patient presents with  . Cardiac Valve Problem    History of Present Illness:  Frederick Burnett is a 75 y.o. male who presents for bicuspid aortic valve with severe aortic regurgitation treated with bioprosthetic valve, essential hypertension.  He is doing well. He is participating in his full range of activities. He has not had syncope, dyspnea, or other complaints.   Past Medical History:  Diagnosis Date  . Aortic regurgitation   . Aortic regurgitation   . Aortic root enlargement ([place])   . Arthritis    both shoulders - RC- wear  . Heart murmur   . HTN (hypertension)   . Hx of echocardiogram    Echo 3/16: mild LVH, EF 40-45%, ant-septal AK, AVR with elevated velocity (3.3 m/s - slightly above expected value), mild LAE //  Echo 3/17: Mild LVH, EF 40%, diffuse HK, indeterminate diastolic function, bioprosthetic AVR well-functioning (mean gradient 9 mmHg), MAC, mild LAE, mildly reduced RVSF, mild RAE  . Hypercholesteremia     Past Surgical History:  Procedure Laterality Date  . AORTIC VALVE REPLACEMENT N/A 04/11/2014   Procedure: AORTIC VALVE REPLACEMENT (AVR);  Surgeon: Gaye Pollack, MD;  Location: [facility];  Service: Open Heart Surgery;  Laterality: N/A;  . CARDIAC CATHETERIZATION    . LEFT AND RIGHT HEART CATHETERIZATION WITH CORONARY ANGIOGRAM N/A 02/22/2014   Procedure: LEFT AND RIGHT HEART CATHETERIZATION WITH CORONARY ANGIOGRAM;  Surgeon: Frederick Grooms, MD;  Location[facility];  Service: Cardiovascular;  Laterality: N/A;  . MULTIPLE TOOTH EXTRACTIONS    . TEE WITHOUT CARDIOVERSION N/A 03/24/2014   Procedure: TRANSESOPHAGEAL ECHOCARDIOGRAM (TEE);  Surgeon: Dorothy Spark, MD;  Location: [place];  Service: Cardiovascular;  Laterality: N/A;  . TEE WITHOUT CARDIOVERSION N/A 04/11/2014     Procedure: TRANSESOPHAGEAL ECHOCARDIOGRAM (TEE);  Surgeon: Gaye Pollack, MD;  Location: [place];  Service: Open Heart Surgery;  Laterality: N/A;    Current Medications: Outpatient Medications Prior to Visit  Medication Sig Dispense Refill  . aspirin EC 81 MG EC tablet Take 1 tablet (81 mg total) by mouth daily.    Marland Kitchen atorvastatin (LIPITOR) 20 MG tablet TAKE 1 TABLET (20 MG TOTAL) BY MOUTH DAILY. 30 tablet 11  . hydrALAZINE (APRESOLINE) 25 MG tablet Take 1 tablet (25 mg total) by mouth 3 (three) times daily. 90 tablet 11  . lisinopril-hydrochlorothiazide (PRINZIDE,ZESTORETIC) 20-25 MG tablet Take 0.5 tablets by mouth daily.    . metoprolol tartrate (LOPRESSOR) 25 MG tablet Take 1 tablet (25 mg total) by mouth 2 (two) times daily. 60 tablet 3  . Multiple Vitamin (MULTIVITAMIN) capsule Take 1 capsule by mouth daily.     No facility-administered medications prior to visit.      Allergies:   Review of patient's allergies indicates no known allergies.   Social History   Social History  . Marital status: Widowed    Spouse name: N/A  . Number of children: N/A  . Years of education: N/A   Social History Main Topics  . Smoking status: Former Smoker    Types: Cigars    Quit date: 05/29/2001  . Smokeless tobacco: Never Used  . Alcohol use No  . Drug use: No  . Sexual activity: Not Asked   Other Topics Concern  .  None   Social History Narrative  . None     Family History:  The patient's family history includes Heart attack in his father; Hypertension in his brother, daughter, mother, and sister.   ROS:   Please see the history of present illness.    Cough, but otherwise no complaints.  All other systems reviewed and are negative.   PHYSICAL EXAM:   VS:  BP 106/76   Pulse 78   Ht 5\' 9"  (1.753 m)   Wt 201 lb 12.8 oz (91.5 kg)   BMI 29.80 kg/m    GEN: Well nourished, well developed, in no acute distress  HEENT: normal  Neck: no JVD, carotid bruits, or masses Cardiac: RRR;  no murmurs, rubs, or gallops,no edema  Respiratory:  clear to auscultation bilaterally, normal work of breathing GI: soft, nontender, nondistended, + BS MS: no deformity or atrophy  Skin: warm and dry, no rash Neuro:  Alert and Oriented x 3, Strength and sensation are intact Psych: euthymic mood, full affect  Wt Readings from Last 3 Encounters:  11/17/15 201 lb 12.8 oz (91.5 kg)  07/10/15 202 lb 6.4 oz (91.8 kg)  05/09/15 201 lb 9.6 oz (91.4 kg)      Studies/Labs Reviewed:   EKG:  EKG  Not repeated. Last EKG done in March demonstrated a junctional rhythm, accelerated at 80 bpm.  Recent Labs: 06/23/2015: BUN 12; Creat 1.16; Potassium 3.7; Sodium 138   Lipid Panel    Component Value Date/Time   CHOL 127 06/07/2014 1018   TRIG 81.0 06/07/2014 1018   HDL 49.60 06/07/2014 1018   CHOLHDL 3 06/07/2014 1018   VLDL 16.2 06/07/2014 1018   LDLCALC 61 06/07/2014 1018    Additional studies/ records that were reviewed today include:  Echocardiogram 05/23/15: Study Conclusions  - Left ventricle: The cavity size was normal. Wall thickness was   increased in a pattern of mild LVH. The estimated ejection   fraction was 40%. Diffuse hypokinesis. Indeterminant diastolic   function, ?junctional rhythm. - Aortic valve: Bioprosthetic aortic valve. There was no stenosis.   There was no regurgitation. Mean gradient (S): 9 mm Hg. - Mitral valve: Mildly calcified annulus. There was no significant   regurgitation. - Left atrium: The atrium was mildly dilated. - Right ventricle: The cavity size was normal. Systolic function   was mildly reduced. - Right atrium: The atrium was mildly dilated. - Pulmonary arteries: No complete TR doppler jet so unable to   estimate PA systolic pressure. - Inferior vena cava: The vessel was normal in size. The   respirophasic diameter changes were in the normal range (= 50%),   consistent with normal central venous pressure.  Impressions:  - Normal LV size  with mild LV hypertrophy. EF 40%, diffuse   hypokinesis. There was a bioprosthetic aortic valve that appeared   to be functioning normally. Normal RV size with mildly decreased   systolic function.   ASSESSMENT:    1. S/P AVR   2. Essential hypertension   3. PE (pulmonary embolism)   4. NICM (nonischemic cardiomyopathy) ([place])      PLAN:  In order of problems listed above:  1. Normally functioning aortic valve bioprosthesis. Asymptomatic. 2. Relatively low blood pressures. They were reassessed during the visit. Plan to discontinue hydralazine 3. No recent episodes. Identified by CT in February 2016. Anticoagulation therapy has been discontinued. 4. Last LVEF is 40%. No clinical evidence of heart failure. Plan to follow closely.    Medication Adjustments/Labs  and Tests Ordered: Current medicines are reviewed at length with the patient today.  Concerns regarding medicines are outlined above.  Medication changes, Labs and Tests ordered today are listed in the Patient Instructions below. There are no Patient Instructions on file for this visit.   Signed, Frederick Grooms, MD  11/17/2015 11:12 AM    [facility] [address], [place], [place]  [address] Phone: [phone]; Fax: [phone]

## 2016-02-12 ENCOUNTER — Telehealth: Payer: Self-pay | Admitting: Interventional Cardiology

## 2016-02-12 NOTE — Telephone Encounter (Signed)
pt need to call PCP. informed pt and they are in agreement to plan.

## 2016-02-12 NOTE — Telephone Encounter (Signed)
°*  STAT* If patient is at the pharmacy, call can be transferred to refill team.   1. Which medications need to be refilled? (please list name of each medication and dose if known) [place]   2. Which pharmacy/location (including street and city if local pharmacy) is medication to be sent to?[facility] on [address]   3. Do they need a 30 day or 90 day supply? [place]

## 2016-04-15 ENCOUNTER — Other Ambulatory Visit: Payer: Self-pay | Admitting: Interventional Cardiology

## 2016-05-28 ENCOUNTER — Telehealth: Payer: Self-pay | Admitting: Interventional Cardiology

## 2016-05-28 NOTE — Telephone Encounter (Signed)
New message   Pt verbalized that he needs a flight letter about his health for the [employer]

## 2016-05-28 NOTE — Telephone Encounter (Signed)
Attempted to call pt.  Message came on stating that number provided has been disconnected or no longer in service.

## 2016-05-29 ENCOUNTER — Telehealth: Payer: Self-pay | Admitting: Interventional Cardiology

## 2016-05-29 NOTE — Telephone Encounter (Signed)
Walk In pt Form-Pt needs letter for [employer]-please call when ready. Placed in [employer].

## 2016-05-30 NOTE — Telephone Encounter (Signed)
Walk in pt form received.  Pt asking for letter to take to [facility] stating that it is ok for him to drive.  Attempted to call pt.  VM has not been set up.  Will try again later.

## 2016-05-31 NOTE — Telephone Encounter (Signed)
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  Advised I would send message to Dr. Tamala Julian for review and advisement.  Advised pt Dr. Tamala Julian not back in the office until next week and I will call with an update.  Pt appreciative for assistance.

## 2016-05-31 NOTE — Telephone Encounter (Signed)
If he has had no cardiac symptoms, i.e. shortness of breath, syncope, or chest pain, he will be at low risk for incapacitating cardiac events. We should provide him with a letter when i'm next in the office.

## 2016-06-03 NOTE — Telephone Encounter (Signed)
Attempted to call pt.  VM has not been set up.  Will try again later.

## 2016-06-04 ENCOUNTER — Encounter: Payer: Self-pay | Admitting: *Deleted

## 2016-06-04 NOTE — Telephone Encounter (Signed)
Spoke with pt and he denies cardiac sx.  Advised I would get letter ready and place up front.  Pt verbalized understanding and was appreciative for assistance.

## 2016-06-23 ENCOUNTER — Other Ambulatory Visit: Payer: Self-pay | Admitting: Physician Assistant

## 2016-06-23 DIAGNOSIS — I10 Essential (primary) hypertension: Secondary | ICD-10-CM

## 2016-06-23 DIAGNOSIS — Z952 Presence of prosthetic heart valve: Secondary | ICD-10-CM

## 2016-07-17 IMAGING — CR DG CHEST 2V
2 series · 2 of 2 positions shown · non-contrast
Comparison: Chest CTA 01/05/2013.

CLINICAL DATA: 73-year-old male preoperative study for aortic
insufficiency, heart surgery. Initial encounter.

EXAM:
CHEST  2 VIEW

[w chest pa]
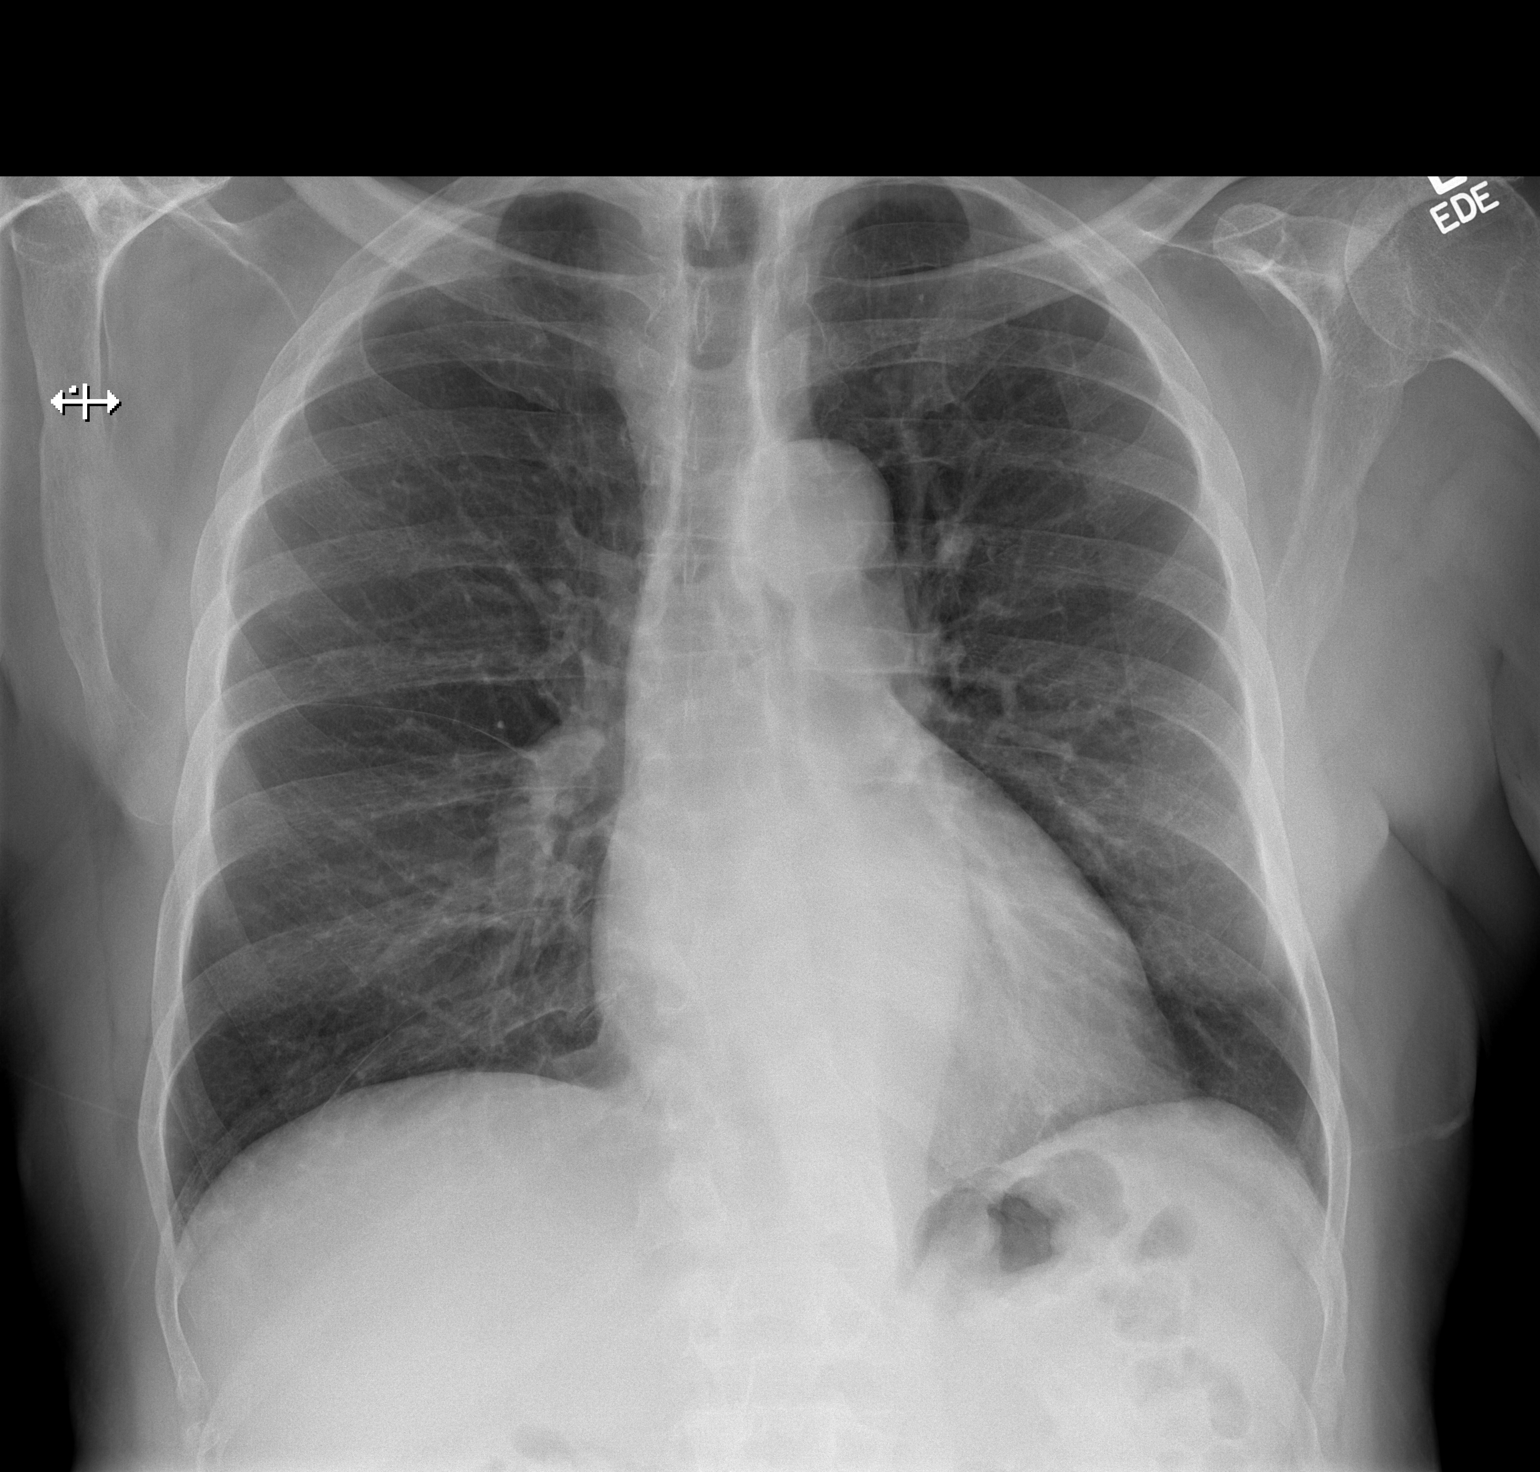

[w chest lat]
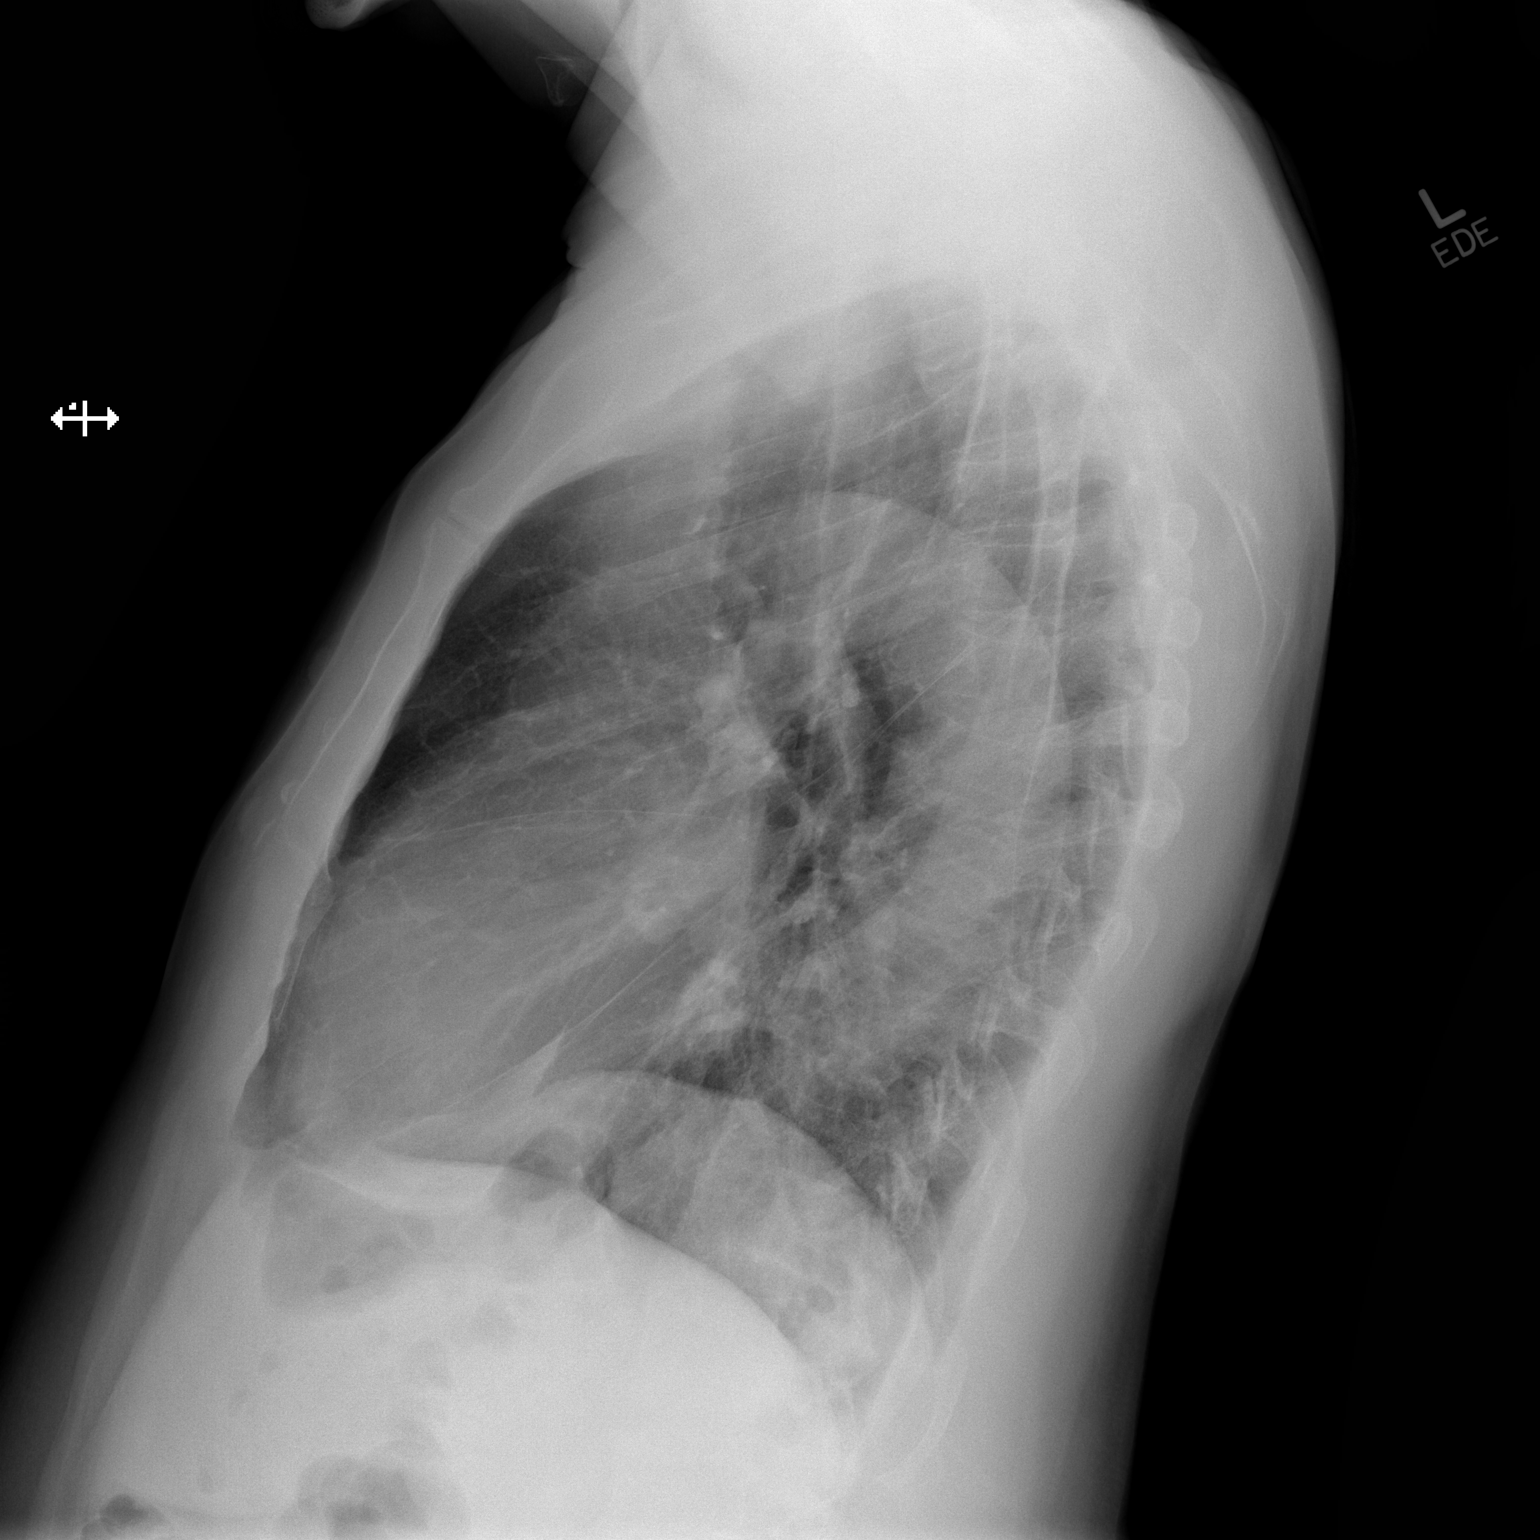

[2 of 2 positions shown; findings below may reference images not displayed]

FINDINGS: Chronically tortuous thoracic aorta. Stable cardiac size and
mediastinal contours. Visualized tracheal air column is within
normal limits. No pneumothorax, pulmonary edema, pleural effusion or
confluent pulmonary opacity. No acute osseous abnormality
identified.
IMPRESSION: No acute cardiopulmonary abnormality.

## 2016-07-21 IMAGING — CR DG CHEST 1V PORT
1 series · 1 of 1 positions shown · non-contrast
Comparison: PA and lateral chest x-ray April 07, 2014

CLINICAL DATA: Postoperative from aortic valve repair.

EXAM:
PORTABLE CHEST - 1 VIEW

[AP]
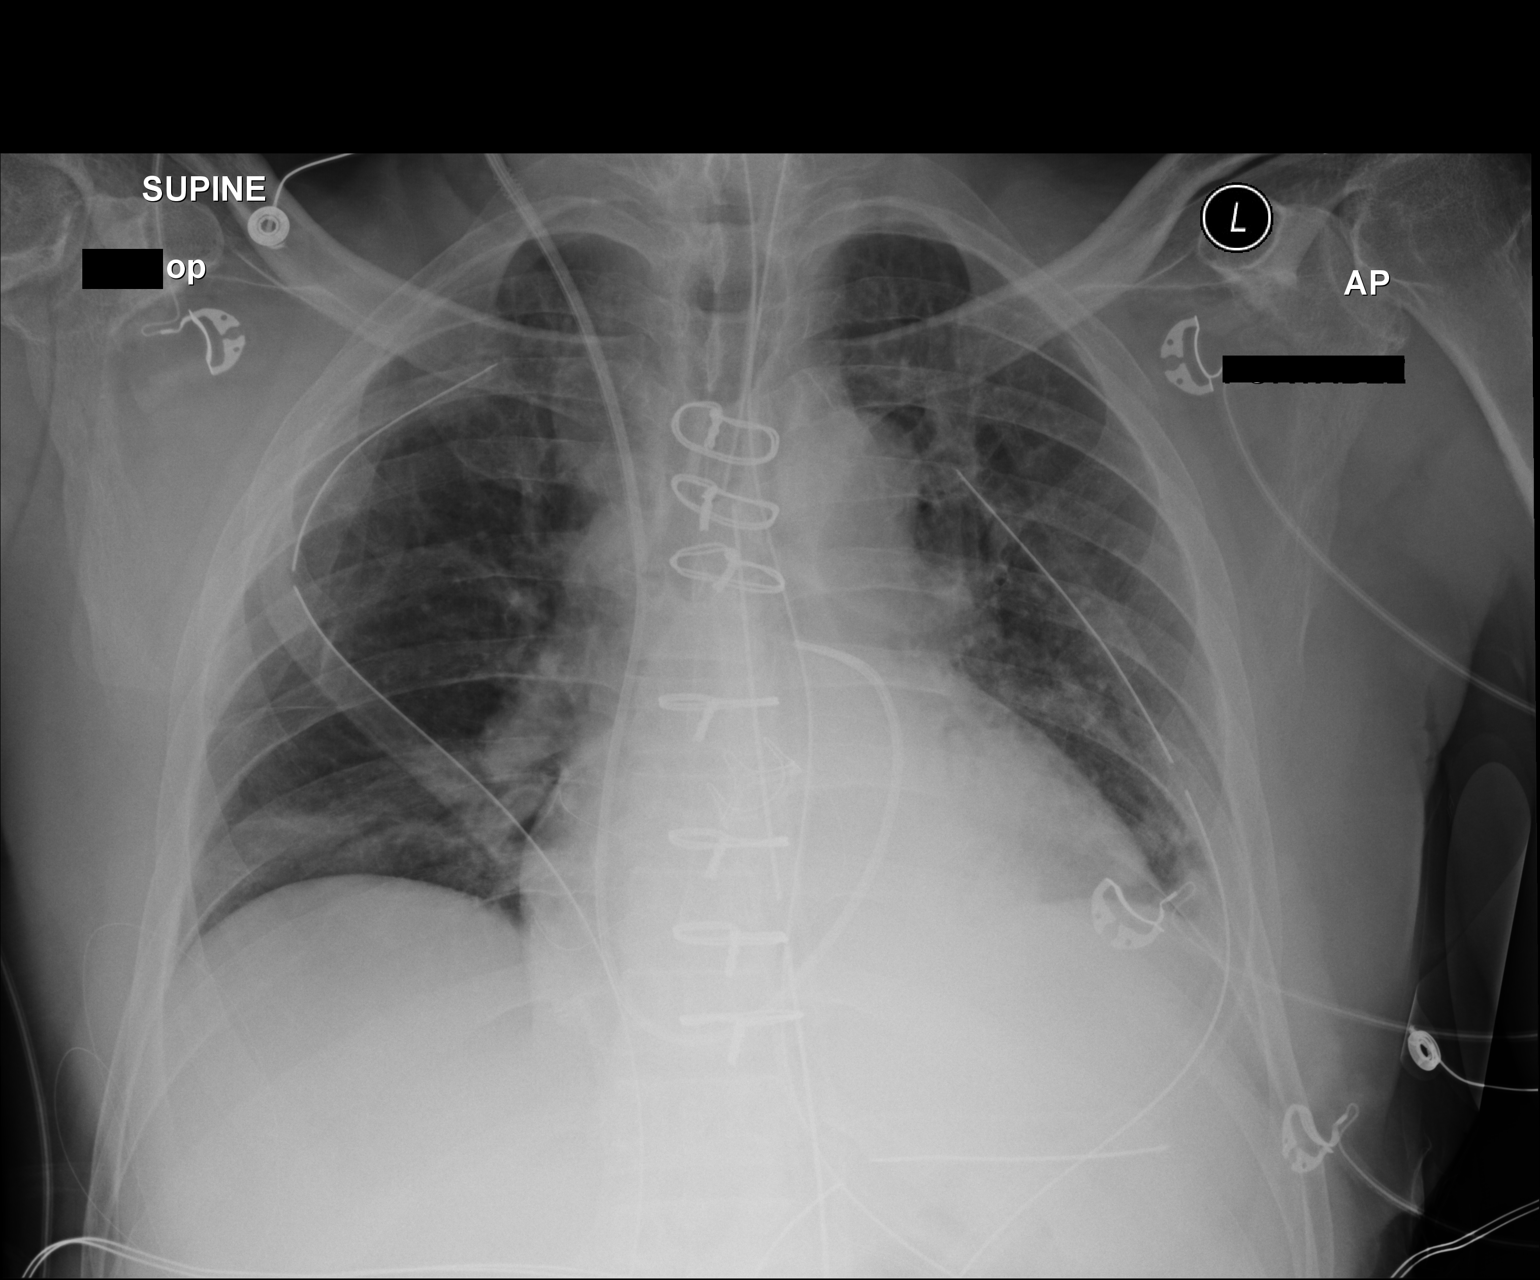

[1 of 1 positions shown; findings below may reference images not displayed]

FINDINGS: The lungs are less well inflated today. There are chest tubes
bilaterally. On the right a chest tube is present with the tip
projecting over the posterior aspect of the fourth rib. On the left
there are 3 chest tubes though the most medial [DATE] reflect a
mediastinal drain. The uppermost chest tube tip projects over the
posterior aspect of the fifth rib. The lower most chest tube
projects over in the region of the posterior costophrenic gutter.
There is no pneumothorax nor significant pleural effusion. The
cardiac silhouette is mildly enlarged. The pulmonary vascularity is
indistinct centrally with mild cephalization noted.

The endotracheal tube tip projects 3 cm above the carina. The
esophagogastric tube tip projects below the inferior margin of the
image. The Swan-Ganz catheter tip projects in the region of the
proximal right main pulmonary artery.
IMPRESSION: Postsurgical changes as described. Low-grade pulmonary edema is
present. There is no pneumothorax or pleural effusion. The support
tubes and lines are in appropriate position radiographically.

## 2016-08-20 IMAGING — CR DG CHEST 2V
2 series · 2 of 2 positions shown · non-contrast
Comparison: Chest CT scan April 24, 2014 and portable chest
x-ray April 13, 2014.

CLINICAL DATA: Status post aortic valve replacement on April 11, 2014, pulmonary embolism on April 24, 2014. No current symptoms.

EXAM:
CHEST  2 VIEW

[view not recorded (1 of 2)]
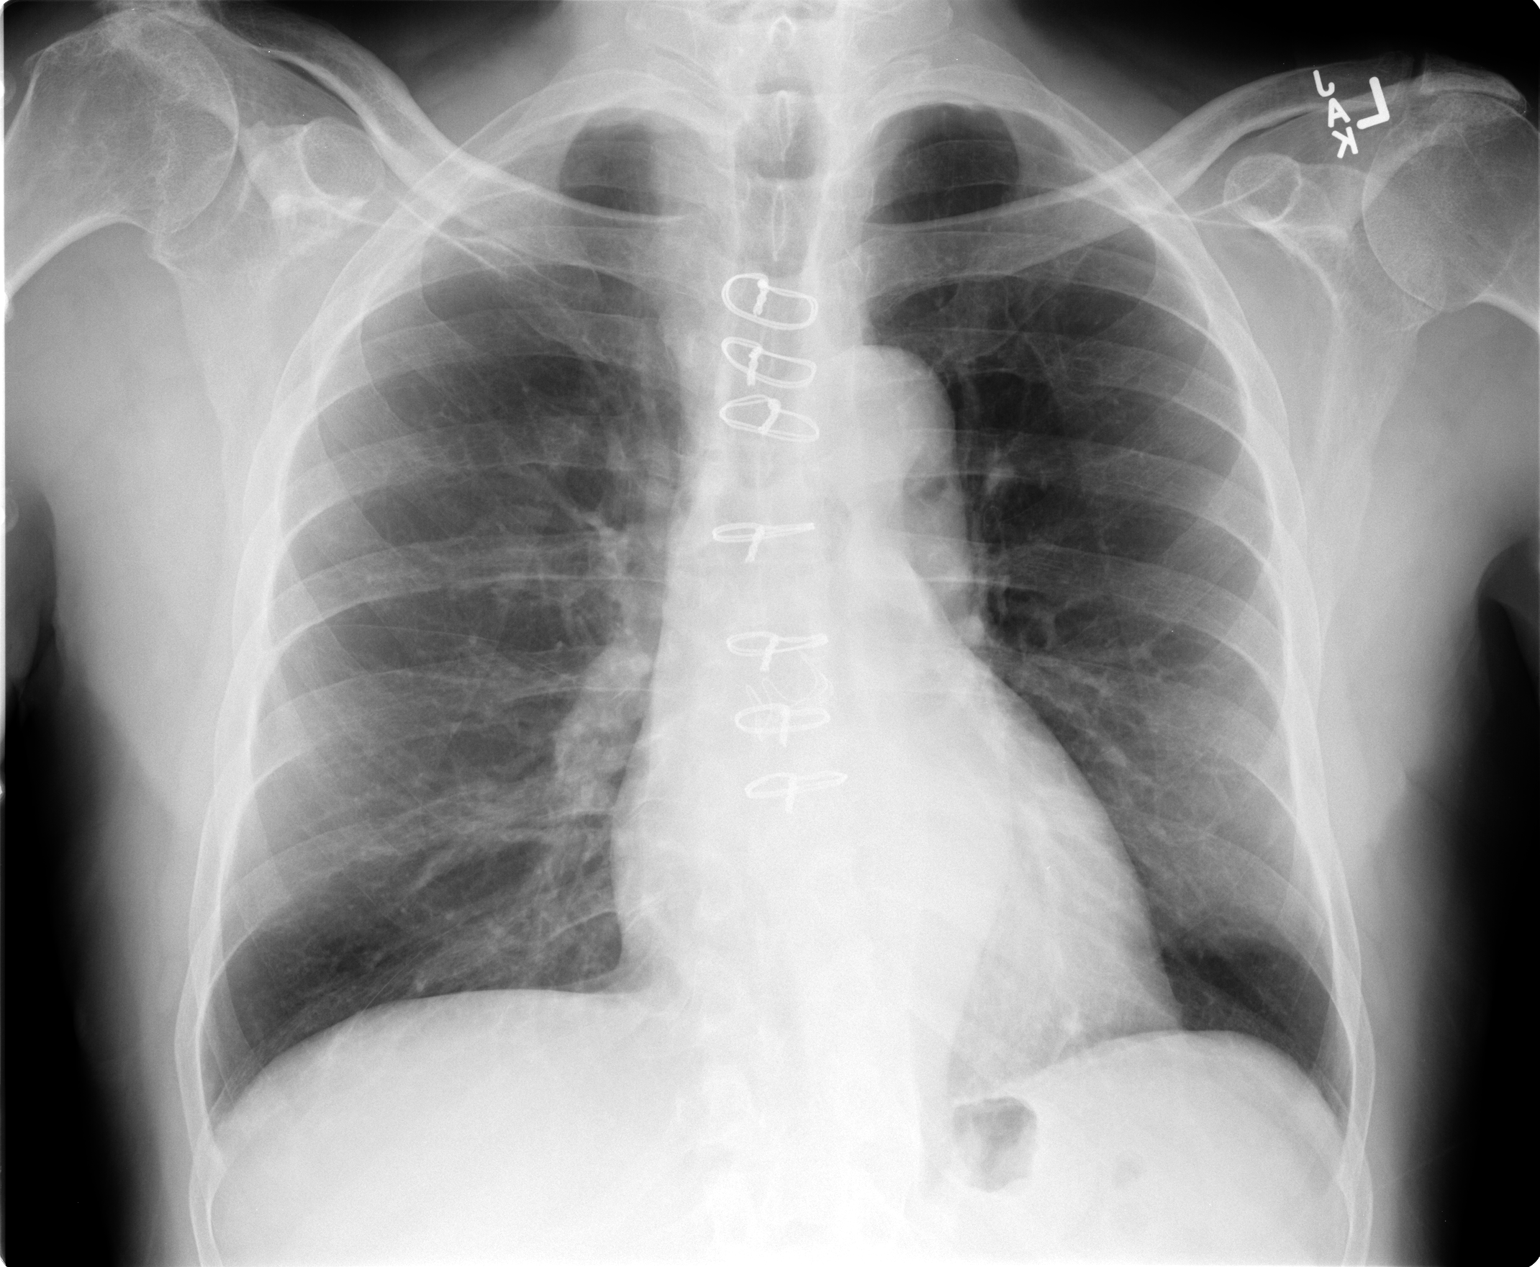

[view not recorded (2 of 2)]
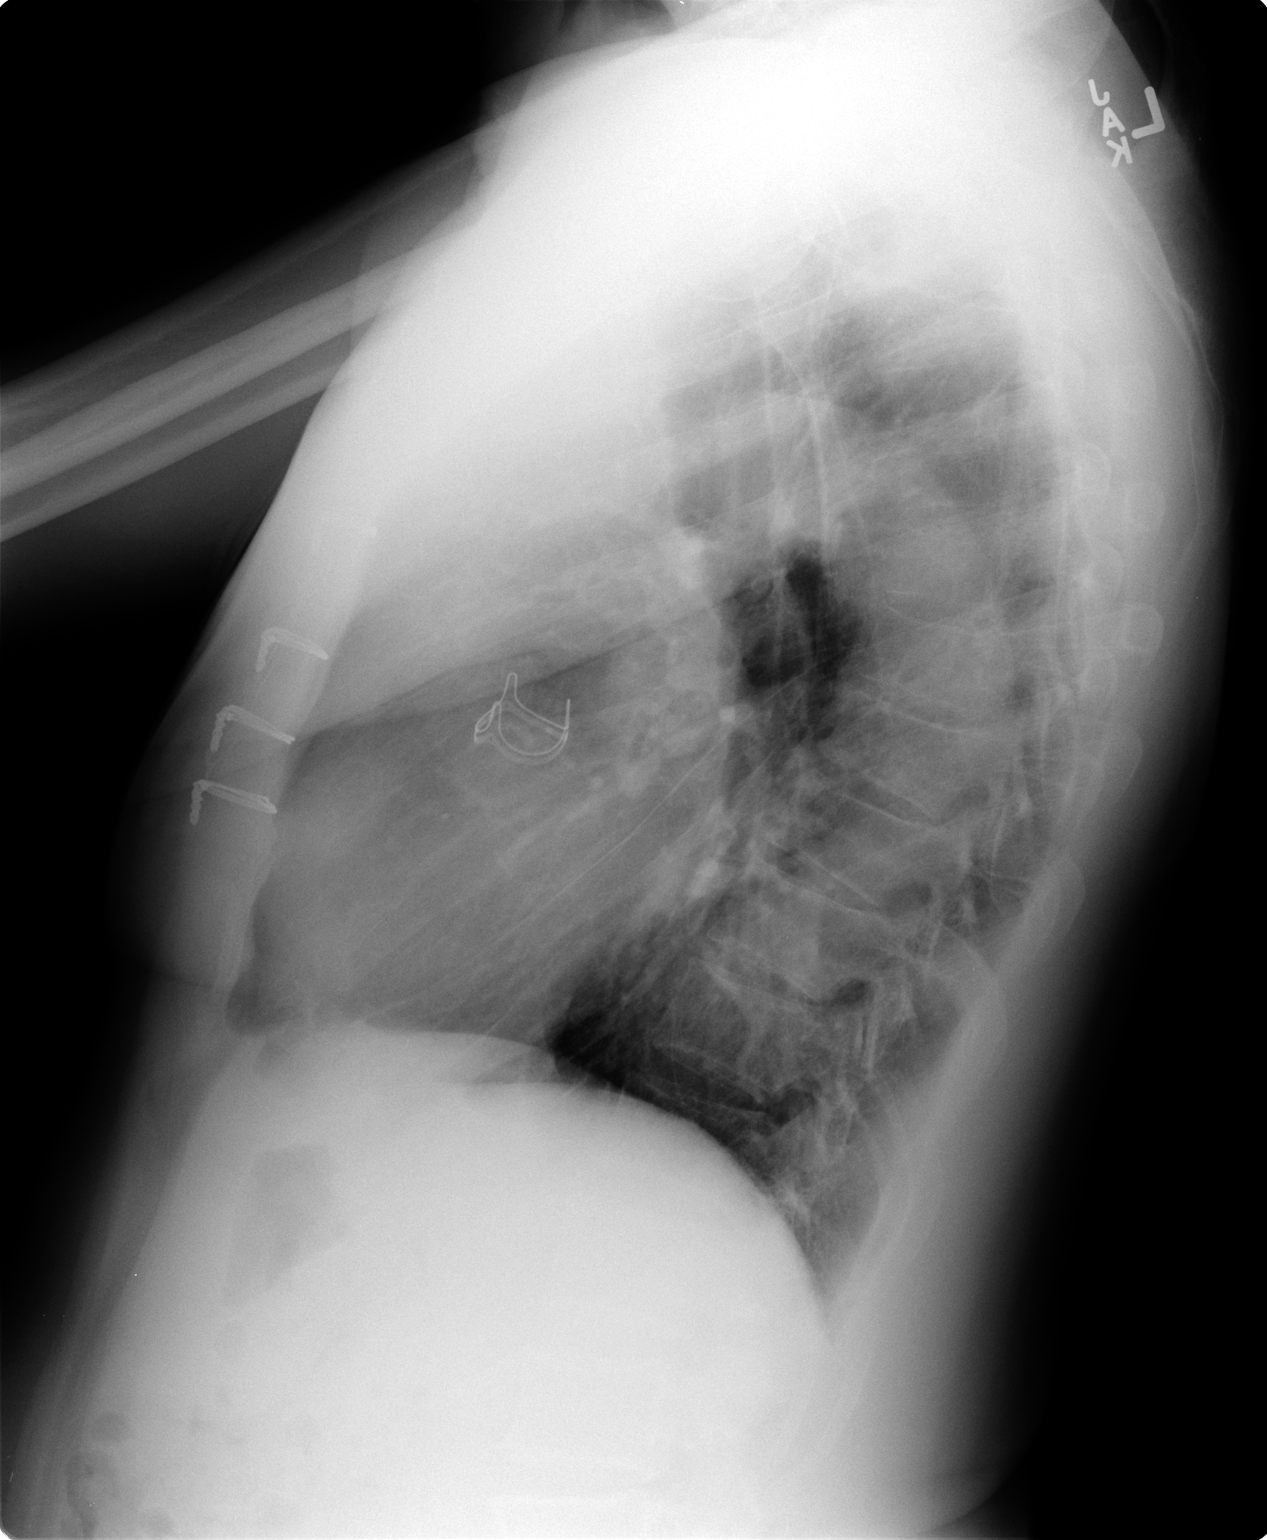

[2 of 2 positions shown; findings below may reference images not displayed]

FINDINGS: The lungs are well-expanded. There is no focal infiltrate. There is
no pleural effusion. The cardiac silhouette and pulmonary
vascularity are normal. A prosthetic aortic valve ring is present.
There are 7 intact sternal wires. There is mild stable tortuosity of
the descending thoracic aorta. The bony thorax exhibits no acute
abnormality. There degenerative changes of both shoulders.
IMPRESSION: There is no evidence of CHF nor other acute cardiopulmonary
abnormality.

## 2016-08-27 DIAGNOSIS — I352 Nonrheumatic aortic (valve) stenosis with insufficiency: Secondary | ICD-10-CM | POA: Diagnosis not present

## 2016-08-27 DIAGNOSIS — I10 Essential (primary) hypertension: Secondary | ICD-10-CM | POA: Diagnosis not present

## 2016-08-27 DIAGNOSIS — D171 Benign lipomatous neoplasm of skin and subcutaneous tissue of trunk: Secondary | ICD-10-CM | POA: Diagnosis not present

## 2016-08-27 DIAGNOSIS — E7800 Pure hypercholesterolemia, unspecified: Secondary | ICD-10-CM | POA: Diagnosis not present

## 2016-09-17 ENCOUNTER — Other Ambulatory Visit: Payer: Self-pay | Admitting: Physician Assistant

## 2016-09-17 DIAGNOSIS — Z952 Presence of prosthetic heart valve: Secondary | ICD-10-CM

## 2016-09-17 DIAGNOSIS — I10 Essential (primary) hypertension: Secondary | ICD-10-CM

## 2016-09-23 DIAGNOSIS — J069 Acute upper respiratory infection, unspecified: Secondary | ICD-10-CM | POA: Diagnosis not present

## 2016-10-22 ENCOUNTER — Other Ambulatory Visit: Payer: Self-pay | Admitting: Interventional Cardiology

## 2016-12-01 ENCOUNTER — Other Ambulatory Visit: Payer: Self-pay | Admitting: Interventional Cardiology

## 2016-12-15 ENCOUNTER — Other Ambulatory Visit: Payer: Self-pay | Admitting: Interventional Cardiology

## 2016-12-15 DIAGNOSIS — Z952 Presence of prosthetic heart valve: Secondary | ICD-10-CM

## 2016-12-15 DIAGNOSIS — I10 Essential (primary) hypertension: Secondary | ICD-10-CM

## 2016-12-16 ENCOUNTER — Other Ambulatory Visit: Payer: Self-pay | Admitting: Interventional Cardiology

## 2016-12-22 ENCOUNTER — Other Ambulatory Visit: Payer: Self-pay | Admitting: Interventional Cardiology

## 2016-12-27 ENCOUNTER — Encounter: Payer: Self-pay | Admitting: Physician Assistant

## 2016-12-27 ENCOUNTER — Ambulatory Visit (INDEPENDENT_AMBULATORY_CARE_PROVIDER_SITE_OTHER): Payer: Medicare HMO | Admitting: Physician Assistant

## 2016-12-27 VITALS — BP 138/90 | HR 80 | Ht 66.0 in | Wt 200.0 lb

## 2016-12-27 DIAGNOSIS — I498 Other specified cardiac arrhythmias: Secondary | ICD-10-CM | POA: Diagnosis not present

## 2016-12-27 DIAGNOSIS — I2510 Atherosclerotic heart disease of native coronary artery without angina pectoris: Secondary | ICD-10-CM | POA: Diagnosis not present

## 2016-12-27 DIAGNOSIS — I10 Essential (primary) hypertension: Secondary | ICD-10-CM | POA: Diagnosis not present

## 2016-12-27 DIAGNOSIS — I428 Other cardiomyopathies: Secondary | ICD-10-CM

## 2016-12-27 DIAGNOSIS — Z952 Presence of prosthetic heart valve: Secondary | ICD-10-CM | POA: Diagnosis not present

## 2016-12-27 NOTE — Patient Instructions (Signed)
Medication Instructions:  No changes - continue current medications.   Labwork: None   Testing/Procedures: None   Follow-Up: Dr. Daneen Schick or Richardson Dopp, PA-C in 1 year.   Any Other Special Instructions Will Be Listed Below (If Applicable). If you have any dizziness, call us. Check your blood pressure at the pharmacy a few times in the next 2 weeks.  Call if your blood pressure is running 140/90 (either number) or higher each time.  If you need a refill on your cardiac medications before your next appointment, please call your pharmacy.

## 2016-12-27 NOTE — Progress Notes (Signed)
Cardiology Office Note:    Date:  12/27/2016   ID:  Donata Clay, DOB 04/05/1940, MRN [id number]  PCP:  Aurea Graff.Marlou Sa, MD  Cardiologist:  Dr. Daneen Schick    Referring MD: Alroy Dust, Carlean Jews.Marlou Sa, MD   Chief Complaint  Patient presents with  . Follow-up    Status post AVR    History of Present Illness:    Frederick Burnett is a 76 y.o. male with a hx of HTN, HL, dilated cardiomyopathy, nonobstructive coronary artery disease by cardiac catheterization 12/15, aortic insufficiency, status post bioprosthetic aortic valve replacement in February 2016.  Postoperative course was complicated by left leg DVT with associated bilateral pulmonary emboli treated with Xarelto x 6 months.  Last echo in 3/17 demonstrated EF 40-45% with a well functioning bioprosthetic aortic valve prosthesis.  Last seen by Dr. Tamala Julian in 10/2015.  Mr. Isa returns for follow-up.  He is here alone.  Overall, he has been doing well.  He denies any chest discomfort or significant shortness of breath.  He denies paroxysmal nocturnal dyspnea, edema.  He denies any dizziness, near-syncope or syncope.  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  He also does his own gardening at home.  Prior CV studies:   The following studies were reviewed today:  Echo 05/23/15 Mild LVH, EF 40-45%, anteroseptal akinesis, bioprosthetic AVR (mean 9 mmHg), mild LAE  Echo 05/26/14 Mild concentric LVH, EF 40-45%, anteroseptal akinesis, aortic valve prosthesis functioning normally (mean gradient 19 mmHg), mild LAE  Carotid US 04/07/14 Bilat 1-39% ICA  Transesophageal echocardiogram 03/24/14 Mild concentric LVH, EF 35-40%, diffuse HK, severe AI, descending aorta mildly dilated at 29 mm  Cardiac cath 02/22/14 LM: widely patent. LAD: widely patent but reveals proximal and mid luminal irregularities. LCx: widely patent and contains 50% mid vessel stenosis after the first obtuse marginal branch.  RCA: Patent with proximal and mid  irregularities.  Severe aortic regurgitation with aortic root enlargement EF of 35-40%. Moderate mitral regurgitation is noted.  Past Medical History:  Diagnosis Date  . Aortic regurgitation   . Aortic regurgitation   . Aortic root enlargement ([place])   . Arthritis    both shoulders - RC- wear  . Heart murmur   . History of pulmonary embolism 04/24/2014   Provoked; occurred after AVR in 2016  . HTN (hypertension)   . Hx of echocardiogram    Echo 3/16: mild LVH, EF 40-45%, ant-septal AK, AVR with elevated velocity (3.3 m/s - slightly above expected value), mild LAE //  Echo 3/17: Mild LVH, EF 40%, diffuse HK, indeterminate diastolic function, bioprosthetic AVR well-functioning (mean gradient 9 mmHg), MAC, mild LAE, mildly reduced RVSF, mild RAE  . Hypercholesteremia     Past Surgical History:  Procedure Laterality Date  . AORTIC VALVE REPLACEMENT N/A 04/11/2014   Procedure: AORTIC VALVE REPLACEMENT (AVR);  Surgeon: Gaye Pollack, MD;  Location: [facility];  Service: Open Heart Surgery;  Laterality: N/A;  . CARDIAC CATHETERIZATION    . LEFT AND RIGHT HEART CATHETERIZATION WITH CORONARY ANGIOGRAM N/A 02/22/2014   Procedure: LEFT AND RIGHT HEART CATHETERIZATION WITH CORONARY ANGIOGRAM;  Surgeon: Sinclair Grooms, MD;  Location: [facility] CATH LAB;  Service: Cardiovascular;  Laterality: N/A;  . MULTIPLE TOOTH EXTRACTIONS    . TEE WITHOUT CARDIOVERSION N/A 03/24/2014   Procedure: TRANSESOPHAGEAL ECHOCARDIOGRAM (TEE);  Surgeon: Dorothy Spark, MD;  Location: [facility];  Service: Cardiovascular;  Laterality: N/A;  . TEE WITHOUT CARDIOVERSION N/A 04/11/2014   Procedure: TRANSESOPHAGEAL  ECHOCARDIOGRAM (TEE);  Surgeon: Gaye Pollack, MD;  Location: [facility];  Service: Open Heart Surgery;  Laterality: N/A;    Current Medications: Current Meds  Medication Sig  . aspirin EC 81 MG EC tablet Take 1 tablet (81 mg total) by mouth daily.  Marland Kitchen atorvastatin (LIPITOR) 20 MG tablet Take 1 tablet (20 mg total) by  mouth daily. Please make an overdue appt with Dr. Tamala Julian before anymore refills.  Marland Kitchen lisinopril-hydrochlorothiazide (PRINZIDE,ZESTORETIC) 20-25 MG tablet Take 1 tablet by mouth daily. Please make an appt with Dr. Tamala Julian before anymore refills. Thanks  . metoprolol tartrate (LOPRESSOR) 25 MG tablet Take 1 tablet (25 mg total) by mouth 2 (two) times daily.  . Multiple Vitamin (MULTIVITAMIN) capsule Take 1 capsule by mouth daily.     Allergies:   Patient has no known allergies.   Social History  Substance Use Topics  . Smoking status: Former Smoker    Types: Cigars    Quit date: 05/29/2001  . Smokeless tobacco: Never Used  . Alcohol use No     Family Hx: The patient's family history includes Heart attack in his father; Hypertension in his brother, daughter, mother, and sister. There is no history of Stroke.  ROS:   Please see the history of present illness.    Review of Systems  Constitution: Positive for chills.   All other systems reviewed and are negative.   EKGs/Labs/Other Test Reviewed:    EKG:  EKG is  ordered today.  The ekg ordered today demonstrates ? Accelerated junctional rhythm versus sinus rhythm with a very long first-degree AV block (512 ms), leftward axis, QTC 433 ms  Recent Labs: No results found for requested labs within last 8760 hours.   Recent Lipid Panel Lab Results  Component Value Date/Time   CHOL 127 06/07/2014 10:18 AM   TRIG 81.0 06/07/2014 10:18 AM   HDL 49.60 06/07/2014 10:18 AM   CHOLHDL 3 06/07/2014 10:18 AM   LDLCALC 61 06/07/2014 10:18 AM    Physical Exam:    VS:  BP 138/90   Pulse 80   Ht 5' 6"  (1.676 m)   Wt 200 lb (90.7 kg)   SpO2 98%   BMI 32.28 kg/m     Wt Readings from Last 3 Encounters:  12/27/16 200 lb (90.7 kg)  11/17/15 201 lb 12.8 oz (91.5 kg)  07/10/15 202 lb 6.4 oz (91.8 kg)     Physical Exam  Constitutional: He is oriented to person, place, and time. He appears well-developed and well-nourished. No distress.  HENT:    Head: Normocephalic and atraumatic.  Neck: No JVD present.  Cardiovascular: Normal rate and regular rhythm.   Murmur heard.  Systolic murmur is present with a grade of 2/6  at the upper right sternal border Pulmonary/Chest: Effort normal. He has no rales.  Abdominal: Soft.  Musculoskeletal: He exhibits no edema.  Neurological: He is alert and oriented to person, place, and time.  Skin: Skin is warm and dry.    ASSESSMENT:    1. S/P AVR   2. NICM (nonischemic cardiomyopathy) ([place])   3. Essential hypertension   4. Coronary artery disease involving native coronary artery of native heart without angina pectoris   5. Accelerated junctional rhythm    PLAN:    In order of problems listed above:  1.  S/P AVR Doing well without shortness of breath.  Echocardiogram in March 2017 with well functioning aortic valve prosthesis.  Continue SBE prophylaxis.  2. NICM (nonischemic cardiomyopathy) (  [place]) EF 40-45 by last echocardiogram.  He is not demonstrating any signs of volume excess.  He is New York Heart Association class II.  Continue beta-blocker, ACE inhibitor.  3.  Essential hypertension Blood pressure somewhat elevated today.  He admits to increased salt in his diet recently.  I have asked him to monitor his blood pressure and call if it remains elevated.  I have also asked him to limit his salt.  4.  Coronary artery disease involving native coronary artery of native heart without angina pectoris No obstructive CAD at the time of his cardiac catheterization prior to aortic valve surgery.  He is not having angina.  Continue aspirin, statin.  5.  Accelerated junctional rhythm It is difficult to tell if this is a accelerated junctional rhythm versus sinus rhythm with a very long first-degree AV block.  His rhythm remains the same since his aortic valve surgery.  He is not symptomatic with this.  We discussed whether or not to proceed with a 24-hour Holter monitor.  He prefers to hold off  for now.  I have asked him to contact us if if he develops any dizziness.  At that point, I would recommend a 24-hour Holter.   Dispo:  Return in about 1 year (around 12/27/2017) for Routine Follow Up with Dr. Tamala Julian.   Medication Adjustments/Labs and Tests Ordered: Current medicines are reviewed at length with the patient today.  Concerns regarding medicines are outlined above.  Tests Ordered: Orders Placed This Encounter  Procedures  . EKG 12-Lead   Medication Changes: No orders of the defined types were placed in this encounter.   Signed, Richardson Dopp, PA-C  12/27/2016 12:06 PM    [facility] [place], [place], [place]  [address] Phone: [phone]; Fax: [phone]

## 2017-01-04 ENCOUNTER — Other Ambulatory Visit: Payer: Self-pay | Admitting: Interventional Cardiology

## 2017-01-04 DIAGNOSIS — I10 Essential (primary) hypertension: Secondary | ICD-10-CM

## 2017-01-04 DIAGNOSIS — Z952 Presence of prosthetic heart valve: Secondary | ICD-10-CM

## 2017-02-02 ENCOUNTER — Other Ambulatory Visit: Payer: Self-pay | Admitting: Interventional Cardiology

## 2017-02-26 ENCOUNTER — Emergency Department (HOSPITAL_COMMUNITY)
Admission: EM | Admit: 2017-02-26 | Discharge: 2017-02-27 | Disposition: A | Discharge status: Home / Self Care | Payer: Medicare HMO | Attending: Emergency Medicine | Admitting: Emergency Medicine

## 2017-02-26 ENCOUNTER — Encounter (HOSPITAL_COMMUNITY): Payer: Self-pay

## 2017-02-26 ENCOUNTER — Other Ambulatory Visit: Payer: Self-pay

## 2017-02-26 DIAGNOSIS — Z79899 Other long term (current) drug therapy: Secondary | ICD-10-CM | POA: Insufficient documentation

## 2017-02-26 DIAGNOSIS — I10 Essential (primary) hypertension: Secondary | ICD-10-CM | POA: Diagnosis not present

## 2017-02-26 DIAGNOSIS — R9431 Abnormal electrocardiogram [ECG] [EKG]: Secondary | ICD-10-CM | POA: Diagnosis not present

## 2017-02-26 DIAGNOSIS — R42 Dizziness and giddiness: Secondary | ICD-10-CM | POA: Diagnosis not present

## 2017-02-26 DIAGNOSIS — Z87891 Personal history of nicotine dependence: Secondary | ICD-10-CM | POA: Insufficient documentation

## 2017-02-26 DIAGNOSIS — Z7982 Long term (current) use of aspirin: Secondary | ICD-10-CM | POA: Insufficient documentation

## 2017-02-26 LAB — CBC
HCT: 41.4 % (ref 39.0–52.0)
Hemoglobin: 13.4 g/dL (ref 13.0–17.0)
MCH: 27.7 pg (ref 26.0–34.0)
MCHC: 32.4 g/dL (ref 30.0–36.0)
MCV: 85.5 fL (ref 78.0–100.0)
Platelets: 128 10*3/uL — ABNORMAL LOW (ref 150–400)
RBC: 4.84 MIL/uL (ref 4.22–5.81)
RDW: 13.6 % (ref 11.5–15.5)
WBC: 5.6 10*3/uL (ref 4.0–10.5)

## 2017-02-26 LAB — URINALYSIS, ROUTINE W REFLEX MICROSCOPIC
Bilirubin Urine: NEGATIVE
Glucose, UA: NEGATIVE mg/dL
Hgb urine dipstick: NEGATIVE
Ketones, ur: NEGATIVE mg/dL
Leukocytes, UA: NEGATIVE
Nitrite: NEGATIVE
Protein, ur: NEGATIVE mg/dL
Specific Gravity, Urine: 1.021 (ref 1.005–1.030)
pH: 6 (ref 5.0–8.0)

## 2017-02-26 LAB — BASIC METABOLIC PANEL
Anion gap: 9 (ref 5–15)
BUN: 12 mg/dL (ref 6–20)
CHLORIDE: 105 mmol/L (ref 101–111)
CO2: 23 mmol/L (ref 22–32)
CREATININE: 1.13 mg/dL (ref 0.61–1.24)
Calcium: 9.1 mg/dL (ref 8.9–10.3)
GFR calc Af Amer: >60 mL/min (ref >60)
GFR calc non Af Amer: >60 mL/min (ref >60)
Glucose, Bld: 108 mg/dL — ABNORMAL HIGH (ref 65–99)
POTASSIUM: 3.5 mmol/L (ref 3.5–5.1)
SODIUM: 137 mmol/L (ref 135–145)

## 2017-02-26 LAB — CBG MONITORING, ED: Glucose-Capillary: 103 mg/dL — ABNORMAL HIGH (ref 65–99)

## 2017-02-26 NOTE — ED Triage Notes (Signed)
Pt from POV c.o dizziness that started around noon today. Pt states it gets worse when he stands up. Denies N/V. Endorses left rib cage pain 2/10. No neuro deficits noted in triage. Pt a.o, nad.

## 2017-02-26 NOTE — ED Provider Notes (Signed)
TIME SEEN: 11:17 PM  CHIEF COMPLAINT: Lightheadedness  HPI: Patient is a 77 year old male with history of hypertension, hyperlipidemia, aortic valve replacement with a bioprosthetic valve after he had severe aortic regurgitation who presents today to the emergency department with lightheadedness.  Had several episodes with standing today but has now resolved.  He denies any vertigo.  No chest pain or chest discomfort, shortness of breath, nausea, vomiting, diarrhea.  No fever or cough.  No numbness, tingling or focal weakness.  He states he is feeling much better and "ready to go home".  ROS: See HPI Constitutional: no fever  Eyes: no drainage  ENT: no runny nose   Cardiovascular:  no chest pain  Resp: no SOB  GI: no vomiting GU: no dysuria Integumentary: no rash  Allergy: no hives  Musculoskeletal: no leg swelling  Neurological: no slurred speech ROS otherwise negative  PAST MEDICAL HISTORY/PAST SURGICAL HISTORY:  Past Medical History:  Diagnosis Date  . Aortic regurgitation   . Aortic regurgitation   . Aortic root enlargement ([place])   . Arthritis    both shoulders - RC- wear  . Heart murmur   . History of pulmonary embolism 04/24/2014   Provoked; occurred after AVR in 2016  . HTN (hypertension)   . Hx of echocardiogram    Echo 3/16: mild LVH, EF 40-45%, ant-septal AK, AVR with elevated velocity (3.3 m/s - slightly above expected value), mild LAE //  Echo 3/17: Mild LVH, EF 40%, diffuse HK, indeterminate diastolic function, bioprosthetic AVR well-functioning (mean gradient 9 mmHg), MAC, mild LAE, mildly reduced RVSF, mild RAE  . Hypercholesteremia     MEDICATIONS:  Prior to Admission medications   Medication Sig Start Date End Date Taking? Authorizing Provider  aspirin EC 81 MG EC tablet Take 1 tablet (81 mg total) by mouth daily. Patient taking differently: Take 81 mg by mouth at bedtime.  04/26/14  Yes Barton Dubois, MD  atorvastatin (LIPITOR) 20 MG tablet Take 1 tablet (20  mg total) by mouth daily. Please make an overdue appt with Dr. Tamala Julian before anymore refills. Patient taking differently: Take 20 mg by mouth at bedtime.  12/24/16  Yes Belva Crome, MD  lisinopril-hydrochlorothiazide (PRINZIDE,ZESTORETIC) 20-25 MG tablet Take 1 tablet daily by mouth. Patient taking differently: Take 0.5 tablets by mouth daily.  01/06/17  Yes Belva Crome, MD  metoprolol tartrate (LOPRESSOR) 25 MG tablet Take 1 tablet (25 mg total) by mouth 2 (two) times daily. 04/15/14  Yes Barrett, Erin R, PA-C  Multiple Vitamin (MULTIVITAMIN) capsule Take 1 capsule by mouth daily.   Yes [provider]  atorvastatin (LIPITOR) 20 MG tablet TAKE 1 TABLET EVERY DAY Patient not taking: Reported on 02/26/2017 02/04/17   Belva Crome, MD    ALLERGIES:  No Known Allergies  SOCIAL HISTORY:  Social History   Tobacco Use  . Smoking status: Former Smoker    Types: Cigars    Last attempt to quit: 05/29/2001    Years since quitting: 15.7  . Smokeless tobacco: Never Used  Substance Use Topics  . Alcohol use: No    Alcohol/week: 0.0 oz    FAMILY HISTORY: Family History  Problem Relation Age of Onset  . Hypertension Mother   . Heart attack Father   . Hypertension Daughter   . Hypertension Sister   . Hypertension Brother   . Stroke Neg Hx     EXAM: BP (!) 158/103 (BP Location: Right Arm)   Pulse 78   Temp 97.9  F (36.6 C)   Resp 16   Ht _0  (1.753 m)   Wt 94.3 kg (208 lb)   SpO2 100%   BMI 30.72 kg/m  CONSTITUTIONAL: Alert and oriented and responds appropriately to questions. Well-appearing; well-nourished HEAD: Normocephalic EYES: Conjunctivae clear, pupils appear equal, EOMI ENT: normal nose; moist mucous membranes; No pharyngeal erythema or petechiae, no tonsillar hypertrophy or exudate, no uvular deviation, no unilateral swelling, no trismus or drooling, no muffled voice, normal phonation, no stridor, no dental caries present, no drainable dental abscess noted, no  Ludwig's angina, tongue sits flat in the bottom of the mouth, no angioedema, no facial erythema or warmth, no facial swelling; no pain with movement of the neck. NECK: Supple, no meningismus, no nuchal rigidity, no LAD  CARD: RRR; S1 and S2 appreciated; + murmur, no clicks, no rubs, no gallops RESP: Normal chest excursion without splinting or tachypnea; breath sounds clear and equal bilaterally; no wheezes, no rhonchi, no rales, no hypoxia or respiratory distress, speaking full sentences ABD/GI: Normal bowel sounds; non-distended; soft, non-tender, no rebound, no guarding, no peritoneal signs, no hepatosplenomegaly BACK:  The back appears normal and is non-tender to palpation, there is no CVA tenderness EXT: Normal ROM in all joints; non-tender to palpation; no edema; normal capillary refill; no cyanosis, no calf tenderness or swelling    SKIN: Normal color for age and race; warm; no rash NEURO: Moves all extremities equally, strength 5/5 in all 4 extremities, cranial nerves II through XII intact, sensation to light touch intact diffusely, normal gait, no ataxia, does not require assistance ambulating PSYCH: The patient's mood and manner are appropriate. Grooming and personal hygiene are appropriate.  MEDICAL DECISION MAKING: Patient here with lightheadedness that has now resolved.  Workup in the emergency department has been unremarkable.  His first EKG suggested a junctional rhythm however it was a very poor EKG and I do not think this is truly a junctional rhythm.  Repeat EKG shows sinus rhythm with first-degree AV block.  No ischemic changes or other interval abnormalities.  His hemoglobin, electrolytes are normal.  Blood glucose normal.  Urine shows no sign of infection or dehydration.  This does not sound like ACS, PE or dissection.  He does not appear volume overloaded or dehydrated.  He is requesting to go home.  He does not want any further workup at this time given he is asymptomatic.  Suspect  this could have been related to orthostasis.  Recommended when he stand up from a seated position that he does so slowly and follow-up closely with his primary care provider.  We discussed at length return precautions.  Patient and his son who is present at the bedside are both comfortable with this plan.  At this time, I do not feel there is any life-threatening condition present. I have reviewed and discussed all results (EKG, imaging, lab, urine as appropriate) and exam findings with patient/family. I have reviewed nursing notes and appropriate previous records.  I feel the patient is safe to be discharged home without further emergent workup and can continue workup as an outpatient as needed. Discussed usual and customary return precautions. Patient/family verbalize understanding and are comfortable with this plan.  Outpatient follow-up has been provided if needed. All questions have been answered.      EKG Interpretation  Date/Time:  Wednesday February 26 2017 18:23:03 EST Ventricular Rate:  82 PR Interval:    QRS Duration: 96 QT Interval:  388 QTC Calculation: 453 R Axis:  7 Text Interpretation:  Accelerated Junctional rhythm Moderate voltage criteria for LVH, may be normal variant Nonspecific T wave abnormality Abnormal ECG Confirmed by Pryor Curia [phone]) on 02/26/2017 11:17:15 PM         EKG Interpretation  Date/Time:  Wednesday February 26 2017 23:23:27 EST Ventricular Rate:  57 PR Interval:  242 QRS Duration: 98 QT Interval:  446 QTC Calculation: 434 R Axis:   9 Text Interpretation:  Sinus bradycardia with 1st degree A-V block Possible Left atrial enlargement Left ventricular hypertrophy Abnormal ECG Confirmed by Doneshia Hill, Cyril Mourning [phone]) on 02/26/2017 11:31:54 PM          Saryn Cherry, Delice Bison, DO 02/27/17 0003

## 2017-03-26 DIAGNOSIS — E7800 Pure hypercholesterolemia, unspecified: Secondary | ICD-10-CM | POA: Diagnosis not present

## 2017-03-26 DIAGNOSIS — I352 Nonrheumatic aortic (valve) stenosis with insufficiency: Secondary | ICD-10-CM | POA: Diagnosis not present

## 2017-03-26 DIAGNOSIS — D171 Benign lipomatous neoplasm of skin and subcutaneous tissue of trunk: Secondary | ICD-10-CM | POA: Diagnosis not present

## 2017-03-26 DIAGNOSIS — I10 Essential (primary) hypertension: Secondary | ICD-10-CM | POA: Diagnosis not present

## 2017-05-26 ENCOUNTER — Telehealth: Payer: Self-pay | Admitting: Interventional Cardiology

## 2017-05-26 NOTE — Telephone Encounter (Signed)
Will route to Dr. Tamala Julian for advisement.  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

## 2017-05-26 NOTE — Telephone Encounter (Signed)
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 Please call

## 2017-05-29 ENCOUNTER — Encounter: Payer: Self-pay | Admitting: *Deleted

## 2017-05-29 NOTE — Telephone Encounter (Signed)
Spoke with Dr. Tamala Julian and he said ok for letter as long as pt has had no issues.  Spoke with pt yesterday and he denied CP, SOB, syncope or other cardiac sx.  Letter completed.

## 2017-05-29 NOTE — Telephone Encounter (Signed)
Attempted to call pt and message states VM is not set up.  Will place letter at the front desk.

## 2017-05-30 ENCOUNTER — Telehealth: Payer: Self-pay | Admitting: Interventional Cardiology

## 2017-05-30 NOTE — Telephone Encounter (Signed)
Patient made aware that letter is at the front desk ready for pick up. Patient stated he will stop by today. Patient thankful for the call.

## 2017-05-30 NOTE — Telephone Encounter (Signed)
New message  Pt verbalized that he is calling for RN  To see if Dr.Smith has completed his letter for his job  So he can come and pick it up from our office

## 2017-08-27 ENCOUNTER — Other Ambulatory Visit: Payer: Self-pay | Admitting: Interventional Cardiology

## 2017-09-24 DIAGNOSIS — I10 Essential (primary) hypertension: Secondary | ICD-10-CM | POA: Diagnosis not present

## 2017-09-24 DIAGNOSIS — D171 Benign lipomatous neoplasm of skin and subcutaneous tissue of trunk: Secondary | ICD-10-CM | POA: Diagnosis not present

## 2017-09-24 DIAGNOSIS — E7800 Pure hypercholesterolemia, unspecified: Secondary | ICD-10-CM | POA: Diagnosis not present

## 2017-09-24 DIAGNOSIS — I352 Nonrheumatic aortic (valve) stenosis with insufficiency: Secondary | ICD-10-CM | POA: Diagnosis not present

## 2017-12-19 ENCOUNTER — Other Ambulatory Visit: Payer: Self-pay | Admitting: Interventional Cardiology

## 2017-12-28 NOTE — Progress Notes (Deleted)
Cardiology Office Note:    Date:  12/28/2017   ID:  Frederick Burnett, DOB 08/12/1940, MRN [id number]  PCP:  Aurea Graff.Marlou Sa, MD  Cardiologist:  Sinclair Grooms, MD   Referring MD: Aurea Graff.Marlou Sa, MD   No chief complaint on file. ***  History of Present Illness:    Frederick Burnett is a 77 y.o. male with a hx of bicuspid aortic valve with severe aortic regurgitation treated with bioprosthetic valve, essential hypertension.  Past Medical History:  Diagnosis Date  . Aortic regurgitation   . Aortic regurgitation   . Aortic root enlargement ([place])   . Arthritis    both shoulders - RC- wear  . Heart murmur   . History of pulmonary embolism 04/24/2014   Provoked; occurred after AVR in 2016  . HTN (hypertension)   . Hx of echocardiogram    Echo 3/16: mild LVH, EF 40-45%, ant-septal AK, AVR with elevated velocity (3.3 m/s - slightly above expected value), mild LAE //  Echo 3/17: Mild LVH, EF 40%, diffuse HK, indeterminate diastolic function, bioprosthetic AVR well-functioning (mean gradient 9 mmHg), MAC, mild LAE, mildly reduced RVSF, mild RAE  . Hypercholesteremia     Past Surgical History:  Procedure Laterality Date  . AORTIC VALVE REPLACEMENT N/A 04/11/2014   Procedure: AORTIC VALVE REPLACEMENT (AVR);  Surgeon: Gaye Pollack, MD;  Location: [place];  Service: Open Heart Surgery;  Laterality: N/A;  . CARDIAC CATHETERIZATION    . LEFT AND RIGHT HEART CATHETERIZATION WITH CORONARY ANGIOGRAM N/A 02/22/2014   Procedure: LEFT AND RIGHT HEART CATHETERIZATION WITH CORONARY ANGIOGRAM;  Surgeon: Sinclair Grooms, MD;  Location: [facility] CATH LAB;  Service: Cardiovascular;  Laterality: N/A;  . MULTIPLE TOOTH EXTRACTIONS    . TEE WITHOUT CARDIOVERSION N/A 03/24/2014   Procedure: TRANSESOPHAGEAL ECHOCARDIOGRAM (TEE);  Surgeon: Dorothy Spark, MD;  Location: [place];  Service: Cardiovascular;  Laterality: N/A;  . TEE WITHOUT CARDIOVERSION N/A 04/11/2014   Procedure: TRANSESOPHAGEAL  ECHOCARDIOGRAM (TEE);  Surgeon: Gaye Pollack, MD;  Location: [place];  Service: Open Heart Surgery;  Laterality: N/A;    Current Medications: No outpatient medications have been marked as taking for the 12/29/17 encounter (Appointment) with Belva Crome, MD.     Allergies:   Patient has no known allergies.   Social History   Socioeconomic History  . Marital status: Widowed    Spouse name: Not on file  . Number of children: Not on file  . Years of education: Not on file  . Highest education level: Not on file  Occupational History  . Not on file  Social Needs  . Financial resource strain: Not on file  . Food insecurity:    Worry: Not on file    Inability: Not on file  . Transportation needs:    Medical: Not on file    Non-medical: Not on file  Tobacco Use  . Smoking status: Former Smoker    Types: Cigars    Last attempt to quit: 05/29/2001    Years since quitting: 16.5  . Smokeless tobacco: Never Used  Substance and Sexual Activity  . Alcohol use: No    Alcohol/week: 0.0 standard drinks  . Drug use: No  . Sexual activity: Not on file  Lifestyle  . Physical activity:    Days per week: Not on file    Minutes per session: Not on file  . Stress: Not on file  Relationships  . Social connections:    Talks on  phone: Not on file    Gets together: Not on file    Attends religious service: Not on file    Active member of club or organization: Not on file    Attends meetings of clubs or organizations: Not on file    Relationship status: Not on file  Other Topics Concern  . Not on file  Social History Narrative  . Not on file     Family History: The patient's ***family history includes Heart attack in his father; Hypertension in his brother, daughter, mother, and sister. There is no history of Stroke.  ROS:   Please see the history of present illness.    *** All other systems reviewed and are negative.  EKGs/Labs/Other Studies Reviewed:    The following studies  were reviewed today: ***  EKG:  EKG is *** ordered today.  The ekg ordered today demonstrates ***  Recent Labs: 02/26/2017: BUN 12; Creatinine, Ser 1.13; Hemoglobin 13.4; Platelets 128; Potassium 3.5; Sodium 137  Recent Lipid Panel    Component Value Date/Time   CHOL 127 06/07/2014 1018   TRIG 81.0 06/07/2014 1018   HDL 49.60 06/07/2014 1018   CHOLHDL 3 06/07/2014 1018   VLDL 16.2 06/07/2014 1018   LDLCALC 61 06/07/2014 1018    Physical Exam:    VS:  There were no vitals taken for this visit.    Wt Readings from Last 3 Encounters:  02/26/17 208 lb (94.3 kg)  12/27/16 200 lb (90.7 kg)  11/17/15 201 lb 12.8 oz (91.5 kg)     GEN: *** Well nourished, well developed in no acute distress HEENT: Normal NECK: No JVD. LYMPHATICS: No lymphadenopathy CARDIAC: ***RRR, ***murmur, ***gallop, *** edema. VASCULAR: *** pulses. *** bruits. RESPIRATORY:  Clear to auscultation without rales, wheezing or rhonchi  ABDOMEN: Soft, non-tender, non-distended, No pulsatile mass, MUSCULOSKELETAL: No deformity  SKIN: Warm and dry NEUROLOGIC:  Alert and oriented x 3 PSYCHIATRIC:  Normal affect   ASSESSMENT:    1. S/P AVR   2. Essential hypertension   3. Coronary artery disease involving native coronary artery of native heart without angina pectoris   4. NICM (nonischemic cardiomyopathy) ([place])    PLAN:    In order of problems listed above:  1. ***   Medication Adjustments/Labs and Tests Ordered: Current medicines are reviewed at length with the patient today.  Concerns regarding medicines are outlined above.  No orders of the defined types were placed in this encounter.  No orders of the defined types were placed in this encounter.   There are no Patient Instructions on file for this visit.   Signed, Sinclair Grooms, MD  12/28/2017 8:25 PM    [facility]

## 2017-12-29 ENCOUNTER — Ambulatory Visit: Payer: Medicare HMO | Admitting: Interventional Cardiology

## 2018-01-16 ENCOUNTER — Other Ambulatory Visit: Payer: Self-pay | Admitting: Interventional Cardiology

## 2018-02-22 ENCOUNTER — Other Ambulatory Visit: Payer: Self-pay

## 2018-02-22 ENCOUNTER — Emergency Department (HOSPITAL_COMMUNITY)
Admission: EM | Admit: 2018-02-22 | Discharge: 2018-02-22 | Disposition: A | Discharge status: Home / Self Care | Payer: Medicare HMO | Attending: Emergency Medicine | Admitting: Emergency Medicine

## 2018-02-22 ENCOUNTER — Emergency Department (HOSPITAL_COMMUNITY): Payer: Medicare HMO

## 2018-02-22 DIAGNOSIS — Z7982 Long term (current) use of aspirin: Secondary | ICD-10-CM | POA: Insufficient documentation

## 2018-02-22 DIAGNOSIS — Z79899 Other long term (current) drug therapy: Secondary | ICD-10-CM | POA: Diagnosis not present

## 2018-02-22 DIAGNOSIS — J040 Acute laryngitis: Secondary | ICD-10-CM | POA: Diagnosis not present

## 2018-02-22 DIAGNOSIS — I2510 Atherosclerotic heart disease of native coronary artery without angina pectoris: Secondary | ICD-10-CM | POA: Diagnosis not present

## 2018-02-22 DIAGNOSIS — R0989 Other specified symptoms and signs involving the circulatory and respiratory systems: Secondary | ICD-10-CM | POA: Diagnosis not present

## 2018-02-22 DIAGNOSIS — Z87891 Personal history of nicotine dependence: Secondary | ICD-10-CM | POA: Diagnosis not present

## 2018-02-22 DIAGNOSIS — E7800 Pure hypercholesterolemia, unspecified: Secondary | ICD-10-CM | POA: Insufficient documentation

## 2018-02-22 DIAGNOSIS — I10 Essential (primary) hypertension: Secondary | ICD-10-CM | POA: Insufficient documentation

## 2018-02-22 DIAGNOSIS — Z86718 Personal history of other venous thrombosis and embolism: Secondary | ICD-10-CM | POA: Insufficient documentation

## 2018-02-22 DIAGNOSIS — R490 Dysphonia: Secondary | ICD-10-CM | POA: Diagnosis present

## 2018-02-22 NOTE — ED Provider Notes (Signed)
[facility] EMERGENCY DEPARTMENT Provider Note   CSN: [id number] Arrival date & time: 02/22/18  0551     History   Chief Complaint Chief Complaint  Patient presents with  . URI    HPI Frederick Burnett is a 77 y.o. male with past medical history of hypertension, CAD, PE/DVT, nonischemic cardiomyopathy, presenting to the emergency department with complaint of hoarse voice after resolving cold that began on Friday.  Patient states he had cold symptoms consisting of a dry cough with some mild nasal congestion and mild sore throat.  He states he took over-the-counter cold medication which resolved his symptoms.  He states he has a lingering hoarse voice and wanted to be sure he did not have pneumonia.  No fevers, no difficulty breathing or swallowing, no ear pain, or other complaints.  States he has been drinking lots of coffee, orange juice, and Sprite to help his throat.  The history is provided by the patient.    Past Medical History:  Diagnosis Date  . Aortic regurgitation   . Aortic regurgitation   . Aortic root enlargement ([place])   . Arthritis    both shoulders - RC- wear  . Heart murmur   . History of pulmonary embolism 04/24/2014   Provoked; occurred after AVR in 2016  . HTN (hypertension)   . Hx of echocardiogram    Echo 3/16: mild LVH, EF 40-45%, ant-septal AK, AVR with elevated velocity (3.3 m/s - slightly above expected value), mild LAE //  Echo 3/17: Mild LVH, EF 40%, diffuse HK, indeterminate diastolic function, bioprosthetic AVR well-functioning (mean gradient 9 mmHg), MAC, mild LAE, mildly reduced RVSF, mild RAE  . Hypercholesteremia     Patient Active Problem List   Diagnosis Date Noted  . Coronary artery disease involving native coronary artery of native heart without angina pectoris 12/27/2016  . NICM (nonischemic cardiomyopathy) ([place]) 05/19/2014  . Pleuritic chest pain   . History of pulmonary embolism 04/24/2014  . History of DVT (deep vein  thrombosis) 04/24/2014  . S/P AVR 04/11/2014  . Aortic root enlargement ([facility]) 12/29/2012    Class: Chronic  . Essential hypertension 12/29/2012    Class: Chronic    Past Surgical History:  Procedure Laterality Date  . AORTIC VALVE REPLACEMENT N/A 04/11/2014   Procedure: AORTIC VALVE REPLACEMENT (AVR);  Surgeon: Gaye Pollack, MD;  Location: [place];  Service: Open Heart Surgery;  Laterality: N/A;  . CARDIAC CATHETERIZATION    . LEFT AND RIGHT HEART CATHETERIZATION WITH CORONARY ANGIOGRAM N/A 02/22/2014   Procedure: LEFT AND RIGHT HEART CATHETERIZATION WITH CORONARY ANGIOGRAM;  Surgeon: Sinclair Grooms, MD;  Location: [facility] CATH LAB;  Service: Cardiovascular;  Laterality: N/A;  . MULTIPLE TOOTH EXTRACTIONS    . TEE WITHOUT CARDIOVERSION N/A 03/24/2014   Procedure: TRANSESOPHAGEAL ECHOCARDIOGRAM (TEE);  Surgeon: Dorothy Spark, MD;  Location: [place];  Service: Cardiovascular;  Laterality: N/A;  . TEE WITHOUT CARDIOVERSION N/A 04/11/2014   Procedure: TRANSESOPHAGEAL ECHOCARDIOGRAM (TEE);  Surgeon: Gaye Pollack, MD;  Location: [place];  Service: Open Heart Surgery;  Laterality: N/A;        Home Medications    Prior to Admission medications   Medication Sig Start Date End Date Taking? Authorizing Provider  aspirin EC 81 MG EC tablet Take 1 tablet (81 mg total) by mouth daily. Patient taking differently: Take 81 mg by mouth at bedtime.  04/26/14   Barton Dubois, MD  atorvastatin (LIPITOR) 20 MG tablet TAKE 1 TABLET  BY MOUTH EVERY DAY MAKE APPT WITH DR Anderson REFILL 12/19/17   Belva Crome, MD  atorvastatin (LIPITOR) 20 MG tablet Take 1 tablet (20 mg total) by mouth daily at 6 PM. 01/16/18   Belva Crome, MD  lisinopril-hydrochlorothiazide (PRINZIDE,ZESTORETIC) 20-25 MG tablet Take 1 tablet daily by mouth. Patient taking differently: Take 0.5 tablets by mouth daily.  01/06/17   Belva Crome, MD  metoprolol tartrate (LOPRESSOR) 25 MG tablet Take 1 tablet (25 mg total) by  mouth 2 (two) times daily. 04/15/14   Barrett, Erin R, PA-C  Multiple Vitamin (MULTIVITAMIN) capsule Take 1 capsule by mouth daily.    [provider]    Family History Family History  Problem Relation Age of Onset  . Hypertension Mother   . Heart attack Father   . Hypertension Daughter   . Hypertension Sister   . Hypertension Brother   . Stroke Neg Hx     Social History Social History   Tobacco Use  . Smoking status: Former Smoker    Types: Cigars    Last attempt to quit: 05/29/2001    Years since quitting: 16.7  . Smokeless tobacco: Never Used  Substance Use Topics  . Alcohol use: No    Alcohol/week: 0.0 standard drinks  . Drug use: No     Allergies   Patient has no known allergies.   Review of Systems Review of Systems  Constitutional: Negative for fever.  HENT: Positive for congestion (Resolved), sore throat (Resolved) and voice change. Negative for ear pain and trouble swallowing.   Respiratory: Positive for cough (Resolved). Negative for shortness of breath.      Physical Exam Updated Vital Signs BP (!) 143/93 (BP Location: Right Arm)   Pulse 92   Temp 98.2 F (36.8 C) (Oral)   Resp 18   SpO2 100%   Physical Exam Vitals signs and nursing note reviewed.  Constitutional:      General: He is not in acute distress.    Appearance: He is well-developed. He is not ill-appearing.  HENT:     Head: Normocephalic and atraumatic.     Right Ear: Tympanic membrane and ear canal normal.     Ears:     Comments: Unable to visualize left TM secondary to cerumen.    Mouth/Throat:     Pharynx: Oropharynx is clear. No oropharyngeal exudate.     Comments: Tolerating secretions.  Uvula midline, no trismus. Eyes:     Conjunctiva/sclera: Conjunctivae normal.  Cardiovascular:     Rate and Rhythm: Normal rate and regular rhythm.  Pulmonary:     Effort: Pulmonary effort is normal.     Breath sounds: Normal breath sounds.  Neurological:     Mental Status: He is  alert.  Psychiatric:        Mood and Affect: Mood normal.        Behavior: Behavior normal.      ED Treatments / Results  Labs (all labs ordered are listed, but only abnormal results are displayed) Labs Reviewed - No data to display  EKG None  Radiology Dg Chest 2 View  Result Date: 02/22/2018 CLINICAL DATA:  Congestion EXAM: CHEST - 2 VIEW COMPARISON:  05/11/2014 FINDINGS: The heart size and mediastinal contours are within normal limits. Both lungs are clear. The visualized skeletal structures are unremarkable. IMPRESSION: No active cardiopulmonary disease. Electronically Signed   By: Ulyses Jarred M.D.   On: 02/22/2018 06:30    Procedures Procedures (including critical  care time)  Medications Ordered in ED Medications - No data to display   Initial Impression / Assessment and Plan / ED Course  I have reviewed the triage vital signs and the nursing notes.  Pertinent labs & imaging results that were available during my care of the patient were reviewed by me and considered in my medical decision making (see chart for details).     Patient presenting with hoarse voice after resolved cold symptoms.  Otherwise asymptomatic.  Vital signs are reassuring, afebrile.  Tolerating secretions.  Lungs are clear bilaterally.  Chest x-ray obtained in triage is negative for infiltrate.  Provided reassurance.  Discussed symptomatic management including warm tea with honey, humidified air, voice rest.  Commend patient avoid singing in the choir for few days.  Recommend he limit his acidic drinks.  Follow-up with PCP as needed.  Safe for discharge.  Discussed results, findings, treatment and follow up. Patient advised of return precautions. Patient verbalized understanding and agreed with plan.   Final Clinical Impressions(s) / ED Diagnoses   Final diagnoses:  Laryngitis    ED Discharge Orders    None       Robinson, [place] N, PA-C 02/22/18 [id number]    Lacretia Leigh, MD 02/23/18  [phone]

## 2018-02-22 NOTE — Discharge Instructions (Signed)
Limit your acidic drinks, including coffee and orange juice. Drink plenty of water.  You can drink warm tea with honey to soothe your throat.  You can use humidified air as well.  Rest your voice.  Follow-up with your primary care if symptoms persist.

## 2018-02-22 NOTE — ED Triage Notes (Signed)
Patient c/o cold symptoms (congestion, phlegm) since Friday; states "I can't get the cold out of my body".

## 2018-03-23 NOTE — Progress Notes (Signed)
Cardiology Office Note:    Date:  03/24/2018   ID:  Frederick Burnett, DOB [birth year], MRN [id number]  PCP:  Aurea Graff.Marlou Sa, MD  Cardiologist:  Sinclair Grooms, MD   Referring MD: Aurea Graff.Marlou Sa, MD   Chief Complaint  Patient presents with  . Cardiac Valve Problem    Bioprosthetic valve    History of Present Illness:    Frederick Burnett is a 78 y.o. male with a hx of bicuspid aortic valve treated with bioprosthetic valve replacement in 2016, hypertension, asymptomatic left ventricular systolic dysfunction, hypertension, and hyperlipidemia.  He has done well since last being seen by Richardson Dopp in late 2018.  He denies orthopnea, PND, syncope, edema, chest pain, and palpitations.  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  He is compliant with his current medical regimen.  He denies neurological symptoms.  He has had no significant palpitations or arrhythmia.  No episodes of syncope.  Past Medical History:  Diagnosis Date  . Aortic regurgitation   . Aortic regurgitation   . Aortic root enlargement ([place])   . Arthritis    both shoulders - RC- wear  . Heart murmur   . History of pulmonary embolism 04/24/2014   Provoked; occurred after AVR in 2016  . HTN (hypertension)   . Hx of echocardiogram    Echo 3/16: mild LVH, EF 40-45%, ant-septal AK, AVR with elevated velocity (3.3 m/s - slightly above expected value), mild LAE //  Echo 3/17: Mild LVH, EF 40%, diffuse HK, indeterminate diastolic function, bioprosthetic AVR well-functioning (mean gradient 9 mmHg), MAC, mild LAE, mildly reduced RVSF, mild RAE  . Hypercholesteremia     Past Surgical History:  Procedure Laterality Date  . AORTIC VALVE REPLACEMENT N/A 04/11/2014   Procedure: AORTIC VALVE REPLACEMENT (AVR);  Surgeon: Gaye Pollack, MD;  Location: [place];  Service: Open Heart Surgery;  Laterality: N/A;  . CARDIAC CATHETERIZATION    . LEFT AND RIGHT HEART CATHETERIZATION WITH CORONARY ANGIOGRAM  N/A 02/22/2014   Procedure: LEFT AND RIGHT HEART CATHETERIZATION WITH CORONARY ANGIOGRAM;  Surgeon: Sinclair Grooms, MD;  Location: [facility] CATH LAB;  Service: Cardiovascular;  Laterality: N/A;  . MULTIPLE TOOTH EXTRACTIONS    . TEE WITHOUT CARDIOVERSION N/A 03/24/2014   Procedure: TRANSESOPHAGEAL ECHOCARDIOGRAM (TEE);  Surgeon: Dorothy Spark, MD;  Location: [place];  Service: Cardiovascular;  Laterality: N/A;  . TEE WITHOUT CARDIOVERSION N/A 04/11/2014   Procedure: TRANSESOPHAGEAL ECHOCARDIOGRAM (TEE);  Surgeon: Gaye Pollack, MD;  Location: [place];  Service: Open Heart Surgery;  Laterality: N/A;    Current Medications: Current Meds  Medication Sig  . aspirin EC 81 MG EC tablet Take 1 tablet (81 mg total) by mouth daily.  Marland Kitchen atorvastatin (LIPITOR) 20 MG tablet TAKE 1 TABLET BY MOUTH EVERY DAY MAKE APPT WITH DR Tamala Julian FOR FUTURE REFILL  . atorvastatin (LIPITOR) 20 MG tablet Take 1 tablet (20 mg total) by mouth daily at 6 PM.  . lisinopril-hydrochlorothiazide (PRINZIDE,ZESTORETIC) 20-25 MG tablet Take 0.5 tablets by mouth daily.  . metoprolol tartrate (LOPRESSOR) 25 MG tablet Take 1 tablet (25 mg total) by mouth 2 (two) times daily.  . Multiple Vitamin (MULTIVITAMIN) capsule Take 1 capsule by mouth daily.     Allergies:   Patient has no known allergies.   Social History   Socioeconomic History  . Marital status: Widowed    Spouse name: Not on file  . Number of children: Not on file  .  Years of education: Not on file  . Highest education level: Not on file  Occupational History  . Not on file  Social Needs  . Financial resource strain: Not on file  . Food insecurity:    Worry: Not on file    Inability: Not on file  . Transportation needs:    Medical: Not on file    Non-medical: Not on file  Tobacco Use  . Smoking status: Former Smoker    Types: Cigars    Last attempt to quit: 05/29/2001    Years since quitting: 16.8  . Smokeless tobacco: Never Used  Substance and Sexual  Activity  . Alcohol use: No    Alcohol/week: 0.0 standard drinks  . Drug use: No  . Sexual activity: Not on file  Lifestyle  . Physical activity:    Days per week: Not on file    Minutes per session: Not on file  . Stress: Not on file  Relationships  . Social connections:    Talks on phone: Not on file    Gets together: Not on file    Attends religious service: Not on file    Active member of club or organization: Not on file    Attends meetings of clubs or organizations: Not on file    Relationship status: Not on file  Other Topics Concern  . Not on file  Social History Narrative  . Not on file     Family History: The patient's family history includes Heart attack in his father; Hypertension in his brother, daughter, mother, and sister. There is no history of Stroke.  ROS:   Please see the history of present illness.    No complaints.  All other systems reviewed and are negative.  EKGs/Labs/Other Studies Reviewed:    The following studies were reviewed today: Last echocardiogram was 2017: Study Conclusions  - Left ventricle: The cavity size was normal. Wall thickness was   increased in a pattern of mild LVH. The estimated ejection   fraction was 40%. Diffuse hypokinesis. Indeterminant diastolic   function, ?junctional rhythm. - Aortic valve: Bioprosthetic aortic valve. There was no stenosis.   There was no regurgitation. Mean gradient (S): 9 mm Hg. - Mitral valve: Mildly calcified annulus. There was no significant   regurgitation. - Left atrium: The atrium was mildly dilated. - Right ventricle: The cavity size was normal. Systolic function   was mildly reduced. - Right atrium: The atrium was mildly dilated. - Pulmonary arteries: No complete TR doppler jet so unable to   estimate PA systolic pressure. - Inferior vena cava: The vessel was normal in size. The   respirophasic diameter changes were in the normal range (= 50%),   consistent with normal central venous  pressure.  Impressions:  - Normal LV size with mild LV hypertrophy. EF 40%, diffuse   hypokinesis. There was a bioprosthetic aortic valve that appeared   to be functioning normally. Normal RV size with mildly decreased   systolic function.  EKG:  EKG normal sinus rhythm, biatrial abnormality, prominent voltage consistent with LVH.  Recent Labs: No results found for requested labs within last 8760 hours.  Recent Lipid Panel    Component Value Date/Time   CHOL 127 06/07/2014 1018   TRIG 81.0 06/07/2014 1018   HDL 49.60 06/07/2014 1018   CHOLHDL 3 06/07/2014 1018   VLDL 16.2 06/07/2014 1018   LDLCALC 61 06/07/2014 1018    Physical Exam:    VS:  BP 136/88  Pulse 75   Ht _0  (1.753 m)   Wt 209 lb 6.4 oz (95 kg)   SpO2 97%   BMI 30.92 kg/m     Wt Readings from Last 3 Encounters:  03/24/18 209 lb 6.4 oz (95 kg)  02/26/17 208 lb (94.3 kg)  12/27/16 200 lb (90.7 kg)     GEN: Moderate obesity. No acute distress HEENT: Normal NECK: No JVD. LYMPHATICS: No lymphadenopathy CARDIAC: RRR.  No diastolic murmur.  1-2 over 6 right upper sternal border systolic murmur.,  S4 gallop, no peripheral edema VASCULAR: 2+ bilateral radial pulses, no carotid bruits RESPIRATORY:  Clear to auscultation without rales, wheezing or rhonchi  ABDOMEN: Soft, non-tender, non-distended, No pulsatile mass, MUSCULOSKELETAL: No deformity  SKIN: Warm and dry NEUROLOGIC:  Alert and oriented x 3 PSYCHIATRIC:  Normal affect   ASSESSMENT:    1. S/P AVR   2. NICM (nonischemic cardiomyopathy) ([place])   3. Essential hypertension   4. Coronary artery disease involving native coronary artery of native heart without angina pectoris    PLAN:    In order of problems listed above:  1. Clinically normal valve function 2. Mildly depressed LV systolic function last documented in 2017.  Will repeat echocardiogram prior to next office visit. 3. Blood pressure control is excellent.  Target 130/80  mmHg. 4. Asymptomatic with reference to angina.  Aerobic activity.  Discussed briefly, secondary prevention.  Needs follow-up echo to exclude the possibility of progressive LV dysfunction.  He is currently on beta-blocker therapy and ACE inhibitor therapy although this could be optimized if there is progression of systolic dysfunction.   Medication Adjustments/Labs and Tests Ordered: Current medicines are reviewed at length with the patient today.  Concerns regarding medicines are outlined above.  Orders Placed This Encounter  Procedures  . EKG 12-Lead   No orders of the defined types were placed in this encounter.   Patient Instructions  Medication Instructions:  Your physician recommends that you continue on your current medications as directed. Please refer to the Current Medication list given to you today.  If you need a refill on your cardiac medications before your next appointment, please call your pharmacy.   Lab work: None If you have labs (blood work) drawn today and your tests are completely normal, you will receive your results only by: Marland Kitchen MyChart Message (if you have MyChart) OR . A paper copy in the mail If you have any lab test that is abnormal or we need to change your treatment, we will call you to review the results.  Testing/Procedures: None  Follow-Up: At [facility], you and your health needs are our priority.  As part of our continuing mission to provide you with exceptional heart care, we have created designated Provider Care Teams.  These Care Teams include your primary Cardiologist (physician) and Advanced Practice Providers (APPs -  Physician Assistants and Nurse Practitioners) who all work together to provide you with the care you need, when you need it. You will need a follow up appointment in 12 months.  Please call our office 2 months in advance to schedule this appointment.  You may see Sinclair Grooms, MD or one of the following Advanced Practice  Providers on your designated Care Team:   Truitt Merle, NP Cecilie Kicks, NP . Kathyrn Drown, NP  Any Other Special Instructions Will Be Listed Below (If Applicable).       Signed, Sinclair Grooms, MD  03/24/2018 11:19 AM  [facility]

## 2018-03-24 ENCOUNTER — Encounter: Payer: Self-pay | Admitting: *Deleted

## 2018-03-24 ENCOUNTER — Ambulatory Visit: Payer: Medicare HMO | Admitting: Interventional Cardiology

## 2018-03-24 ENCOUNTER — Encounter (INDEPENDENT_AMBULATORY_CARE_PROVIDER_SITE_OTHER): Payer: Self-pay

## 2018-03-24 ENCOUNTER — Encounter: Payer: Self-pay | Admitting: Interventional Cardiology

## 2018-03-24 VITALS — BP 136/88 | HR 75 | Ht 69.0 in | Wt 209.4 lb

## 2018-03-24 DIAGNOSIS — Z952 Presence of prosthetic heart valve: Secondary | ICD-10-CM

## 2018-03-24 DIAGNOSIS — I10 Essential (primary) hypertension: Secondary | ICD-10-CM | POA: Diagnosis not present

## 2018-03-24 DIAGNOSIS — I2510 Atherosclerotic heart disease of native coronary artery without angina pectoris: Secondary | ICD-10-CM

## 2018-03-24 DIAGNOSIS — I428 Other cardiomyopathies: Secondary | ICD-10-CM | POA: Diagnosis not present

## 2018-03-24 NOTE — Patient Instructions (Signed)

## 2018-03-30 DIAGNOSIS — D171 Benign lipomatous neoplasm of skin and subcutaneous tissue of trunk: Secondary | ICD-10-CM | POA: Diagnosis not present

## 2018-03-30 DIAGNOSIS — E7800 Pure hypercholesterolemia, unspecified: Secondary | ICD-10-CM | POA: Diagnosis not present

## 2018-03-30 DIAGNOSIS — I10 Essential (primary) hypertension: Secondary | ICD-10-CM | POA: Diagnosis not present

## 2018-03-30 DIAGNOSIS — I352 Nonrheumatic aortic (valve) stenosis with insufficiency: Secondary | ICD-10-CM | POA: Diagnosis not present

## 2018-04-01 ENCOUNTER — Other Ambulatory Visit: Payer: Self-pay | Admitting: Interventional Cardiology

## 2018-04-17 ENCOUNTER — Other Ambulatory Visit: Payer: Self-pay | Admitting: Interventional Cardiology

## 2018-10-05 DIAGNOSIS — I10 Essential (primary) hypertension: Secondary | ICD-10-CM | POA: Diagnosis not present

## 2018-10-05 DIAGNOSIS — I352 Nonrheumatic aortic (valve) stenosis with insufficiency: Secondary | ICD-10-CM | POA: Diagnosis not present

## 2018-10-05 DIAGNOSIS — E7800 Pure hypercholesterolemia, unspecified: Secondary | ICD-10-CM | POA: Diagnosis not present

## 2018-10-07 DIAGNOSIS — I10 Essential (primary) hypertension: Secondary | ICD-10-CM | POA: Diagnosis not present

## 2018-11-07 DIAGNOSIS — Z20828 Contact with and (suspected) exposure to other viral communicable diseases: Secondary | ICD-10-CM | POA: Diagnosis not present

## 2019-02-01 ENCOUNTER — Other Ambulatory Visit: Payer: Self-pay | Admitting: *Deleted

## 2019-02-01 DIAGNOSIS — Z952 Presence of prosthetic heart valve: Secondary | ICD-10-CM

## 2019-03-01 ENCOUNTER — Ambulatory Visit (HOSPITAL_COMMUNITY): Payer: Medicare HMO | Attending: Internal Medicine

## 2019-03-01 ENCOUNTER — Other Ambulatory Visit: Payer: Self-pay

## 2019-03-01 DIAGNOSIS — Z952 Presence of prosthetic heart valve: Secondary | ICD-10-CM | POA: Diagnosis not present

## 2019-03-28 ENCOUNTER — Other Ambulatory Visit: Payer: Self-pay | Admitting: Interventional Cardiology

## 2019-03-29 NOTE — Progress Notes (Deleted)
Cardiology Office Note:    Date:  03/29/2019   ID:  Frederick Burnett, DOB December 16, 1940, MRN [id number]  PCP:  Aurea Graff.Marlou Sa, MD  Cardiologist:  Sinclair Grooms, MD   Referring MD: Aurea Graff.Marlou Sa, MD   No chief complaint on file.   History of Present Illness:    Frederick Burnett is a 79 y.o. male with a hx of bicuspid aortic valve treated with bioprosthetic valve replacement in 2016, hypertension, asymptomatic left ventricular systolic dysfunction, hypertension, and hyperlipidemia.  ***  Past Medical History:  Diagnosis Date  . Aortic regurgitation   . Aortic regurgitation   . Aortic root enlargement ([place])   . Arthritis    both shoulders - RC- wear  . Heart murmur   . History of pulmonary embolism 04/24/2014   Provoked; occurred after AVR in 2016  . HTN (hypertension)   . Hx of echocardiogram    Echo 3/16: mild LVH, EF 40-45%, ant-septal AK, AVR with elevated velocity (3.3 m/s - slightly above expected value), mild LAE //  Echo 3/17: Mild LVH, EF 40%, diffuse HK, indeterminate diastolic function, bioprosthetic AVR well-functioning (mean gradient 9 mmHg), MAC, mild LAE, mildly reduced RVSF, mild RAE  . Hypercholesteremia     Past Surgical History:  Procedure Laterality Date  . AORTIC VALVE REPLACEMENT N/A 04/11/2014   Procedure: AORTIC VALVE REPLACEMENT (AVR);  Surgeon: Gaye Pollack, MD;  Location: [place];  Service: Open Heart Surgery;  Laterality: N/A;  . CARDIAC CATHETERIZATION    . LEFT AND RIGHT HEART CATHETERIZATION WITH CORONARY ANGIOGRAM N/A 02/22/2014   Procedure: LEFT AND RIGHT HEART CATHETERIZATION WITH CORONARY ANGIOGRAM;  Surgeon: Sinclair Grooms, MD;  Location: [facility] CATH LAB;  Service: Cardiovascular;  Laterality: N/A;  . MULTIPLE TOOTH EXTRACTIONS    . TEE WITHOUT CARDIOVERSION N/A 03/24/2014   Procedure: TRANSESOPHAGEAL ECHOCARDIOGRAM (TEE);  Surgeon: Dorothy Spark, MD;  Location: [place];  Service: Cardiovascular;  Laterality: N/A;  . TEE WITHOUT  CARDIOVERSION N/A 04/11/2014   Procedure: TRANSESOPHAGEAL ECHOCARDIOGRAM (TEE);  Surgeon: Gaye Pollack, MD;  Location: [place];  Service: Open Heart Surgery;  Laterality: N/A;    Current Medications: No outpatient medications have been marked as taking for the 03/30/19 encounter (Appointment) with Belva Crome, MD.     Allergies:   Patient has no known allergies.   Social History   Socioeconomic History  . Marital status: Widowed    Spouse name: Not on file  . Number of children: Not on file  . Years of education: Not on file  . Highest education level: Not on file  Occupational History  . Not on file  Tobacco Use  . Smoking status: Former Smoker    Types: Cigars    Quit date: 05/29/2001    Years since quitting: 17.8  . Smokeless tobacco: Never Used  Substance and Sexual Activity  . Alcohol use: No    Alcohol/week: 0.0 standard drinks  . Drug use: No  . Sexual activity: Not on file  Other Topics Concern  . Not on file  Social History Narrative  . Not on file   Social Determinants of Health   Financial Resource Strain:   . Difficulty of Paying Living Expenses: Not on file  Food Insecurity:   . Worried About Charity fundraiser in the Last Year: Not on file  . Ran Out of Food in the Last Year: Not on file  Transportation Needs:   . Lack of Transportation (Medical):  Not on file  . Lack of Transportation (Non-Medical): Not on file  Physical Activity:   . Days of Exercise per Week: Not on file  . Minutes of Exercise per Session: Not on file  Stress:   . Feeling of Stress : Not on file  Social Connections:   . Frequency of Communication with Friends and Family: Not on file  . Frequency of Social Gatherings with Friends and Family: Not on file  . Attends Religious Services: Not on file  . Active Member of Clubs or Organizations: Not on file  . Attends Archivist Meetings: Not on file  . Marital Status: Not on file     Family History: The patient's family  history includes Heart attack in his father; Hypertension in his brother, daughter, mother, and sister. There is no history of Stroke.  ROS:   Please see the history of present illness.    *** All other systems reviewed and are negative.  EKGs/Labs/Other Studies Reviewed:    The following studies were reviewed today: ***  EKG:  EKG ***  Recent Labs: No results found for requested labs within last 8760 hours.  Recent Lipid Panel    Component Value Date/Time   CHOL 127 06/07/2014 1018   TRIG 81.0 06/07/2014 1018   HDL 49.60 06/07/2014 1018   CHOLHDL 3 06/07/2014 1018   VLDL 16.2 06/07/2014 1018   LDLCALC 61 06/07/2014 1018    Physical Exam:    VS:  There were no vitals taken for this visit.    Wt Readings from Last 3 Encounters:  03/24/18 209 lb 6.4 oz (95 kg)  02/26/17 208 lb (94.3 kg)  12/27/16 200 lb (90.7 kg)     GEN: ***. No acute distress HEENT: Normal NECK: No JVD. LYMPHATICS: No lymphadenopathy CARDIAC: *** RRR without murmur, gallop, or edema. VASCULAR: *** Normal Pulses. No bruits. RESPIRATORY:  Clear to auscultation without rales, wheezing or rhonchi  ABDOMEN: Soft, non-tender, non-distended, No pulsatile mass, MUSCULOSKELETAL: No deformity  SKIN: Warm and dry NEUROLOGIC:  Alert and oriented x 3 PSYCHIATRIC:  Normal affect   ASSESSMENT:    1. S/P AVR   2. Coronary artery disease involving native coronary artery of native heart without angina pectoris   3. Essential hypertension   4. NICM (nonischemic cardiomyopathy) ([place])   5. Educated about COVID-19 virus infection    PLAN:    In order of problems listed above:  1. ***   Medication Adjustments/Labs and Tests Ordered: Current medicines are reviewed at length with the patient today.  Concerns regarding medicines are outlined above.  No orders of the defined types were placed in this encounter.  No orders of the defined types were placed in this encounter.   There are no Patient  Instructions on file for this visit.   Signed, Sinclair Grooms, MD  03/29/2019 8:35 PM    [facility]

## 2019-03-30 ENCOUNTER — Ambulatory Visit: Payer: Medicare HMO | Admitting: Interventional Cardiology

## 2019-03-30 ENCOUNTER — Other Ambulatory Visit: Payer: Self-pay | Admitting: Interventional Cardiology

## 2019-04-06 ENCOUNTER — Encounter: Payer: Self-pay | Admitting: *Deleted

## 2019-04-07 ENCOUNTER — Telehealth: Payer: Self-pay | Admitting: Interventional Cardiology

## 2019-04-07 NOTE — Telephone Encounter (Signed)
Left message to call back  Pt needs to come in to be seen.  Missed appt with Dr. Tamala Julian last week.  Also needs echo results reviewed.

## 2019-04-07 NOTE — Telephone Encounter (Signed)
New message   Patient wants a [employer] letter for his job. Please call to discuss.

## 2019-04-08 DIAGNOSIS — I352 Nonrheumatic aortic (valve) stenosis with insufficiency: Secondary | ICD-10-CM | POA: Diagnosis not present

## 2019-04-08 DIAGNOSIS — I10 Essential (primary) hypertension: Secondary | ICD-10-CM | POA: Diagnosis not present

## 2019-04-08 DIAGNOSIS — E7800 Pure hypercholesterolemia, unspecified: Secondary | ICD-10-CM | POA: Diagnosis not present

## 2019-04-08 NOTE — Telephone Encounter (Signed)
Spoke with pt and reviewed echo results.  Scheduled him to come in Monday for annual f/u.  Advised we can provide letter once he is seen.

## 2019-04-08 NOTE — Progress Notes (Signed)
CARDIOLOGY OFFICE NOTE  Date:  04/12/2019    Frederick Burnett Date of Birth: [birth year] Medical Record #[id number]  PCP:  Alroy Dust, L.Marlou Sa, MD  Cardiologist:  Tamala Julian   Chief Complaint  Patient presents with  . Follow-up    Seen for Dr. Tamala Julian    History of Present Illness: Frederick Burnett is a 79 y.o. male who presents today for a one year check. Seen for Dr. Tamala Julian.   He has a history of bicuspid AV with prior bioprosthetic AVR in 2016 per Dr. Cyndia Bent, HTN, asymptomatic LV systolic dysfunction, HTN and HLD.  He has had prior accelerated junctional rhythm on prior EKGs noted.   Last seen a little over one year ago by Dr. Tamala Julian. He was felt to be doing well.   The patient does not have symptoms concerning for COVID-19 infection (fever, chills, cough, or new shortness of breath).   Comes in today. Here alone. He is doing well. No chest pain. Breathing is ok. He has lost a few pounds. He paces himself for his activities. He raised a garden this past summer. He is not on his aspirin any more. Did not get his echo results. 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 Not dizzy or lightheaded. He has no real concerns.   Past Medical History:  Diagnosis Date  . Aortic regurgitation   . Aortic regurgitation   . Aortic root enlargement ([place])   . Arthritis    both shoulders - RC- wear  . Heart murmur   . History of pulmonary embolism 04/24/2014   Provoked; occurred after AVR in 2016  . HTN (hypertension)   . Hx of echocardiogram    Echo 3/16: mild LVH, EF 40-45%, ant-septal AK, AVR with elevated velocity (3.3 m/s - slightly above expected value), mild LAE //  Echo 3/17: Mild LVH, EF 40%, diffuse HK, indeterminate diastolic function, bioprosthetic AVR well-functioning (mean gradient 9 mmHg), MAC, mild LAE, mildly reduced RVSF, mild RAE  . Hypercholesteremia     Past Surgical History:  Procedure Laterality Date  . AORTIC VALVE REPLACEMENT N/A 04/11/2014   Procedure: AORTIC VALVE  REPLACEMENT (AVR);  Surgeon: Gaye Pollack, MD;  Location: [place];  Service: Open Heart Surgery;  Laterality: N/A;  . CARDIAC CATHETERIZATION    . LEFT AND RIGHT HEART CATHETERIZATION WITH CORONARY ANGIOGRAM N/A 02/22/2014   Procedure: LEFT AND RIGHT HEART CATHETERIZATION WITH CORONARY ANGIOGRAM;  Surgeon: Sinclair Grooms, MD;  Location[facility] CATH LAB;  Service: Cardiovascular;  Laterality: N/A;  . MULTIPLE TOOTH EXTRACTIONS    . TEE WITHOUT CARDIOVERSION N/A 03/24/2014   Procedure: TRANSESOPHAGEAL ECHOCARDIOGRAM (TEE);  Surgeon: Dorothy Spark, MD;  Location: [place];  Service: Cardiovascular;  Laterality: N/A;  . TEE WITHOUT CARDIOVERSION N/A 04/11/2014   Procedure: TRANSESOPHAGEAL ECHOCARDIOGRAM (TEE);  Surgeon: Gaye Pollack, MD;  Location: [place];  Service: Open Heart Surgery;  Laterality: N/A;     Medications: Current Meds  Medication Sig  . atorvastatin (LIPITOR) 20 MG tablet TAKE 1 TABLET (20 MG TOTAL) BY MOUTH DAILY AT 6 PM.  . lisinopril-hydrochlorothiazide (ZESTORETIC) 20-25 MG tablet TAKE 1/2 TABLET BY MOUTH EVERY DAY  . metoprolol tartrate (LOPRESSOR) 25 MG tablet Take 1 tablet (25 mg total) by mouth 2 (two) times daily.  . Multiple Vitamin (MULTIVITAMIN) capsule Take 1 capsule by mouth daily.     Allergies: No Known Allergies  Social History: The patient  reports that he quit smoking about 17  years ago. His smoking use included cigars. He has never used smokeless tobacco. He reports that he does not drink alcohol or use drugs.   Family History: The patient's family history includes Heart attack in his father; Hypertension in his brother, daughter, mother, and sister.   Review of Systems: Please see the history of present illness.   All other systems are reviewed and negative.   Physical Exam: VS:  BP (!) 124/92   Pulse 69   Ht _0  (1.753 m)   Wt 203 lb 6.4 oz (92.3 kg)   SpO2 96%   BMI 30.04 kg/m  .  BMI Body mass index is 30.04 kg/m.  Wt Readings  from Last 3 Encounters:  04/12/19 203 lb 6.4 oz (92.3 kg)  03/24/18 209 lb 6.4 oz (95 kg)  02/26/17 208 lb (94.3 kg)   BP is 130/80 by me  General: Pleasant. Alert and in no acute distress. Weight is down 6 pounds.    Neck: Supple, no JVD, carotid bruits, or masses noted.  Cardiac: Regular rate and rhythm. Soft outflow murmur. No edema.  Respiratory:  Lungs are clear to auscultation bilaterally with normal work of breathing.  MS: No deformity or atrophy. Gait and ROM intact.  Skin: Warm and dry. Color is normal.  Neuro:  Strength and sensation are intact and no gross focal deficits noted.  Psych: Alert, appropriate and with normal affect.   LABORATORY DATA:  EKG:  EKG is ordered today. This demonstrates an accelerated junctional rhythm today - HR is 69. This is unchanged from prior tracings.   Lab Results  Component Value Date   WBC 5.6 02/26/2017   HGB 13.4 02/26/2017   HCT 41.4 02/26/2017   PLT 128 (L) 02/26/2017   GLUCOSE 108 (H) 02/26/2017   CHOL 127 06/07/2014   TRIG 81.0 06/07/2014   HDL 49.60 06/07/2014   LDLCALC 61 06/07/2014   ALT 18 06/07/2014   AST 18 06/07/2014   NA 137 02/26/2017   K 3.5 02/26/2017   CL 105 02/26/2017   CREATININE 1.13 02/26/2017   BUN 12 02/26/2017   CO2 23 02/26/2017   TSH 0.782 04/25/2014   INR 1.24 04/25/2014   HGBA1C 5.7 (H) 04/25/2014       BNP (last 3 results) No results for input(s): BNP in the last 8760 hours.  ProBNP (last 3 results) No results for input(s): PROBNP in the last 8760 hours.   Other Studies Reviewed Today:  ECHO IMPRESSIONS 02/2019 1. Left ventricular ejection fraction, by visual estimation, is 45 to  50%. The left ventricle has mildly decreased function. Left ventricular  septal wall thickness was severely increased. There is severely increased  left ventricular hypertrophy.  2. The left ventricle demonstrates global hypokinesis.  3. Global right ventricle has mildly reduced systolic function.The  right  ventricular size is normal. No increase in right ventricular wall  thickness.  4. Left atrial size was normal.  5. Right atrial size was mildly dilated.  6. The mitral valve is grossly normal. Trivial mitral valve  regurgitation.  7. The tricuspid valve is grossly normal.  8. Aortic valve mean gradient measures 9.0 mmHg.  9. Aortic valve peak gradient measures 17.0 mmHg.  10. Aortic valve regurgitation is trivial.  11. The pulmonic valve was grossly normal. Pulmonic valve regurgitation is  trivial.  12. Aortic dilatation noted.  13. There is mild dilatation of the ascending aorta measuring 39 mm.  14. Normal pulmonary artery systolic pressure.  15. The  inferior vena cava is normal in size with greater than 50%  respiratory variability, suggesting right atrial pressure of 3 mmHg.  16. A prior study was performed on 05/23/2015.  17. Changes from prior study are noted.  18. Prior echo: LVEF 40%, global HK, AOV mn gradient 9 mmHg.   Let the patient know heart is thickened, pumping function is low normal, artificial valve is working normally. Things are very stable. A copy will be sent to [facility], L.Marlou Sa, MD  CARDIAC CATH 01/2014 IMPRESSIONS:  1. Severe aortic regurgitation with moderate aortic root enlargement 2. Moderate mitral regurgitation, hopefully related to LV enlargement and not primary valvular abnormality 3. Global left ventricular hypokinesis with an estimated ejection fraction of 35-40% 4. Widely patent coronary arteries 5. Normal pulmonary pressures and normal left heart filling pressures.   RECOMMENDATION:  Patient will need to be set up for a transesophageal echo The patient will be referred to Dr. Gilford Raid.  ASSESSMENT & PLAN    1. Prior AVR - most recent echo noted from last month - this was reviewed with him today. I would favor baby aspirin therapy.  He is felt to be stable from our standpoint to drive.   2. NICM - he is not symptomatic -  weight is stable - he has actually lost weight - will follow.   3. HTN - BP by me is fine - no changes made today.   4. CAD - had patent arteries at the time of his cath. Continue with risk factor modification.   5. Accelerated junctional rhythm - HR is fine. Will follow. He is totally asymptomatic.   6. COVID-19 Education: The signs and symptoms of COVID-19 were discussed with the patient and how to seek care for testing (follow up with PCP or arrange E-visit).  The importance of social distancing, staying at home, hand hygiene and wearing a mask when out in public were discussed today. He is trying to get a vaccine.   Current medicines are reviewed with the patient today.  The patient does not have concerns regarding medicines other than what has been noted above.  The following changes have been made:  See above.  Labs/ tests ordered today include:    Orders Placed This Encounter  Procedures  . EKG 12-Lead     Disposition:   FU with Dr. Tamala Julian in one year.   Patient is agreeable to this plan and will call if any problems develop in the interim.   SignedTruitt Merle, NP  04/12/2019 11:24 AM  [facility] [address] [place], [place]  [address] Phone: [phone] Fax: [phone]

## 2019-04-12 ENCOUNTER — Encounter: Payer: Self-pay | Admitting: Nurse Practitioner

## 2019-04-12 ENCOUNTER — Ambulatory Visit: Payer: Medicare HMO | Admitting: Nurse Practitioner

## 2019-04-12 ENCOUNTER — Other Ambulatory Visit: Payer: Self-pay

## 2019-04-12 VITALS — BP 124/92 | HR 69 | Ht 69.0 in | Wt 203.4 lb

## 2019-04-12 DIAGNOSIS — I428 Other cardiomyopathies: Secondary | ICD-10-CM | POA: Diagnosis not present

## 2019-04-12 DIAGNOSIS — I2510 Atherosclerotic heart disease of native coronary artery without angina pectoris: Secondary | ICD-10-CM

## 2019-04-12 DIAGNOSIS — I10 Essential (primary) hypertension: Secondary | ICD-10-CM | POA: Diagnosis not present

## 2019-04-12 DIAGNOSIS — Z952 Presence of prosthetic heart valve: Secondary | ICD-10-CM | POA: Diagnosis not present

## 2019-04-12 MED ORDER — ASPIRIN EC 81 MG PO TBEC: 81.0000 mg | DELAYED_RELEASE_TABLET | Freq: Every day | ORAL | 3 refills | Status: DC

## 2019-04-12 NOTE — Patient Instructions (Addendum)
After Visit Summary:  We will be checking the following labs today - NONE   Medication Instructions:    Continue with your current medicines.   I would recommend you restart your baby aspirin   If you need a refill on your cardiac medications before your next appointment, please call your pharmacy.     Testing/Procedures To Be Arranged:  N/A  Follow-Up:   See Dr. Tamala Julian in one year -  You will receive a reminder letter in the mail two months in advance. If you don't receive a letter, please call our office to schedule the follow-up appointment.     At [facility], you and your health needs are our priority.  As part of our continuing mission to provide you with exceptional heart care, we have created designated Provider Care Teams.  These Care Teams include your primary Cardiologist (physician) and Advanced Practice Providers (APPs -  Physician Assistants and Nurse Practitioners) who all work together to provide you with the care you need, when you need it.  Special Instructions:  . Stay safe, stay home, wash your hands for at least 20 seconds and wear a mask when out in public.  . It was good to talk with you today.    Call the [place] office at [phone] if you have any questions, problems or concerns.

## 2019-06-20 ENCOUNTER — Other Ambulatory Visit: Payer: Self-pay | Admitting: Interventional Cardiology

## 2019-06-23 ENCOUNTER — Other Ambulatory Visit: Payer: Self-pay | Admitting: Interventional Cardiology

## 2019-09-16 ENCOUNTER — Other Ambulatory Visit: Payer: Self-pay | Admitting: Interventional Cardiology

## 2019-10-08 DIAGNOSIS — Z23 Encounter for immunization: Secondary | ICD-10-CM | POA: Diagnosis not present

## 2019-10-08 DIAGNOSIS — I352 Nonrheumatic aortic (valve) stenosis with insufficiency: Secondary | ICD-10-CM | POA: Diagnosis not present

## 2019-10-08 DIAGNOSIS — E7800 Pure hypercholesterolemia, unspecified: Secondary | ICD-10-CM | POA: Diagnosis not present

## 2019-10-08 DIAGNOSIS — I10 Essential (primary) hypertension: Secondary | ICD-10-CM | POA: Diagnosis not present

## 2020-04-14 DIAGNOSIS — I352 Nonrheumatic aortic (valve) stenosis with insufficiency: Secondary | ICD-10-CM | POA: Diagnosis not present

## 2020-04-14 DIAGNOSIS — E7800 Pure hypercholesterolemia, unspecified: Secondary | ICD-10-CM | POA: Diagnosis not present

## 2020-04-14 DIAGNOSIS — I10 Essential (primary) hypertension: Secondary | ICD-10-CM | POA: Diagnosis not present

## 2020-04-14 DIAGNOSIS — Z0000 Encounter for general adult medical examination without abnormal findings: Secondary | ICD-10-CM | POA: Diagnosis not present

## 2020-04-14 DIAGNOSIS — Z23 Encounter for immunization: Secondary | ICD-10-CM | POA: Diagnosis not present

## 2020-05-04 ENCOUNTER — Telehealth: Payer: Self-pay

## 2020-05-04 DIAGNOSIS — Z0279 Encounter for issue of other medical certificate: Secondary | ICD-10-CM

## 2020-05-04 NOTE — Telephone Encounter (Signed)
Patient presented to the front desk today to drop off Cardiac Condition Checklist Form for Dr. Tamala Julian to complete. Mr. Frederick Burnett said he has 3 months to get this turned back in and likely needs an appointment in order for Dr. Tamala Julian to sign off. 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  I am sending the forms to HIM for them to log and disseminate to Dr. Albertha Ghee. Rather than signing an ROI for [place] to fax the forms directly in, Mr. Frederick Burnett said he would pick the forms up upon completion.  I am sending this phone note to nurse requesting she call to schedule him for a f/u appt. He does have a recall for 03/2020 but Dr. Thompson Caul next available is in July 2022 (after the forms deadline).

## 2020-05-04 NOTE — Telephone Encounter (Signed)
Left message to call back  

## 2020-05-05 ENCOUNTER — Telehealth: Payer: Self-pay | Admitting: Interventional Cardiology

## 2020-05-05 NOTE — Telephone Encounter (Signed)
Form from [employer] received on 05/05/20. Competed auth attached. Took form to Dr. Thompson Caul Box for completion. JG 05/05/20

## 2020-05-09 NOTE — Telephone Encounter (Signed)
Left message to call back  

## 2020-05-11 NOTE — Telephone Encounter (Signed)
Spoke with pt and scheduled him to see Dr. Tamala Julian in April.  Pt appreciative for call.

## 2020-06-13 NOTE — Progress Notes (Signed)
Cardiology Office Note:    Date:  06/14/2020   ID:  Frederick Burnett, DOB Aug 18, 1940, MRN [id number]  PCP:  Aurea Graff.Marlou Sa, MD  Cardiologist:  Sinclair Grooms, MD   Referring MD: Aurea Graff.Marlou Sa, MD   Chief Complaint  Patient presents with  . Cardiac Valve Problem  . Hypertension    History of Present Illness:    Frederick Burnett is a 80 y.o. male with a hx of bicuspid aortic valve treated with bioprosthetic valve replacement in 2016, hypertension, asymptomatic left ventricular systolic dysfunction, hypertension, and hyperlipidemia.  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  He has no cardiovascular complaints.  Specifically denies shortness of breath, syncope, palpitations, edema, and orthopnea.  He has not had any neurological complaints.  Denies back pain and chest pain.  He is status post aortic valve replacement.  He has low normal LVEF by echo done within the past 12 months.   Past Medical History:  Diagnosis Date  . Aortic regurgitation   . Aortic regurgitation   . Aortic root enlargement ([place])   . Arthritis    both shoulders - RC- wear  . Heart murmur   . History of pulmonary embolism 04/24/2014   Provoked; occurred after AVR in 2016  . HTN (hypertension)   . Hx of echocardiogram    Echo 3/16: mild LVH, EF 40-45%, ant-septal AK, AVR with elevated velocity (3.3 m/s - slightly above expected value), mild LAE //  Echo 3/17: Mild LVH, EF 40%, diffuse HK, indeterminate diastolic function, bioprosthetic AVR well-functioning (mean gradient 9 mmHg), MAC, mild LAE, mildly reduced RVSF, mild RAE  . Hypercholesteremia     Past Surgical History:  Procedure Laterality Date  . AORTIC VALVE REPLACEMENT N/A 04/11/2014   Procedure: AORTIC VALVE REPLACEMENT (AVR);  Surgeon: Gaye Pollack, MD;  Location: [place];  Service: Open Heart Surgery;  Laterality: N/A;  . CARDIAC CATHETERIZATION    . LEFT AND RIGHT HEART CATHETERIZATION  WITH CORONARY ANGIOGRAM N/A 02/22/2014   Procedure: LEFT AND RIGHT HEART CATHETERIZATION WITH CORONARY ANGIOGRAM;  Surgeon: Sinclair Grooms, MD;  Location: [facility] CATH LAB;  Service: Cardiovascular;  Laterality: N/A;  . MULTIPLE TOOTH EXTRACTIONS    . TEE WITHOUT CARDIOVERSION N/A 03/24/2014   Procedure: TRANSESOPHAGEAL ECHOCARDIOGRAM (TEE);  Surgeon: Dorothy Spark, MD;  Location: [place];  Service: Cardiovascular;  Laterality: N/A;  . TEE WITHOUT CARDIOVERSION N/A 04/11/2014   Procedure: TRANSESOPHAGEAL ECHOCARDIOGRAM (TEE);  Surgeon: Gaye Pollack, MD;  Location: [place];  Service: Open Heart Surgery;  Laterality: N/A;    Current Medications: Current Meds  Medication Sig  . aspirin EC 81 MG tablet Take 1 tablet (81 mg total) by mouth daily.  Marland Kitchen atorvastatin (LIPITOR) 20 MG tablet TAKE 1 TABLET (20 MG TOTAL) BY MOUTH DAILY AT 6 PM.  . lisinopril-hydrochlorothiazide (ZESTORETIC) 20-25 MG tablet TAKE 1/2 TABLET BY MOUTH EVERY DAY  . metoprolol tartrate (LOPRESSOR) 25 MG tablet Take 1 tablet (25 mg total) by mouth 2 (two) times daily.  . Multiple Vitamin (MULTIVITAMIN) capsule Take 1 capsule by mouth daily.     Allergies:   Patient has no known allergies.   Social History   Socioeconomic History  . Marital status: Widowed    Spouse name: Not on file  . Number of children: Not on file  . Years of education: Not on file  . Highest education level: Not on file  Occupational History  . Not on file  Tobacco Use  . Smoking status: Former Smoker    Types: Cigars    Quit date: 05/29/2001    Years since quitting: 19.0  . Smokeless tobacco: Never Used  Substance and Sexual Activity  . Alcohol use: No    Alcohol/week: 0.0 standard drinks  . Drug use: No  . Sexual activity: Not on file  Other Topics Concern  . Not on file  Social History Narrative  . Not on file   Social Determinants of Health   Financial Resource Strain: Not on file  Food Insecurity: Not on file  Transportation  Needs: Not on file  Physical Activity: Not on file  Stress: Not on file  Social Connections: Not on file     Family History: The patient's family history includes Heart attack in his father; Hypertension in his brother, daughter, mother, and sister. There is no history of Stroke.  ROS:   Please see the history of present illness.    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  Does not have limiting symptoms.  All other systems reviewed and are negative.  EKGs/Labs/Other Studies Reviewed:    The following studies were reviewed today:  2D-Doppler ECHOCARDIOGRAM January 2021 IMPRESSIONS    1. Left ventricular ejection fraction, by visual estimation, is 45 to  50%. The left ventricle has mildly decreased function. Left ventricular  septal wall thickness was severely increased. There is severely increased  left ventricular hypertrophy.  2. The left ventricle demonstrates global hypokinesis.  3. Global right ventricle has mildly reduced systolic function.The right  ventricular size is normal. No increase in right ventricular wall  thickness.  4. Left atrial size was normal.  5. Right atrial size was mildly dilated.  6. The mitral valve is grossly normal. Trivial mitral valve  regurgitation.  7. The tricuspid valve is grossly normal.  8. Aortic valve is a BIOPROSTHESIS with mean gradient measures 9.0 mmHg.  9. Aortic valve peak gradient measures 17.0 mmHg.  10. Aortic valve regurgitation is trivial.  11. The pulmonic valve was grossly normal. Pulmonic valve regurgitation is  trivial.  12. Aortic dilatation noted.  13. There is mild dilatation of the ascending aorta measuring 39 mm.  14. Normal pulmonary artery systolic pressure.  15. The inferior vena cava is normal in size with greater than 50%  respiratory variability, suggesting right atrial pressure of 3 mmHg.  16. A prior study was performed on 05/23/2015.  17. Changes from prior study are noted.  18.  Prior echo: LVEF 40%, global HK, AOV mn gradient 9 mmHg.   EKG:  EKG normal sinus rhythm, first-degree AV block at 238 ms, prominent voltage.  Recent Labs: No results found for requested labs within last 8760 hours.  Recent Lipid Panel    Component Value Date/Time   CHOL 127 06/07/2014 1018   TRIG 81.0 06/07/2014 1018   HDL 49.60 06/07/2014 1018   CHOLHDL 3 06/07/2014 1018   VLDL 16.2 06/07/2014 1018   LDLCALC 61 06/07/2014 1018    Physical Exam:    VS:  BP (!) 140/92   Pulse 66   Ht _0  (1.753 m)   Wt 202 lb 2 oz (91.7 kg)   SpO2 98%   BMI 29.85 kg/m     Wt Readings from Last 3 Encounters:  06/14/20 202 lb 2 oz (91.7 kg)  04/12/19 203 lb 6.4 oz (92.3 kg)  03/24/18 209 lb 6.4 oz (95 kg)  GEN: Overweight. No acute distress HEENT: Normal NECK: No JVD. LYMPHATICS: No lymphadenopathy CARDIAC: 1/6 systolic but no diastolic murmur. RRR S4 gallop, or edema. VASCULAR:  Normal Pulses. No bruits. RESPIRATORY:  Clear to auscultation without rales, wheezing or rhonchi  ABDOMEN: Soft, non-tender, non-distended, No pulsatile mass, MUSCULOSKELETAL: No deformity  SKIN: Warm and dry NEUROLOGIC:  Alert and oriented x 3 PSYCHIATRIC:  Normal affect   ASSESSMENT:    1. S/P AVR   2. NICM (nonischemic cardiomyopathy) ([place])   3. Essential hypertension   4. Coronary artery disease involving native coronary artery of native heart without angina pectoris   5. History of pulmonary embolism    PLAN:    In order of problems listed above:  1. Normally functioning valve as noted by above echo report.  No need to repeat echo at this time. 2. Low normal LVEF.  Continue to monitor.  Continue ACE inhibitor, diuretic, and beta-blocker.  No heart failure symptoms. 3. Target blood pressure for his age category is 140/80 mmHg or less.  We will add amlodipine 5 mg/day since diastolic pressure is higher than we would like.  Blood pressure clinic in 4 weeks. 4. Discussed secondary prevention  including the lipid lowering and blood pressure control.  Last LDL was 64 in February 2022. 5. No symptoms.  Overall education and awareness concerning primary/secondary risk prevention was discussed in detail: LDL less than 70, hemoglobin A1c less than 7, blood pressure target less than 130/80 mmHg, >150 minutes of moderate aerobic activity per week, avoidance of smoking, weight control (via diet and exercise), and continued surveillance/management of/for obstructive sleep apnea.    Medication Adjustments/Labs and Tests Ordered: Current medicines are reviewed at length with the patient today.  Concerns regarding medicines are outlined above.  Orders Placed This Encounter  Procedures  . EKG 12-Lead   No orders of the defined types were placed in this encounter.   There are no Patient Instructions on file for this visit.   Signed, Sinclair Grooms, MD  06/14/2020 10:30 AM    [place]

## 2020-06-14 ENCOUNTER — Encounter: Payer: Self-pay | Admitting: Interventional Cardiology

## 2020-06-14 ENCOUNTER — Ambulatory Visit: Payer: Medicare HMO | Admitting: Interventional Cardiology

## 2020-06-14 ENCOUNTER — Other Ambulatory Visit: Payer: Self-pay

## 2020-06-14 ENCOUNTER — Encounter: Payer: Self-pay | Admitting: *Deleted

## 2020-06-14 VITALS — BP 140/92 | HR 66 | Ht 69.0 in | Wt 202.1 lb

## 2020-06-14 DIAGNOSIS — Z86711 Personal history of pulmonary embolism: Secondary | ICD-10-CM

## 2020-06-14 DIAGNOSIS — I428 Other cardiomyopathies: Secondary | ICD-10-CM

## 2020-06-14 DIAGNOSIS — Z952 Presence of prosthetic heart valve: Secondary | ICD-10-CM

## 2020-06-14 DIAGNOSIS — I2510 Atherosclerotic heart disease of native coronary artery without angina pectoris: Secondary | ICD-10-CM

## 2020-06-14 DIAGNOSIS — I10 Essential (primary) hypertension: Secondary | ICD-10-CM

## 2020-06-14 NOTE — Patient Instructions (Signed)
Medication Instructions:  1) START Amlodipine 5mg  once daily  *If you need a refill on your cardiac medications before your next appointment, please call your pharmacy*   Lab Work: None  If you have labs (blood work) drawn today and your tests are completely normal, you will receive your results only by: Marland Kitchen MyChart Message (if you have MyChart) OR . A paper copy in the mail If you have any lab test that is abnormal or we need to change your treatment, we will call you to review the results.   Testing/Procedures: None   Follow-Up:  Your physician recommends that you schedule a follow-up appointment in: 6-8 weeks with our Hypertension Clinic.   At [facility], you and your health needs are our priority.  As part of our continuing mission to provide you with exceptional heart care, we have created designated Provider Care Teams.  These Care Teams include your primary Cardiologist (physician) and Advanced Practice Providers (APPs -  Physician Assistants and Nurse Practitioners) who all work together to provide you with the care you need, when you need it.  We recommend signing up for the patient portal called "MyChart".  Sign up information is provided on this After Visit Summary.  MyChart is used to connect with patients for Virtual Visits (Telemedicine).  Patients are able to view lab/test results, encounter notes, upcoming appointments, etc.  Non-urgent messages can be sent to your provider as well.   To learn more about what you can do with MyChart, go to [email or web address].    Your next appointment:   9-12 month(s)  The format for your next appointment:   In Person  Provider:   You may see Sinclair Grooms, MD or one of the following Advanced Practice Providers on your designated Care Team:    Kathyrn Drown, NP    Other Instructions

## 2020-06-15 ENCOUNTER — Telehealth: Payer: Self-pay | Admitting: Interventional Cardiology

## 2020-06-15 MED ORDER — AMLODIPINE BESYLATE 5 MG PO TABS: 5.0000 mg | ORAL_TABLET | Freq: Every day | ORAL | 3 refills | Status: DC

## 2020-06-15 NOTE — Telephone Encounter (Signed)
Pt c/o medication issue:  1. Name of Medication: amlodipine 5 mg  2. How are you currently taking this medication (dosage and times per day)? Has not started  3. Are you having a reaction (difficulty breathing--STAT)? no  4. What is your medication issue? Patient states he is supposed to start the new medication, but it has not been sent to the pharmacy. He states [facility] needs to [facility] to [facility] on [address]

## 2020-06-15 NOTE — Telephone Encounter (Signed)
Spoke with pt and made him aware that Amlodipine has been sent to pharmacy.  Pt appreciative for call.

## 2020-08-02 ENCOUNTER — Ambulatory Visit: Payer: Medicare HMO | Admitting: Pharmacist

## 2020-08-02 NOTE — Progress Notes (Deleted)
Patient ID: Frederick Burnett                 DOB: 10-04-1940                      MRN: [id number]     HPI: Frederick Burnett is a 80 y.o. male referred by Dr. Tamala Julian to HTN clinic. PMH is significant for bicuspid aortic valve treated with bioprosthetic AVR in 3570 complicated by L leg DVT and bilateral PE, mildly decreased EF 45-50% with LV hypertrophy on 02/2019 echo, nonobstructive CAD by cardiac cath 12/15, HTN, and HLD. Pt was last seen by Dr Tamala Julian on 06/14/20. BP was elevated at 140/92 and he was started on amlodipine 48m daily.  Any LEE? Inc amlo or lisin-hctz to full tab and recheck labs in 3 weeks Can also change metop to coreg 6.25 BID equiv dose  Current HTN meds:  amlodipine 542mdaily lisinopril-HCTZ 20-2539m 1/2 tab daily metoprolol tartrate 48m10mD  BP goal: <140/80mm75mer 06/14/20 note  Family History: Heart attack in his father; Hypertension in his brother, daughter, mother, and sister. There is no history of Stroke.  Social History: Drives school buses. Former smoker (cigars), quit in 2003, denies drug and alcohol use.  Diet:   Exercise:   Home BP readings:   Labs: 04/14/20: SCr 1.24, Na 140, K 3.9  Wt Readings from Last 3 Encounters:  06/14/20 202 lb 2 oz (91.7 kg)  04/12/19 203 lb 6.4 oz (92.3 kg)  03/24/18 209 lb 6.4 oz (95 kg)   BP Readings from Last 3 Encounters:  06/14/20 (!) 140/92  04/12/19 (!) 124/92  03/24/18 136/88   Pulse Readings from Last 3 Encounters:  06/14/20 66  04/12/19 69  03/24/18 75    Renal function: CrCl cannot be calculated (Patient's most recent lab result is older than the maximum 21 days allowed.).  Past Medical History:  Diagnosis Date  . Aortic regurgitation   . Aortic regurgitation   . Aortic root enlargement (HCC) [place] Arthritis    both shoulders - RC- wear  . Heart murmur   . History of pulmonary embolism 04/24/2014   Provoked; occurred after AVR in 2016  . HTN (hypertension)   . Hx of echocardiogram    Echo 3/16:  mild LVH, EF 40-45%, ant-septal AK, AVR with elevated velocity (3.3 m/s - slightly above expected value), mild LAE //  Echo 3/17: Mild LVH, EF 40%, diffuse HK, indeterminate diastolic function, bioprosthetic AVR well-functioning (mean gradient 9 mmHg), MAC, mild LAE, mildly reduced RVSF, mild RAE  . Hypercholesteremia     Current Outpatient Medications on File Prior to Visit  Medication Sig Dispense Refill  . amLODipine (NORVASC) 5 MG tablet Take 1 tablet (5 mg total) by mouth daily. 90 tablet 3  . aspirin EC 81 MG tablet Take 1 tablet (81 mg total) by mouth daily. 90 tablet 3  . atorvastatin (LIPITOR) 20 MG tablet TAKE 1 TABLET (20 MG TOTAL) BY MOUTH DAILY AT 6 PM. 90 tablet 3  . lisinopril-hydrochlorothiazide (ZESTORETIC) 20-25 MG tablet TAKE 1/2 TABLET BY MOUTH EVERY DAY 45 tablet 1  . metoprolol tartrate (LOPRESSOR) 25 MG tablet Take 1 tablet (25 mg total) by mouth 2 (two) times daily. 60 tablet 3  . Multiple Vitamin (MULTIVITAMIN) capsule Take 1 capsule by mouth daily.    . SHIMarland KitchenGRIX injection      No current facility-administered medications on file prior to visit.  No Known Allergies   Assessment/Plan:  1. Hypertension -

## 2020-09-06 ENCOUNTER — Other Ambulatory Visit: Payer: Self-pay | Admitting: Interventional Cardiology

## 2020-10-12 DIAGNOSIS — I10 Essential (primary) hypertension: Secondary | ICD-10-CM | POA: Diagnosis not present

## 2020-10-12 DIAGNOSIS — E7800 Pure hypercholesterolemia, unspecified: Secondary | ICD-10-CM | POA: Diagnosis not present

## 2020-10-12 DIAGNOSIS — I352 Nonrheumatic aortic (valve) stenosis with insufficiency: Secondary | ICD-10-CM | POA: Diagnosis not present

## 2020-12-09 DIAGNOSIS — Z20822 Contact with and (suspected) exposure to covid-19: Secondary | ICD-10-CM | POA: Diagnosis not present

## 2021-02-09 ENCOUNTER — Other Ambulatory Visit: Payer: Self-pay | Admitting: Interventional Cardiology

## 2021-04-18 DIAGNOSIS — Z0000 Encounter for general adult medical examination without abnormal findings: Secondary | ICD-10-CM | POA: Diagnosis not present

## 2021-04-18 DIAGNOSIS — E7800 Pure hypercholesterolemia, unspecified: Secondary | ICD-10-CM | POA: Diagnosis not present

## 2021-04-18 DIAGNOSIS — I10 Essential (primary) hypertension: Secondary | ICD-10-CM | POA: Diagnosis not present

## 2021-04-18 DIAGNOSIS — I352 Nonrheumatic aortic (valve) stenosis with insufficiency: Secondary | ICD-10-CM | POA: Diagnosis not present

## 2021-04-18 DIAGNOSIS — R42 Dizziness and giddiness: Secondary | ICD-10-CM | POA: Diagnosis not present

## 2021-04-24 NOTE — Progress Notes (Deleted)
?Cardiology Office Note:   ? ?Date:  04/24/2021  ? ?ID:  Frederick Burnett, DOB 1940/08/07, MRN [id number] ? ?PCP:  Alroy Dust, L.Marlou Sa, MD  ?Cardiologist:  Sinclair Grooms, MD  ? ?Referring MD: Alroy Dust, L.Marlou Sa, MD  ? ?No chief complaint on file. ? ? ?History of Present Illness:   ? ?Frederick Burnett is a 81 y.o. male with a hx of bicuspid aortic valve treated with bioprosthetic valve replacement in 2016, hypertension, asymptomatic left ventricular systolic dysfunction, hypertension, and hyperlipidemia. ? ? ?*** ? ?Past Medical History:  ?Diagnosis Date  ? Aortic regurgitation   ? Aortic regurgitation   ? Aortic root enlargement ([place])   ? Arthritis   ? both shoulders - RC- wear  ? Heart murmur   ? History of pulmonary embolism 04/24/2014  ? Provoked; occurred after AVR in 2016  ? HTN (hypertension)   ? Hx of echocardiogram   ? Echo 3/16: mild LVH, EF 40-45%, ant-septal AK, AVR with elevated velocity (3.3 m/s - slightly above expected value), mild LAE //  Echo 3/17: Mild LVH, EF 40%, diffuse HK, indeterminate diastolic function, bioprosthetic AVR well-functioning (mean gradient 9 mmHg), MAC, mild LAE, mildly reduced RVSF, mild RAE  ? Hypercholesteremia   ? ? ?Past Surgical History:  ?Procedure Laterality Date  ? AORTIC VALVE REPLACEMENT N/A 04/11/2014  ? Procedure: AORTIC VALVE REPLACEMENT (AVR);  Surgeon: Gaye Pollack, MD;  Location: [facility];  Service: Open Heart Surgery;  Laterality: N/A;  ? CARDIAC CATHETERIZATION    ? LEFT AND RIGHT HEART CATHETERIZATION WITH CORONARY ANGIOGRAM N/A 02/22/2014  ? Procedure: LEFT AND RIGHT HEART CATHETERIZATION WITH CORONARY ANGIOGRAM;  Surgeon: Sinclair Grooms, MD;  [facility] C[facility] LAB;  Service: Cardiovascular;  Laterality: N/A;  ? MULTIPLE TOOTH EXTRACTIONS    ? TEE WITHOUT CARDIOVERSION N/A 03/24/2014  ? Procedure: TRANSESOPHAGEAL ECHOCARDIOGRAM (TEE);  Surgeon: Dorothy Spark, MD;  Location: [place];  Service: Cardiovascular;  Laterality: N/A;  ? TEE WITHOUT CARDIOVERSION  N/A 04/11/2014  ? Procedure: TRANSESOPHAGEAL ECHOCARDIOGRAM (TEE);  Surgeon: Gaye Pollack, MD;  Location: [address];  Service: Open Heart Surgery;  Laterality: N/A;  ? ? ?Current Medications: ?No outpatient medications have been marked as taking for the 04/25/21 encounter (Appointment) with Belva Crome, MD.  ?  ? ?Allergies:   Patient has no known allergies.  ? ?Social History  ? ?Socioeconomic History  ? Marital status: Widowed  ?  Spouse name: Not on file  ? Number of children: Not on file  ? Years of education: Not on file  ? Highest education level: Not on file  ?Occupational History  ? Not on file  ?Tobacco Use  ? Smoking status: Former  ?  Types: Cigars  ?  Quit date: 05/29/2001  ?  Years since quitting: 19.9  ? Smokeless tobacco: Never  ?Substance and Sexual Activity  ? Alcohol use: No  ?  Alcohol/week: 0.0 standard drinks  ? Drug use: No  ? Sexual activity: Not on file  ?Other Topics Concern  ? Not on file  ?Social History Narrative  ? Not on file  ? ?Social Determinants of Health  ? ?Financial Resource Strain: Not on file  ?Food Insecurity: Not on file  ?Transportation Needs: Not on file  ?Physical Activity: Not on file  ?Stress: Not on file  ?Social Connections: Not on file  ?  ? ?Family History: ?The patient's family history includes Heart attack in his father; Hypertension in his brother, daughter, mother,  and sister. There is no history of Stroke. ? ?ROS:   ?Please see the history of present illness.    ?*** All other systems reviewed and are negative. ? ?EKGs/Labs/Other Studies Reviewed:   ? ?The following studies were reviewed today: ?2021 Echocardiogram: ?IMPRESSIONS  ? ? ? 1. Left ventricular ejection fraction, by visual estimation, is 45 to  ?50%. The left ventricle has mildly decreased function. Left ventricular  ?septal wall thickness was severely increased. There is severely increased  ?left ventricular hypertrophy.  ? 2. The left ventricle demonstrates global hypokinesis.  ? 3. Global right  ventricle has mildly reduced systolic function.The right  ?ventricular size is normal. No increase in right ventricular wall  ?thickness.  ? 4. Left atrial size was normal.  ? 5. Right atrial size was mildly dilated.  ? 6. The mitral valve is grossly normal. Trivial mitral valve  ?regurgitation.  ? 7. The tricuspid valve is grossly normal.  ? 8. Aortic valve mean gradient measures 9.0 mmHg.  ? 9. Aortic valve peak gradient measures 17.0 mmHg.  ?10. Aortic valve regurgitation is trivial.  ?11. The pulmonic valve was grossly normal. Pulmonic valve regurgitation is  ?trivial.  ?12. Aortic dilatation noted.  ?13. There is mild dilatation of the ascending aorta measuring 39 mm.  ?14. Normal pulmonary artery systolic pressure.  ?15. The inferior vena cava is normal in size with greater than 50%  ?respiratory variability, suggesting right atrial pressure of 3 mmHg.  ?16. A prior study was performed on 05/23/2015.  ?17. Changes from prior study are noted.  ?18. Prior echo: LVEF 40%, global HK, AOV mn gradient 9 mmHg.  ? ?EKG:  EKG *** ? ?Recent Labs: ?No results found for requested labs within last 8760 hours.  ?Recent Lipid Panel ?   ?Component Value Date/Time  ? CHOL 127 06/07/2014 1018  ? TRIG 81.0 06/07/2014 1018  ? HDL 49.60 06/07/2014 1018  ? CHOLHDL 3 06/07/2014 1018  ? VLDL 16.2 06/07/2014 1018  ? [place] 61 06/07/2014 1018  ? ? ?Physical Exam:   ? ?VS:  There were no vitals taken for this visit.   ? ?Wt Readings from Last 3 Encounters:  ?06/14/20 202 lb 2 oz (91.7 kg)  ?04/12/19 203 lb 6.4 oz (92.3 kg)  ?03/24/18 209 lb 6.4 oz (95 kg)  ?  ? ?GEN: ***. No acute distress ?HEENT: Normal ?NECK: No JVD. ?LYMPHATICS: No lymphadenopathy ?CARDIAC: *** murmur. RRR *** gallop, or edema. ?VASCULAR: *** Normal Pulses. No bruits. ?RESPIRATORY:  Clear to auscultation without rales, wheezing or rhonchi  ?ABDOMEN: Soft, non-tender, non-distended, No pulsatile mass, ?MUSCULOSKELETAL: No deformity  ?SKIN: Warm and dry ?NEUROLOGIC:   Alert and oriented x 3 ?PSYCHIATRIC:  Normal affect  ? ?ASSESSMENT:   ? ?1. Coronary artery disease involving native coronary artery of native heart without angina pectoris   ?2. S/P AVR   ?3. Essential hypertension   ?4. NICM (nonischemic cardiomyopathy) ([place])   ? ?PLAN:   ? ?In order of problems listed above: ? ?*** ? ? ?Medication Adjustments/Labs and Tests Ordered: ?Current medicines are reviewed at length with the patient today.  Concerns regarding medicines are outlined above.  ?No orders of the defined types were placed in this encounter. ? ?No orders of the defined types were placed in this encounter. ? ? ?There are no Patient Instructions on file for this visit.  ? ?Signed, ?Sinclair Grooms, MD  ?04/24/2021 6:10 PM    ?[place] ?

## 2021-04-25 ENCOUNTER — Ambulatory Visit: Payer: Medicare HMO | Admitting: Interventional Cardiology

## 2021-04-25 DIAGNOSIS — I2510 Atherosclerotic heart disease of native coronary artery without angina pectoris: Secondary | ICD-10-CM

## 2021-04-25 DIAGNOSIS — I10 Essential (primary) hypertension: Secondary | ICD-10-CM

## 2021-04-25 DIAGNOSIS — I428 Other cardiomyopathies: Secondary | ICD-10-CM

## 2021-04-25 DIAGNOSIS — Z952 Presence of prosthetic heart valve: Secondary | ICD-10-CM

## 2021-05-26 ENCOUNTER — Other Ambulatory Visit: Payer: Self-pay | Admitting: Interventional Cardiology

## 2021-06-27 ENCOUNTER — Other Ambulatory Visit: Payer: Self-pay | Admitting: Interventional Cardiology

## 2021-07-19 ENCOUNTER — Other Ambulatory Visit: Payer: Self-pay | Admitting: Interventional Cardiology

## 2021-08-03 ENCOUNTER — Other Ambulatory Visit: Payer: Self-pay | Admitting: Interventional Cardiology

## 2021-08-06 ENCOUNTER — Other Ambulatory Visit: Payer: Self-pay | Admitting: Interventional Cardiology

## 2021-08-13 ENCOUNTER — Other Ambulatory Visit: Payer: Self-pay | Admitting: Interventional Cardiology

## 2021-08-16 ENCOUNTER — Other Ambulatory Visit: Payer: Self-pay | Admitting: Interventional Cardiology

## 2021-08-16 DIAGNOSIS — H5200 Hypermetropia, unspecified eye: Secondary | ICD-10-CM | POA: Diagnosis not present

## 2021-08-16 DIAGNOSIS — Z0100 Encounter for examination of eyes and vision without abnormal findings: Secondary | ICD-10-CM | POA: Diagnosis not present

## 2021-08-16 DIAGNOSIS — I10 Essential (primary) hypertension: Secondary | ICD-10-CM | POA: Diagnosis not present

## 2021-08-20 ENCOUNTER — Other Ambulatory Visit: Payer: Self-pay | Admitting: Interventional Cardiology

## 2021-08-23 ENCOUNTER — Other Ambulatory Visit: Payer: Self-pay | Admitting: Interventional Cardiology

## 2021-08-28 ENCOUNTER — Other Ambulatory Visit: Payer: Self-pay | Admitting: Interventional Cardiology

## 2021-09-08 ENCOUNTER — Other Ambulatory Visit: Payer: Self-pay | Admitting: Interventional Cardiology

## 2021-09-13 ENCOUNTER — Other Ambulatory Visit: Payer: Self-pay | Admitting: Interventional Cardiology

## 2021-10-12 DIAGNOSIS — I352 Nonrheumatic aortic (valve) stenosis with insufficiency: Secondary | ICD-10-CM | POA: Diagnosis not present

## 2021-10-12 DIAGNOSIS — E7800 Pure hypercholesterolemia, unspecified: Secondary | ICD-10-CM | POA: Diagnosis not present

## 2021-10-12 DIAGNOSIS — I10 Essential (primary) hypertension: Secondary | ICD-10-CM | POA: Diagnosis not present

## 2021-11-23 DIAGNOSIS — L720 Epidermal cyst: Secondary | ICD-10-CM | POA: Diagnosis not present

## 2021-11-23 DIAGNOSIS — Z23 Encounter for immunization: Secondary | ICD-10-CM | POA: Diagnosis not present

## 2021-11-23 DIAGNOSIS — M79674 Pain in right toe(s): Secondary | ICD-10-CM | POA: Diagnosis not present

## 2022-04-14 NOTE — Progress Notes (Unsigned)
Cardiology Office Note:    Date:  04/17/2022   ID:  Frederick Burnett, DOB 06/07/1940, MRN [id number]  PCP:  Aurea Graff.Marlou Sa, [place] Providers Cardiologist:  Sinclair Grooms, MD (Inactive) {  Referring MD: Alroy Dust, Carlean Jews.Marlou Sa, MD    History of Present Illness:    Frederick Burnett is a 82 y.o. male with a hx of bicuspid aortic valve s/p AVR with bioprosthetic valve in 2016, HTN, HTN, HLD, and chronic systolic HF with mrEF who was previously followed by Dr. Tamala Julian who now returns to clinic for follow-up.   TTE with LVEF 45-50%, severe septal thickness, mild RV systolic dysfunction, well functioning AVR with mean gradient 19mHg.  Today, the patient states that he overall feels well. No chest pain, SOB, orthopnea, LE edema, palpitations or PND. Tolerating medications as prescribed. Blood pressure is well controlled at home. Remains active and able to work in the yard without issues.  Past Medical History:  Diagnosis Date   Aortic regurgitation    Aortic regurgitation    Aortic root enlargement (HCC)    Arthritis    both shoulders - RC- wear   Heart murmur    History of pulmonary embolism 04/24/2014   Provoked; occurred after AVR in 2016   HTN (hypertension)    Hx of echocardiogram    Echo 3/16: mild LVH, EF 40-45%, ant-septal AK, AVR with elevated velocity (3.3 m/s - slightly above expected value), mild LAE //  Echo 3/17: Mild LVH, EF 40%, diffuse HK, indeterminate diastolic function, bioprosthetic AVR well-functioning (mean gradient 9 mmHg), MAC, mild LAE, mildly reduced RVSF, mild RAE   Hypercholesteremia     Past Surgical History:  Procedure Laterality Date   AORTIC VALVE REPLACEMENT N/A 04/11/2014   Procedure: AORTIC VALVE REPLACEMENT (AVR);  Surgeon: BGaye Pollack MD;  [place]  Service: Open Heart Surgery;  Laterality: N/A;   CARDIAC CATHETERIZATION     LEFT AND RIGHT HEART CATHETERIZATION WITH CORONARY ANGIOGRAM N/A 02/22/2014   Procedure: LEFT AND  RIGHT HEART CATHETERIZATION WITH CORONARY ANGIOGRAM;  Surgeon: HSinclair Grooms MD;  [facility];  Service: Cardiovascular;  Laterality: N/A;   MULTIPLE TOOTH EXTRACTIONS     TEE WITHOUT CARDIOVERSION N/A 03/24/2014   Procedure: TRANSESOPHAGEAL ECHOCARDIOGRAM (TEE);  Surgeon: KDorothy Spark MD;  [place] [facility]  Service: Cardiovascular;  Laterality: N/A;   TEE WITHOUT CARDIOVERSION N/A 04/11/2014   Procedure: TRANSESOPHAGEAL ECHOCARDIOGRAM (TEE);  Surgeon: BGaye Pollack MD;  [place] [facility]  Service: Open Heart Surgery;  Laterality: N/A;    Current Medications: Current Meds  Medication Sig   amLODipine (NORVASC) 5 MG tablet Take 1 tablet (5 mg total) by mouth daily. Call and schedule follow up appt to receive further refills. [phone] attempt   aspirin EC 81 MG tablet Take 1 tablet (81 mg total) by mouth daily.   atorvastatin (LIPITOR) 20 MG tablet Take 1 tablet (20 mg total) by mouth daily. Please schedule appointment for future refills. 3rd and final attempt. Thank you   lisinopril-hydrochlorothiazide (ZESTORETIC) 20-25 MG tablet Take 0.5 tablets by mouth daily. Please make an appt. With Cardiologist in order to receive future refills. Thank You. 1st Attempt.   metoprolol tartrate (LOPRESSOR) 25 MG tablet Take 1 tablet (25 mg total) by mouth 2 (two) times daily.   Multiple Vitamin (MULTIVITAMIN) capsule Take 1 capsule by mouth daily.   SHINGRIX injection    spironolactone (ALDACTONE) 25 MG tablet Take 1  tablet (25 mg total) by mouth daily.     Allergies:   Patient has no known allergies.   Social History   Socioeconomic History   Marital status: Widowed    Spouse name: Not on file   Number of children: Not on file   Years of education: Not on file   Highest education level: Not on file  Occupational History   Not on file  Tobacco Use   Smoking status: Former    Types: Cigars    Quit date: 05/29/2001    Years since quitting: 20.8   Smokeless  tobacco: Never  Substance and Sexual Activity   Alcohol use: No    Alcohol/week: 0.0 standard drinks of alcohol   Drug use: No   Sexual activity: Not on file  Other Topics Concern   Not on file  Social History Narrative   Not on file   Social Determinants of Health   Financial Resource Strain: Not on file  Food Insecurity: Not on file  Transportation Needs: Not on file  Physical Activity: Not on file  Stress: Not on file  Social Connections: Not on file     Family History: The patient's family history includes Heart attack in his father; Hypertension in his brother, daughter, mother, and sister. There is no history of Stroke.  ROS:   Please see the history of present illness.     All other systems reviewed and are negative.  EKGs/Labs/Other Studies Reviewed:    The following studies were reviewed today: TTE 05-Apr-2019: IMPRESSIONS     1. Left ventricular ejection fraction, by visual estimation, is 45 to  50%. The left ventricle has mildly decreased function. Left ventricular  septal wall thickness was severely increased. There is severely increased  left ventricular hypertrophy.   2. The left ventricle demonstrates global hypokinesis.   3. Global right ventricle has mildly reduced systolic function.The right  ventricular size is normal. No increase in right ventricular wall  thickness.   4. Left atrial size was normal.   5. Right atrial size was mildly dilated.   6. The mitral valve is grossly normal. Trivial mitral valve  regurgitation.   7. The tricuspid valve is grossly normal.   8. Aortic valve mean gradient measures 9.0 mmHg.   9. Aortic valve peak gradient measures 17.0 mmHg.  10. Aortic valve regurgitation is trivial.  11. The pulmonic valve was grossly normal. Pulmonic valve regurgitation is  trivial.  12. Aortic dilatation noted.  13. There is mild dilatation of the ascending aorta measuring 39 mm.  14. Normal pulmonary artery systolic pressure.  15. The  inferior vena cava is normal in size with greater than 50%  respiratory variability, suggesting right atrial pressure of 3 mmHg.  16. A prior study was performed on 05/23/2015.  17. Changes from prior study are noted.  18. Prior echo: LVEF 40%, global HK, AOV mn gradient 9 mmHg.   EKG:  EKG is  ordered today.  The ekg ordered today demonstrates NSR with first degree AVB  Recent Labs: No results found for requested labs within last 365 days.  Recent Lipid Panel    Component Value Date/Time   CHOL 127 06/07/2014 1018   TRIG 81.0 06/07/2014 1018   HDL 49.60 06/07/2014 1018   CHOLHDL 3 06/07/2014 1018   VLDL 16.2 06/07/2014 1018   LDLCALC 61 06/07/2014 1018     Risk Assessment/Calculations:  Physical Exam:    VS:  BP 132/82   Pulse 73   Ht 5' 9"$  (1.753 m)   Wt 211 lb 6.4 oz (95.9 kg)   SpO2 95%   BMI 31.22 kg/m     Wt Readings from Last 3 Encounters:  04/17/22 211 lb 6.4 oz (95.9 kg)  06/14/20 202 lb 2 oz (91.7 kg)  04/12/19 203 lb 6.4 oz (92.3 kg)     GEN:  Well nourished, well developed in no acute distress HEENT: Normal NECK: No JVD; No carotid bruits CARDIAC: RRR, 2/6 systolic murmur RESPIRATORY:  Clear to auscultation without rales, wheezing or rhonchi  ABDOMEN: Soft, non-tender, non-distended MUSCULOSKELETAL:  No edema; No deformity  SKIN: Warm and dry NEUROLOGIC:  Alert and oriented x 3 PSYCHIATRIC:  Normal affect   ASSESSMENT:    1. Bicuspid aortic valve   2. Essential hypertension   3. Coronary artery disease involving native coronary artery of native heart without angina pectoris   4. NICM (nonischemic cardiomyopathy) ([place])   5. S/P AVR   6. Chronic systolic heart failure ([place])   7. Medication management    PLAN:    In order of problems listed above:  #Bicuspid AoV s/p AVR: Doing well and compensated on exam. Last TTE 02/2019 with EF 45-50% with global hypokinesis, well functioning AVR with mean gradient 52mHg.  -Check TTE for  monitoring -Continue ASA 849mdaily -Continue dental ppx  #Chronic Systolic HF: LVEF [id number] on exam not on diuretics. NYHA class II symptoms. -Continue metoprolol 2580mID (declined succinate as doing well on tartrate) -Continue lisinopril-HCTZ 20-59m86mily -Start spironolactone 59mg59mly -Check BMET next week  #HTN: Well controlled at home 120s mainly. -Continue amlodipine 5mg d76my -Continue lisinopril-HCTZ 20-59mg d69m -Continue metop 59mg BI41mtart spironolactone 59mg dai61m#Nonobstructive CAD: No anginal symptoms.  -Continue lipitor 20mg and 58m81mg daily38mL 55        Medication Adjustments/Labs and Tests Ordered: Current medicines are reviewed at length with the patient today.  Concerns regarding medicines are outlined above.  Orders Placed This Encounter  Procedures   Basic metabolic panel   EKG 12-Lead   [id number] COMPLETE   Meds ordered this encounter  Medications   spironolactone (ALDACTONE) 25 MG tablet    Sig: Take 1 tablet (25 mg total) by mouth daily.    Dispense:  90 tablet    Refill:  2    Patient Instructions  Medication Instructions:   START TAKING SPIRONOLACTONE 25 MG BY MOUTH DAILY  *If you need a refill on your cardiac medications before your next appointment, please call your pharmacy*   Lab Work:  ONE WEEK [place]  If you have labs (blood work) drawn today and your tests are completely normal, you will receive your results only by: MyChart [facility] MyChart) OR A paper copy in the mail If you have any lab test that is abnormal or we need to change your treatment, we will call you to review the results.   Testing/Procedures:  Your physician has requested that you have an echocardiogram. Echocardiography is a painless test that uses sound waves to create images of your heart. It provides your doctor with information about the size and shape of your heart and how well your heart's  chambers and valves are working. This procedure takes approximately one hour. There are no restrictions for this procedure. Please do NOT wear cologne, perfume, aftershave, or lotions (deodorant is allowed). Please arrive  15 minutes prior to your appointment time.    Follow-Up: At [facility], you and your health needs are our priority.  As part of our continuing mission to provide you with exceptional heart care, we have created designated Provider Care Teams.  These Care Teams include your primary Cardiologist (physician) and Advanced Practice Providers (APPs -  Physician Assistants and Nurse Practitioners) who all work together to provide you with the care you need, when you need it.  We recommend signing up for the patient portal called "MyChart".  Sign up information is provided on this After Visit Summary.  MyChart is used to connect with patients for Virtual Visits (Telemedicine).  Patients are able to view lab/test results, encounter notes, upcoming appointments, etc.  Non-urgent messages can be sent to your provider as well.   To learn more about what you can do with MyChart, go to [email or web address].    Your next appointment:   6 month(s)  Provider:   DR. Johney Frame     Signed, Freada Bergeron, MD  04/17/2022 4:41 PM    [place]

## 2022-04-17 ENCOUNTER — Ambulatory Visit: Payer: Medicare HMO | Attending: Cardiology | Admitting: Cardiology

## 2022-04-17 ENCOUNTER — Encounter: Payer: Self-pay | Admitting: Cardiology

## 2022-04-17 VITALS — BP 132/82 | HR 73 | Ht 69.0 in | Wt 211.4 lb

## 2022-04-17 DIAGNOSIS — Z79899 Other long term (current) drug therapy: Secondary | ICD-10-CM

## 2022-04-17 DIAGNOSIS — Z952 Presence of prosthetic heart valve: Secondary | ICD-10-CM | POA: Diagnosis not present

## 2022-04-17 DIAGNOSIS — Q231 Congenital insufficiency of aortic valve: Secondary | ICD-10-CM | POA: Diagnosis not present

## 2022-04-17 DIAGNOSIS — I2510 Atherosclerotic heart disease of native coronary artery without angina pectoris: Secondary | ICD-10-CM | POA: Diagnosis not present

## 2022-04-17 DIAGNOSIS — I428 Other cardiomyopathies: Secondary | ICD-10-CM | POA: Diagnosis not present

## 2022-04-17 DIAGNOSIS — I5022 Chronic systolic (congestive) heart failure: Secondary | ICD-10-CM

## 2022-04-17 DIAGNOSIS — I10 Essential (primary) hypertension: Secondary | ICD-10-CM

## 2022-04-17 MED ORDER — SPIRONOLACTONE 25 MG PO TABS: 25.0000 mg | ORAL_TABLET | Freq: Every day | ORAL | 2 refills | Status: DC

## 2022-04-17 NOTE — Patient Instructions (Signed)
Medication Instructions:   START TAKING SPIRONOLACTONE 25 MG BY MOUTH DAILY  *If you need a refill on your cardiac medications before your next appointment, please call your pharmacy*   Lab Work:  [place] OFFICE--BMET  If you have labs (blood work) drawn today and your tests are completely normal, you will receive your results only by: [place] (if you have MyChart) OR A paper copy in the mail If you have any lab test that is abnormal or we need to change your treatment, we will call you to review the results.   Testing/Procedures:  Your physician has requested that you have an echocardiogram. Echocardiography is a painless test that uses sound waves to create images of your heart. It provides your doctor with information about the size and shape of your heart and how well your heart's chambers and valves are working. This procedure takes approximately one hour. There are no restrictions for this procedure. Please do NOT wear cologne, perfume, aftershave, or lotions (deodorant is allowed). Please arrive 15 minutes prior to your appointment time.    Follow-Up: At [facility], you and your health needs are our priority.  As part of our continuing mission to provide you with exceptional heart care, we have created designated Provider Care Teams.  These Care Teams include your primary Cardiologist (physician) and Advanced Practice Providers (APPs -  Physician Assistants and Nurse Practitioners) who all work together to provide you with the care you need, when you need it.  We recommend signing up for the patient portal called "MyChart".  Sign up information is provided on this After Visit Summary.  MyChart is used to connect with patients for Virtual Visits (Telemedicine).  Patients are able to view lab/test results, encounter notes, upcoming appointments, etc.  Non-urgent messages can be sent to your provider as well.   To learn more about what you can do with  MyChart, go to [email or web address].    Your next appointment:   6 month(s)  Provider:   DR. Johney Frame

## 2022-04-24 ENCOUNTER — Ambulatory Visit: Payer: Medicare HMO | Attending: Cardiology

## 2022-04-24 DIAGNOSIS — Z952 Presence of prosthetic heart valve: Secondary | ICD-10-CM

## 2022-04-24 DIAGNOSIS — E7800 Pure hypercholesterolemia, unspecified: Secondary | ICD-10-CM | POA: Diagnosis not present

## 2022-04-24 DIAGNOSIS — I428 Other cardiomyopathies: Secondary | ICD-10-CM | POA: Diagnosis not present

## 2022-04-24 DIAGNOSIS — I5022 Chronic systolic (congestive) heart failure: Secondary | ICD-10-CM

## 2022-04-24 DIAGNOSIS — I10 Essential (primary) hypertension: Secondary | ICD-10-CM | POA: Diagnosis not present

## 2022-04-24 DIAGNOSIS — Z79899 Other long term (current) drug therapy: Secondary | ICD-10-CM | POA: Diagnosis not present

## 2022-04-24 DIAGNOSIS — Z1211 Encounter for screening for malignant neoplasm of colon: Secondary | ICD-10-CM | POA: Diagnosis not present

## 2022-04-24 DIAGNOSIS — Z23 Encounter for immunization: Secondary | ICD-10-CM | POA: Diagnosis not present

## 2022-04-24 DIAGNOSIS — I429 Cardiomyopathy, unspecified: Secondary | ICD-10-CM | POA: Diagnosis not present

## 2022-04-24 DIAGNOSIS — I2510 Atherosclerotic heart disease of native coronary artery without angina pectoris: Secondary | ICD-10-CM | POA: Diagnosis not present

## 2022-04-24 DIAGNOSIS — Z0000 Encounter for general adult medical examination without abnormal findings: Secondary | ICD-10-CM | POA: Diagnosis not present

## 2022-04-24 DIAGNOSIS — Q231 Congenital insufficiency of aortic valve: Secondary | ICD-10-CM

## 2022-04-24 DIAGNOSIS — Z9181 History of falling: Secondary | ICD-10-CM | POA: Diagnosis not present

## 2022-04-24 DIAGNOSIS — D171 Benign lipomatous neoplasm of skin and subcutaneous tissue of trunk: Secondary | ICD-10-CM | POA: Diagnosis not present

## 2022-04-24 DIAGNOSIS — I352 Nonrheumatic aortic (valve) stenosis with insufficiency: Secondary | ICD-10-CM | POA: Diagnosis not present

## 2022-04-25 ENCOUNTER — Other Ambulatory Visit: Payer: Self-pay | Admitting: Family Medicine

## 2022-04-25 DIAGNOSIS — D171 Benign lipomatous neoplasm of skin and subcutaneous tissue of trunk: Secondary | ICD-10-CM

## 2022-04-25 LAB — BASIC METABOLIC PANEL
BUN/Creatinine Ratio: 10 (ref 10–24)
BUN: 13 mg/dL (ref 8–27)
CO2: 22 mmol/L (ref 20–29)
Calcium: 9.5 mg/dL (ref 8.6–10.2)
Chloride: 103 mmol/L (ref 96–106)
Creatinine, Ser: 1.24 mg/dL (ref 0.76–1.27)
Glucose: 77 mg/dL (ref 70–99)
Potassium: 4.3 mmol/L (ref 3.5–5.2)
Sodium: 140 mmol/L (ref 134–144)
eGFR: 58 mL/min/{1.73_m2} — ABNORMAL LOW (ref >59)

## 2022-05-14 ENCOUNTER — Ambulatory Visit (HOSPITAL_COMMUNITY): Payer: Medicare HMO | Attending: Internal Medicine

## 2022-05-14 DIAGNOSIS — Z952 Presence of prosthetic heart valve: Secondary | ICD-10-CM | POA: Diagnosis not present

## 2022-05-15 ENCOUNTER — Telehealth: Payer: Self-pay | Admitting: *Deleted

## 2022-05-15 DIAGNOSIS — I340 Nonrheumatic mitral (valve) insufficiency: Secondary | ICD-10-CM

## 2022-05-15 DIAGNOSIS — Z952 Presence of prosthetic heart valve: Secondary | ICD-10-CM

## 2022-05-15 LAB — ECHOCARDIOGRAM COMPLETE
AV Mean grad: 9.8 mmHg
AV Peak grad: 14.9 mmHg
Ao pk vel: 1.93 m/s
Area-P 1/2: 2.99 cm2
S' Lateral: 2.7 cm

## 2022-05-15 NOTE — Telephone Encounter (Signed)
-----   Message from Freada Bergeron, MD sent at 05/15/2022  1:50 PM EDT ----- His echo shows normal pumping function and normal functioning aortic valve prosthesis. There is mild leakiness of the mitral valve. Overall, this is stable from prior. Will repeat echo for monitoring in 1-2 years.

## 2022-05-15 NOTE — Telephone Encounter (Signed)
The patient has been notified of the result and verbalized understanding.  All questions (if any) were answered.  Pt aware that we will order for him to get a repeat echo in one year for surveillance of his valves.   He is aware that I will place the order in the system and send a message to our Echo Scheduler to call him back to arrange this appt for that time.   Pt verbalized understanding and agrees with this plan.

## 2022-05-20 DIAGNOSIS — Z1211 Encounter for screening for malignant neoplasm of colon: Secondary | ICD-10-CM | POA: Diagnosis not present

## 2022-07-30 DIAGNOSIS — Z86010 Personal history of colonic polyps: Secondary | ICD-10-CM | POA: Diagnosis not present

## 2022-07-30 DIAGNOSIS — R195 Other fecal abnormalities: Secondary | ICD-10-CM | POA: Diagnosis not present

## 2022-09-03 DIAGNOSIS — D124 Benign neoplasm of descending colon: Secondary | ICD-10-CM | POA: Diagnosis not present

## 2022-09-03 DIAGNOSIS — Z86010 Personal history of colonic polyps: Secondary | ICD-10-CM | POA: Diagnosis not present

## 2022-09-03 DIAGNOSIS — R195 Other fecal abnormalities: Secondary | ICD-10-CM | POA: Diagnosis not present

## 2022-09-03 DIAGNOSIS — D175 Benign lipomatous neoplasm of intra-abdominal organs: Secondary | ICD-10-CM | POA: Diagnosis not present

## 2022-09-03 DIAGNOSIS — K648 Other hemorrhoids: Secondary | ICD-10-CM | POA: Diagnosis not present

## 2022-09-04 DIAGNOSIS — D124 Benign neoplasm of descending colon: Secondary | ICD-10-CM | POA: Diagnosis not present

## 2022-10-04 ENCOUNTER — Ambulatory Visit: Payer: Medicare HMO | Admitting: Cardiology

## 2022-10-07 ENCOUNTER — Ambulatory Visit: Payer: Self-pay | Admitting: General Surgery

## 2022-10-07 ENCOUNTER — Telehealth: Payer: Self-pay | Admitting: *Deleted

## 2022-10-07 DIAGNOSIS — I7789 Other specified disorders of arteries and arterioles: Secondary | ICD-10-CM | POA: Diagnosis not present

## 2022-10-07 DIAGNOSIS — D124 Benign neoplasm of descending colon: Secondary | ICD-10-CM | POA: Diagnosis not present

## 2022-10-07 DIAGNOSIS — K6389 Other specified diseases of intestine: Secondary | ICD-10-CM

## 2022-10-07 NOTE — H&P (Signed)
REFERRING PHYSICIAN:  Benita Stabile, MD  PROVIDER:  Elenora Gamma, MD  MRN: [id number] DOB: 08-13-1940 DATE OF ENCOUNTER: 10/07/2022  Subjective   Chief Complaint: New Consultation     History of Present Illness: Frederick Burnett is a 82 y.o. male who is seen today as an office consultation at the request of Dr. Clovis Riley for evaluation of New Consultation .  82 year old male with cardiac disease who presents to the office after being evaluated for anemia and heme positive stool.  He underwent a colonoscopy with Dr. Bosie Clos and a descending colon mass was found.  Area was tattooed. 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  He is here today to discuss surgery.  He lives an active lifestyle and gardens on a daily basis.  He has no history of abdominal surgery.   Review of Systems: A complete review of systems was obtained from the patient.  I have reviewed this information and discussed as appropriate with the patient.  See HPI as well for other ROS.   Medical History: Past Medical History:  Diagnosis Date   CAD (coronary artery disease)    CHF (congestive heart failure) (CMS/HHS-HCC)    Hypertension     There is no problem list on file for this patient.   Past Surgical History:  Procedure Laterality Date   VASCULAR SURGERY       No Known Allergies  Current Outpatient Medications on File Prior to Visit  Medication Sig Dispense Refill   amLODIPine (NORVASC) 5 MG tablet Take 5 mg by mouth once daily     atorvastatin (LIPITOR) 20 MG tablet Take 20 mg by mouth once daily     GENTLE LAXATIVE, BISACODYL, 5 mg EC tablet as directed     lisinopriL-hydroCHLOROthiazide (ZESTORETIC) 10-12.5 mg tablet      metoprolol tartrate (LOPRESSOR) 25 MG tablet take 1 tablet by mouth twice a day with food for 90 days     spironolactone (ALDACTONE) 25 MG tablet Take 25 mg by mouth once daily     No current facility-administered medications on  file prior to visit.    Family History  Problem Relation Age of Onset   Obesity Brother    Coronary Artery Disease (Blocked arteries around heart) Brother      Social History   Tobacco Use  Smoking Status Never  Smokeless Tobacco Never     Social History   Socioeconomic History   Marital status: Widowed  Tobacco Use   Smoking status: Never   Smokeless tobacco: Never  Substance and Sexual Activity   Alcohol use: Never   Drug use: Never    Objective:    Vitals:   10/07/22 1421 10/07/22 1422  BP: 120/88   Pulse: 90   Temp: 36.6 C (97.8 F)   SpO2: 97%   Weight: 88 kg (194 lb)   Height: 175.3 cm (5\' 9" )   PainSc:  0-No pain  PainLoc:  Abdomen     Exam Gen: NAD Abd: soft    Labs, Imaging and Diagnostic Testing:   Assessment and Plan:  Diagnoses and all orders for this visit:  Adenomatous polyp of descending colon -     CT abdomen pelvis with contrast; Future -     polyethylene glycol (MIRALAX) powder; Take 233.75 g by mouth once for 1 dose Take according to your procedure prep instructions. -     bisacodyL (DULCOLAX) 5 mg EC tablet; Take 4 tablets (20 mg total) by mouth once  daily as needed for Constipation for up to 1 dose -     metroNIDAZOLE (FLAGYL) 500 MG tablet; Take 2 tablets (1,000 mg total) by mouth 3 (three) times daily for 3 doses Take according to your procedure colon prep instructions -     neomycin 500 mg tablet; Take 2 tablets (1,000 mg total) by mouth 3 (three) times daily for 3 doses Take according to your procedure colon prep instructions     Will get a CT scan of the abdomen and pelvis to evaluate for surgical planning.  Will discuss his risk with his cardiologist.  Once this is complete, I recommend proceeding with a robotic assisted partial colectomy.  We will remove the tumor and associated lymph nodes and assumption that there is a possible underlying malignancy. The surgery and anatomy were described to the patient as well as the  risks of surgery and the possible complications.  These include: Bleeding, deep abdominal infections and possible wound complications such as hernia and infection, damage to adjacent structures, leak of surgical connections, which can lead to other surgeries and possibly an ostomy, possible need for other procedures, such as abscess drains in radiology, possible prolonged hospital stay, possible diarrhea from removal of part of the colon, possible constipation from narcotics, possible bowel, bladder or sexual dysfunction if having rectal surgery, prolonged fatigue/weakness or appetite loss, possible early recurrence of of disease, possible complications of their medical problems such as heart disease or arrhythmias or lung problems, death (less than 1%). I believe the patient understands and wishes to proceed with the surgery.   Vanita Panda, MD Colon and Rectal Surgery [facility]

## 2022-10-07 NOTE — Telephone Encounter (Signed)
   Pre-operative Risk Assessment    Patient Name: Frederick Burnett  DOB: Jan 06, 1941 MRN: [id number]      Request for Surgical Clearance    Procedure:   COLON SURGERY  Date of Surgery:  Clearance TBD                                 Surgeon:  DR. Romie Levee Surgeon's Group or Practice Name:  [facility] Phone number:  [phone] Fax number:  [phone] ATTN: Michel Bickers, LPN   Type of Clearance Requested:   - Medical ; ASA    Type of Anesthesia:  General    Additional requests/questions:    Elpidio Anis   10/07/2022, 5:08 PM  3

## 2022-10-08 ENCOUNTER — Other Ambulatory Visit (HOSPITAL_COMMUNITY): Payer: Self-pay | Admitting: General Surgery

## 2022-10-08 DIAGNOSIS — D124 Benign neoplasm of descending colon: Secondary | ICD-10-CM

## 2022-10-08 NOTE — Telephone Encounter (Signed)
Pt has been scheduled 10/15/22 with Dr. Elease Hashimoto for pre op clearance. Previous pt of Dr. Katrinka Blazing. Pt has appt 11/2022 with Dr. Elease Hashimoto for 6 month f/u. Pt agreed not to cancel 11/2022 appt until he d/w Dr. Elease Hashimoto. If MD feels can cancel 11/2022 he will have his do so. Pt thanked me for the help. I will update all parties involved.

## 2022-10-08 NOTE — Telephone Encounter (Signed)
   Name: Frederick Burnett  DOB: [birth year]  MRN: [id number]  Primary Cardiologist: Lesleigh Noe, MD (Inactive)  Chart reviewed as part of pre-operative protocol coverage. Because of Frederick Burnett's past medical history and time since last visit, he will require a follow-up in-office visit in order to better assess preoperative cardiovascular risk.  Pre-op covering staff: - Please schedule appointment and call patient to inform them. If patient already had an upcoming appointment within acceptable timeframe, please add "pre-op clearance" to the appointment notes so provider is aware. - Please contact requesting surgeon's office via preferred method (i.e, phone, fax) to inform them of need for appointment prior to surgery.  She can hold ASA as long as asymptomatic during their appointment.  Sharlene Dory, PA-C  10/08/2022, 8:22 AM

## 2022-10-13 ENCOUNTER — Encounter: Payer: Self-pay | Admitting: Cardiovascular Disease

## 2022-10-13 NOTE — Progress Notes (Signed)
  Cardiology Office Note:  .   Date:  10/13/2022  ID:  Frederick Burnett, DOB [birth year], MRN [id number] PCP: [facility]  [facility] Providers Cardiologist:  previous Katrinka Blazing,  now Orrin Yurkovich  Click to update primary MD,subspecialty MD or APP then REFRESH:1}   History of Present Illness: .   Frederick Burnett is a 82 y.o. male of bicuspid AV, s/p AVR with bioprosthetic AV in 2016, HTN, HLD , chronic systolic CHF  Previously seen by Dr. Katrinka Blazing,   He was recently found to have a colonic mass  Needs pre op evaluation prior to surgery    He was last seen by Dr. Shari Prows in Feb. 2024 Denied any chest pain  BP has been well controlled.      ROS: ***  Studies Reviewed: .        *** Risk Assessment/Calculations:   {Does this patient have ATRIAL FIBRILLATION?:[id number]} No BP recorded.  {Refresh Note OR Click here to enter BP  :1}***       Physical Exam:   VS:  There were no vitals taken for this visit.   Wt Readings from Last 3 Encounters:  04/17/22 211 lb 6.4 oz (95.9 kg)  06/14/20 202 lb 2 oz (91.7 kg)  04/12/19 203 lb 6.4 oz (92.3 kg)    GEN: Well nourished, well developed in no acute distress NECK: No JVD; No carotid bruits CARDIAC: ***RRR, no murmurs, rubs, gallops RESPIRATORY:  Clear to auscultation without rales, wheezing or rhonchi  ABDOMEN: Soft, non-tender, non-distended EXTREMITIES:  No edema; No deformity   ASSESSMENT AND PLAN: .   ***    {Are you ordering a CV Procedure (e.g. stress test, cath, DCCV, TEE, etc)?   Press F2        :[id number]}  Dispo: ***  Signed, Kristeen Miss, MD

## 2022-10-14 ENCOUNTER — Inpatient Hospital Stay (HOSPITAL_COMMUNITY)
Admission: EM | Admit: 2022-10-14 | Discharge: 2022-10-18 | DRG: 176 | Disposition: A | Discharge status: Home / Self Care | Payer: Medicare HMO | Attending: Family Medicine | Admitting: Family Medicine

## 2022-10-14 ENCOUNTER — Encounter (HOSPITAL_COMMUNITY): Payer: Self-pay

## 2022-10-14 ENCOUNTER — Ambulatory Visit (HOSPITAL_COMMUNITY)
Admission: RE | Admit: 2022-10-14 | Discharge: 2022-10-14 | Disposition: A | Discharge status: Home / Self Care | Payer: Medicare HMO | Source: Ambulatory Visit | Attending: General Surgery | Admitting: General Surgery

## 2022-10-14 ENCOUNTER — Other Ambulatory Visit: Payer: Self-pay

## 2022-10-14 DIAGNOSIS — I2699 Other pulmonary embolism without acute cor pulmonale: Secondary | ICD-10-CM | POA: Diagnosis not present

## 2022-10-14 DIAGNOSIS — D124 Benign neoplasm of descending colon: Secondary | ICD-10-CM | POA: Insufficient documentation

## 2022-10-14 DIAGNOSIS — E7800 Pure hypercholesterolemia, unspecified: Secondary | ICD-10-CM | POA: Diagnosis present

## 2022-10-14 DIAGNOSIS — K639 Disease of intestine, unspecified: Secondary | ICD-10-CM | POA: Diagnosis present

## 2022-10-14 DIAGNOSIS — Z86711 Personal history of pulmonary embolism: Secondary | ICD-10-CM

## 2022-10-14 DIAGNOSIS — Z955 Presence of coronary angioplasty implant and graft: Secondary | ICD-10-CM

## 2022-10-14 DIAGNOSIS — I10 Essential (primary) hypertension: Secondary | ICD-10-CM | POA: Diagnosis present

## 2022-10-14 DIAGNOSIS — I2510 Atherosclerotic heart disease of native coronary artery without angina pectoris: Secondary | ICD-10-CM | POA: Diagnosis present

## 2022-10-14 DIAGNOSIS — D62 Acute posthemorrhagic anemia: Secondary | ICD-10-CM | POA: Diagnosis not present

## 2022-10-14 DIAGNOSIS — R918 Other nonspecific abnormal finding of lung field: Secondary | ICD-10-CM | POA: Diagnosis not present

## 2022-10-14 DIAGNOSIS — Z8249 Family history of ischemic heart disease and other diseases of the circulatory system: Secondary | ICD-10-CM

## 2022-10-14 DIAGNOSIS — Z953 Presence of xenogenic heart valve: Secondary | ICD-10-CM

## 2022-10-14 DIAGNOSIS — I2694 Multiple subsegmental pulmonary emboli without acute cor pulmonale: Secondary | ICD-10-CM | POA: Diagnosis not present

## 2022-10-14 DIAGNOSIS — I7121 Aneurysm of the ascending aorta, without rupture: Secondary | ICD-10-CM | POA: Diagnosis not present

## 2022-10-14 DIAGNOSIS — I441 Atrioventricular block, second degree: Secondary | ICD-10-CM | POA: Diagnosis present

## 2022-10-14 DIAGNOSIS — Z79899 Other long term (current) drug therapy: Secondary | ICD-10-CM

## 2022-10-14 DIAGNOSIS — Z87891 Personal history of nicotine dependence: Secondary | ICD-10-CM

## 2022-10-14 DIAGNOSIS — N400 Enlarged prostate without lower urinary tract symptoms: Secondary | ICD-10-CM | POA: Diagnosis not present

## 2022-10-14 DIAGNOSIS — E785 Hyperlipidemia, unspecified: Secondary | ICD-10-CM | POA: Insufficient documentation

## 2022-10-14 DIAGNOSIS — Z86718 Personal history of other venous thrombosis and embolism: Secondary | ICD-10-CM

## 2022-10-14 DIAGNOSIS — Z7982 Long term (current) use of aspirin: Secondary | ICD-10-CM

## 2022-10-14 DIAGNOSIS — K8020 Calculus of gallbladder without cholecystitis without obstruction: Secondary | ICD-10-CM | POA: Diagnosis not present

## 2022-10-14 DIAGNOSIS — Z952 Presence of prosthetic heart valve: Secondary | ICD-10-CM

## 2022-10-14 LAB — CBC WITH DIFFERENTIAL/PLATELET
Abs Immature Granulocytes: 0.01 10*3/uL (ref 0.00–0.07)
Basophils Absolute: 0 10*3/uL (ref 0.0–0.1)
Basophils Relative: 1 %
Eosinophils Absolute: 0.2 10*3/uL (ref 0.0–0.5)
Eosinophils Relative: 4 %
HCT: 41.1 % (ref 39.0–52.0)
Hemoglobin: 13.1 g/dL (ref 13.0–17.0)
Immature Granulocytes: 0 %
Lymphocytes Relative: 29 %
Lymphs Abs: 1.8 10*3/uL (ref 0.7–4.0)
MCH: 28 pg (ref 26.0–34.0)
MCHC: 31.9 g/dL (ref 30.0–36.0)
MCV: 87.8 fL (ref 80.0–100.0)
Monocytes Absolute: 0.5 10*3/uL (ref 0.1–1.0)
Monocytes Relative: 9 %
Neutro Abs: 3.6 10*3/uL (ref 1.7–7.7)
Neutrophils Relative %: 57 %
Platelets: 177 10*3/uL (ref 150–400)
RBC: 4.68 MIL/uL (ref 4.22–5.81)
RDW: 13.5 % (ref 11.5–15.5)
WBC: 6.2 10*3/uL (ref 4.0–10.5)
nRBC: 0 % (ref 0.0–0.2)

## 2022-10-14 LAB — COMPREHENSIVE METABOLIC PANEL
ALT: 24 U/L (ref 0–44)
AST: 53 U/L — ABNORMAL HIGH (ref 15–41)
Albumin: 4.2 g/dL (ref 3.5–5.0)
Alkaline Phosphatase: 100 U/L (ref 38–126)
Anion gap: 11 (ref 5–15)
BUN: 17 mg/dL (ref 8–23)
CO2: 20 mmol/L — ABNORMAL LOW (ref 22–32)
Calcium: 9 mg/dL (ref 8.9–10.3)
Chloride: 105 mmol/L (ref 98–111)
Creatinine, Ser: 1.49 mg/dL — ABNORMAL HIGH (ref 0.61–1.24)
GFR, Estimated: 47 mL/min — ABNORMAL LOW (ref >60)
Glucose, Bld: 92 mg/dL (ref 70–99)
Potassium: 5.9 mmol/L — ABNORMAL HIGH (ref 3.5–5.1)
Sodium: 136 mmol/L (ref 135–145)
Total Bilirubin: 1.6 mg/dL — ABNORMAL HIGH (ref 0.3–1.2)
Total Protein: 7.9 g/dL (ref 6.5–8.1)

## 2022-10-14 MED ORDER — IOHEXOL 350 MG/ML SOLN
75.0000 mL | Freq: Once | INTRAVENOUS | Status: AC | PRN
  Administered 2022-10-14: 75 mL via INTRAVENOUS

## 2022-10-14 NOTE — ED Provider Triage Note (Signed)
Emergency Medicine Provider Triage Evaluation Note  PEDRO HOCKER , a 82 y.o. male  was evaluated in triage.  Pt states he was sent here for evaluation of a possible blood clot.  He got a CT abdomen pelvis earlier which stated he might have a possible clot in his lungs.  He denies any shortness of breath, chest pain.  Review of Systems  Positive: Above Negative: As above  Physical Exam  BP (!) 112/90 (BP Location: Right Arm)   Pulse 86   Temp 98.3 F (36.8 C) (Oral)   Resp 19   Ht 5\' 8"  (1.727 m)   Wt 95.7 kg   SpO2 100%   BMI 32.08 kg/m  Gen:   Awake, no distress   Resp:  Normal effort  MSK:   Moves extremities without difficulty  Other:  Regular rate and rhythm on cardiac auscultation, lungs clear to auscultation bilaterally  Medical Decision Making  Medically screening exam initiated at 9:42 PM.  Appropriate orders placed.  AMISH DEFENBAUGH was informed that the remainder of the evaluation will be completed by another provider, this initial triage assessment does not replace that evaluation, and the importance of remaining in the ED until their evaluation is complete.     Arabella Merles, PA-C 10/14/22 2144

## 2022-10-14 NOTE — ED Triage Notes (Signed)
Pt arrived from home via POV s/p CT scan today with result of possible PE. MD sent to ED for further evaluation.

## 2022-10-15 ENCOUNTER — Inpatient Hospital Stay (HOSPITAL_COMMUNITY): Payer: Medicare HMO

## 2022-10-15 ENCOUNTER — Other Ambulatory Visit: Payer: Self-pay

## 2022-10-15 ENCOUNTER — Ambulatory Visit: Payer: Medicare HMO | Attending: Cardiovascular Disease | Admitting: Cardiovascular Disease

## 2022-10-15 ENCOUNTER — Encounter (HOSPITAL_COMMUNITY): Payer: Self-pay | Admitting: Internal Medicine

## 2022-10-15 ENCOUNTER — Encounter: Payer: Self-pay | Admitting: Cardiovascular Disease

## 2022-10-15 ENCOUNTER — Emergency Department (HOSPITAL_COMMUNITY): Payer: Medicare HMO

## 2022-10-15 DIAGNOSIS — Z952 Presence of prosthetic heart valve: Secondary | ICD-10-CM | POA: Diagnosis not present

## 2022-10-15 DIAGNOSIS — I2602 Saddle embolus of pulmonary artery with acute cor pulmonale: Secondary | ICD-10-CM | POA: Diagnosis not present

## 2022-10-15 DIAGNOSIS — Z953 Presence of xenogenic heart valve: Secondary | ICD-10-CM | POA: Diagnosis not present

## 2022-10-15 DIAGNOSIS — Z955 Presence of coronary angioplasty implant and graft: Secondary | ICD-10-CM | POA: Diagnosis not present

## 2022-10-15 DIAGNOSIS — Z87891 Personal history of nicotine dependence: Secondary | ICD-10-CM | POA: Diagnosis not present

## 2022-10-15 DIAGNOSIS — Z79899 Other long term (current) drug therapy: Secondary | ICD-10-CM | POA: Diagnosis not present

## 2022-10-15 DIAGNOSIS — I441 Atrioventricular block, second degree: Secondary | ICD-10-CM | POA: Diagnosis not present

## 2022-10-15 DIAGNOSIS — K639 Disease of intestine, unspecified: Secondary | ICD-10-CM | POA: Diagnosis present

## 2022-10-15 DIAGNOSIS — R918 Other nonspecific abnormal finding of lung field: Secondary | ICD-10-CM | POA: Diagnosis not present

## 2022-10-15 DIAGNOSIS — Z8249 Family history of ischemic heart disease and other diseases of the circulatory system: Secondary | ICD-10-CM | POA: Diagnosis not present

## 2022-10-15 DIAGNOSIS — I10 Essential (primary) hypertension: Secondary | ICD-10-CM

## 2022-10-15 DIAGNOSIS — Z86711 Personal history of pulmonary embolism: Secondary | ICD-10-CM

## 2022-10-15 DIAGNOSIS — D62 Acute posthemorrhagic anemia: Secondary | ICD-10-CM | POA: Diagnosis not present

## 2022-10-15 DIAGNOSIS — Z86718 Personal history of other venous thrombosis and embolism: Secondary | ICD-10-CM | POA: Diagnosis not present

## 2022-10-15 DIAGNOSIS — I7121 Aneurysm of the ascending aorta, without rupture: Secondary | ICD-10-CM | POA: Diagnosis not present

## 2022-10-15 DIAGNOSIS — E7800 Pure hypercholesterolemia, unspecified: Secondary | ICD-10-CM | POA: Diagnosis not present

## 2022-10-15 DIAGNOSIS — E785 Hyperlipidemia, unspecified: Secondary | ICD-10-CM | POA: Insufficient documentation

## 2022-10-15 DIAGNOSIS — Z7982 Long term (current) use of aspirin: Secondary | ICD-10-CM | POA: Diagnosis not present

## 2022-10-15 DIAGNOSIS — I2699 Other pulmonary embolism without acute cor pulmonale: Secondary | ICD-10-CM | POA: Diagnosis not present

## 2022-10-15 DIAGNOSIS — I2510 Atherosclerotic heart disease of native coronary artery without angina pectoris: Secondary | ICD-10-CM | POA: Diagnosis not present

## 2022-10-15 LAB — ECHOCARDIOGRAM COMPLETE
AR max vel: 1.26 cm2
AV Area VTI: 1.1 cm2
AV Area mean vel: 1.17 cm2
AV Mean grad: 11 mmHg
AV Peak grad: 17.3 mmHg
Ao pk vel: 2.08 m/s
Area-P 1/2: 3.58 cm2
Height: 68 in
S' Lateral: 2.7 cm
Weight: 3375.68 oz

## 2022-10-15 LAB — HEPARIN LEVEL (UNFRACTIONATED): Heparin Unfractionated: >1.1 [IU]/mL — ABNORMAL HIGH (ref 0.30–0.70)

## 2022-10-15 LAB — BRAIN NATRIURETIC PEPTIDE: B Natriuretic Peptide: 42.5 pg/mL (ref 0.0–100.0)

## 2022-10-15 LAB — TROPONIN I (HIGH SENSITIVITY)
Troponin I (High Sensitivity): 8 ng/L (ref <18)
Troponin I (High Sensitivity): 10 ng/L (ref <18)

## 2022-10-15 LAB — POTASSIUM: Potassium: 3.6 mmol/L (ref 3.5–5.1)

## 2022-10-15 LAB — I-STAT CREATININE, ED: Creatinine, Ser: 1.5 mg/dL — ABNORMAL HIGH (ref 0.61–1.24)

## 2022-10-15 MED ORDER — HEPARIN (PORCINE) 25000 UT/250ML-% IV SOLN
1250.0000 [IU]/h | INTRAVENOUS | Status: DC
  Administered 2022-10-16 (×2): 1050 [IU]/h via INTRAVENOUS
  Administered 2022-10-17: 1250 [IU]/h via INTRAVENOUS
  Filled 2022-10-15 (×3): qty 250

## 2022-10-15 MED ORDER — ATORVASTATIN CALCIUM 10 MG PO TABS
20.0000 mg | ORAL_TABLET | Freq: Every day | ORAL | Status: DC
  Administered 2022-10-15 – 2022-10-18 (×4): 20 mg via ORAL
  Filled 2022-10-15 (×4): qty 2

## 2022-10-15 MED ORDER — HEPARIN (PORCINE) 25000 UT/250ML-% IV SOLN
1450.0000 [IU]/h | INTRAVENOUS | Status: DC
  Administered 2022-10-15: 1450 [IU]/h via INTRAVENOUS
  Filled 2022-10-15: qty 250

## 2022-10-15 MED ORDER — HEPARIN BOLUS VIA INFUSION
6000.0000 [IU] | Freq: Once | INTRAVENOUS | Status: AC
  Administered 2022-10-15: 6000 [IU] via INTRAVENOUS
  Filled 2022-10-15: qty 6000

## 2022-10-15 MED ORDER — IOHEXOL 350 MG/ML SOLN
75.0000 mL | Freq: Once | INTRAVENOUS | Status: AC | PRN
  Administered 2022-10-15: 75 mL via INTRAVENOUS

## 2022-10-15 NOTE — ED Notes (Signed)
ED TO INPATIENT HANDOFF REPORT  ED Nurse Name and Phone #: Farley Ly [phone]  S Name/Age/Gender Frederick Burnett 82 y.o. male Room/Bed: 002C/002C  Code Status   Code Status: Full Code  Home/SNF/Other Home Patient oriented to: self, place, time, and situation Is this baseline? Yes   Triage Complete: Triage complete  Chief Complaint Acute pulmonary embolism (HCC) [I26.99]  Triage Note Pt arrived from home via POV s/p CT scan today with result of possible PE. MD sent to ED for further evaluation.    Allergies No Known Allergies  Level of Care/Admitting Diagnosis ED Disposition     ED Disposition  Admit   Condition  --   Comment  Hospital Area: [facility] [[id number]]  Level of Care: Progressive [102]  Admit to Progressive based on following criteria: CARDIOVASCULAR & THORACIC of moderate stability with acute coronary syndrome symptoms/low risk myocardial infarction/hypertensive urgency/arrhythmias/heart failure potentially compromising stability and stable post cardiovascular intervention patients.  May admit patient to [facility] or [facility] if equivalent level of care is available:: No  Covid Evaluation: Asymptomatic - no recent exposure (last 10 days) testing not required  Diagnosis: Acute pulmonary embolism [facility]) [[id number]]  Admitting Physician: Joycelyn Das [[id number]]  Attending Physician: Joycelyn Das [[id number]]  Certification:: I certify this patient will need inpatient services for at least 2 midnights  Expected Medical Readiness: 10/17/2022          B Medical/Surgery History Past Medical History:  Diagnosis Date   Aortic regurgitation    Aortic regurgitation    Aortic root enlargement (HCC)    Arthritis    both shoulders - RC- wear   Heart murmur    History of pulmonary embolism 04/24/2014   Provoked; occurred after AVR in 2016   HTN (hypertension)    Hx of echocardiogram    Echo 3/16: mild LVH, EF 40-45%, ant-septal AK, AVR with  elevated velocity (3.3 m/s - slightly above expected value), mild LAE //  Echo 3/17: Mild LVH, EF 40%, diffuse HK, indeterminate diastolic function, bioprosthetic AVR well-functioning (mean gradient 9 mmHg), MAC, mild LAE, mildly reduced RVSF, mild RAE   Hypercholesteremia    Past Surgical History:  Procedure Laterality Date   AORTIC VALVE REPLACEMENT N/A 04/11/2014   Procedure: AORTIC VALVE REPLACEMENT (AVR);  Surgeon: Alleen Borne, MD;  Location: [facility] OR;  Service: Open Heart Surgery;  Laterality: N/A;   CARDIAC CATHETERIZATION     LEFT AND RIGHT HEART CATHETERIZATION WITH CORONARY ANGIOGRAM N/A 02/22/2014   Procedure: LEFT AND RIGHT HEART CATHETERIZATION WITH CORONARY ANGIOGRAM;  Surgeon: Lesleigh Noe, MD;  Location: [facility] CATH LAB;  Service: Cardiovascular;  Laterality: N/A;   MULTIPLE TOOTH EXTRACTIONS     TEE WITHOUT CARDIOVERSION N/A 03/24/2014   Procedure: TRANSESOPHAGEAL ECHOCARDIOGRAM (TEE);  Surgeon: Lars Masson, MD;  Location: [facility] ENDOSCOPY;  Service: Cardiovascular;  Laterality: N/A;   TEE WITHOUT CARDIOVERSION N/A 04/11/2014   Procedure: TRANSESOPHAGEAL ECHOCARDIOGRAM (TEE);  Surgeon: Alleen Borne, MD;  Location: [facility] OR;  Service: Open Heart Surgery;  Laterality: N/A;     A IV Location/Drains/Wounds Patient Lines/Drains/Airways Status     Active Line/Drains/Airways     Name Placement date Placement time Site Days   Peripheral IV 10/15/22 20 G Anterior;Distal;Left Forearm 10/15/22  0447  Forearm  less than 1            Intake/Output Last 24 hours No intake or output data in the 24 hours ending 10/15/22 2043  Labs/Imaging Results  for orders placed or performed during the hospital encounter of 10/14/22 (from the past 48 hour(s))  CBC with Differential     Status: None   Collection Time: 10/14/22 10:15 PM  Result Value Ref Range   WBC 6.2 4.0 - 10.5 K/uL   RBC 4.68 4.22 - 5.81 MIL/uL   Hemoglobin 13.1 13.0 - 17.0 g/dL   HCT 62.1 30.8 - 65.7 %   MCV 87.8 80.0  - 100.0 fL   MCH 28.0 26.0 - 34.0 pg   MCHC 31.9 30.0 - 36.0 g/dL   RDW 84.6 96.2 - 95.2 %   Platelets 177 150 - 400 K/uL   nRBC 0.0 0.0 - 0.2 %   Neutrophils Relative % 57 %   Neutro Abs 3.6 1.7 - 7.7 K/uL   Lymphocytes Relative 29 %   Lymphs Abs 1.8 0.7 - 4.0 K/uL   Monocytes Relative 9 %   Monocytes Absolute 0.5 0.1 - 1.0 K/uL   Eosinophils Relative 4 %   Eosinophils Absolute 0.2 0.0 - 0.5 K/uL   Basophils Relative 1 %   Basophils Absolute 0.0 0.0 - 0.1 K/uL   Immature Granulocytes 0 %   Abs Immature Granulocytes 0.01 0.00 - 0.07 K/uL    Comment: Performed at [facility], [address] [place], [place] [address]  Comprehensive metabolic panel     Status: Abnormal   Collection Time: 10/14/22 10:15 PM  Result Value Ref Range   Sodium 136 135 - 145 mmol/L   Potassium 5.9 (H) 3.5 - 5.1 mmol/L    Comment: HEMOLYSIS AT THIS LEVEL MAY AFFECT RESULT   Chloride 105 98 - 111 mmol/L   CO2 20 (L) 22 - 32 mmol/L   Glucose, Bld 92 70 - 99 mg/dL    Comment: Glucose reference range applies only to samples taken after fasting for at least 8 hours.   BUN 17 8 - 23 mg/dL   Creatinine, Ser 4.40 (H) 0.61 - 1.24 mg/dL   Calcium 9.0 8.9 - 10.2 mg/dL   Total Protein 7.9 6.5 - 8.1 g/dL   Albumin 4.2 3.5 - 5.0 g/dL   AST 53 (H) 15 - 41 U/L    Comment: HEMOLYSIS AT THIS LEVEL MAY AFFECT RESULT   ALT 24 0 - 44 U/L    Comment: HEMOLYSIS AT THIS LEVEL MAY AFFECT RESULT   Alkaline Phosphatase 100 38 - 126 U/L    Comment: HEMOLYSIS AT THIS LEVEL MAY AFFECT RESULT   Total Bilirubin 1.6 (H) 0.3 - 1.2 mg/dL    Comment: HEMOLYSIS AT THIS LEVEL MAY AFFECT RESULT   GFR, Estimated 47 (L) >60 mL/min    Comment: (NOTE) Calculated using the CKD-EPI Creatinine Equation (2021)    Anion gap 11 5 - 15    Comment: Performed at [facility], [address], [place], [place] [address]  I-stat Creatinine, ED     Status: Abnormal   Collection Time: 10/14/22 11:25 PM  Result Value Ref Range    Creatinine, Ser 1.50 (H) 0.61 - 1.24 mg/dL  Troponin I (High Sensitivity)     Status: None   Collection Time: 10/15/22  5:10 AM  Result Value Ref Range   Troponin I (High Sensitivity) 8 <18 ng/L    Comment: (NOTE) Elevated high sensitivity troponin I (hsTnI) values and significant  changes across serial measurements may suggest ACS but many other  chronic and acute conditions are known to elevate hsTnI results.  Refer to the "Links" section  for chest pain algorithms and additional  guidance. Performed at [facility], [address] [place], [place] [address]   Brain natriuretic peptide     Status: None   Collection Time: 10/15/22  5:10 AM  Result Value Ref Range   B Natriuretic Peptide 42.5 0.0 - 100.0 pg/mL    Comment: Performed at [facility] Lab, [address], [place], [place] [address]  Potassium     Status: None   Collection Time: 10/15/22  5:10 AM  Result Value Ref Range   Potassium 3.6 3.5 - 5.1 mmol/L    Comment: Performed at [facility], [address], [place], [place] [address]  Troponin I (High Sensitivity)     Status: None   Collection Time: 10/15/22  6:37 AM  Result Value Ref Range   Troponin I (High Sensitivity) 10 <18 ng/L    Comment: (NOTE) Elevated high sensitivity troponin I (hsTnI) values and significant  changes across serial measurements may suggest ACS but many other  chronic and acute conditions are known to elevate hsTnI results.  Refer to the "Links" section for chest pain algorithms and additional  guidance. Performed at [facility], [address] [place], [place] [address]   Heparin level (unfractionated)     Status: Abnormal   Collection Time: 10/15/22  4:15 PM  Result Value Ref Range   Heparin Unfractionated >1.10 (H) 0.30 - 0.70 IU/mL    Comment: (NOTE) The clinical reportable range upper limit is being lowered to >1.10 to align with the FDA approved guidance for the current laboratory assay.  If heparin results  are below expected values, and patient dosage has  been confirmed, suggest follow up testing of antithrombin III levels. Performed at [facility], [address], [place], [place] [address]    ECHOCARDIOGRAM COMPLETE  Result Date: 10/15/2022    ECHOCARDIOGRAM REPORT   Patient Name:   Frederick Burnett Date of Exam: 10/15/2022 Medical Rec #:  [id number]      Height:       68.0 in Accession #:    [id number]     Weight:       211.0 lb Date of Birth:  06-20-1940      BSA:          2.091 m Patient Age:    82 years       BP:           123/84 mmHg Patient Gender: M              HR:           80 bpm. Exam Location:  Inpatient Procedure: 2D Echo, Color Doppler and Cardiac Doppler Indications:    I26.02 Pulmonary embolus  History:        Patient has prior history of Echocardiogram examinations, most                 recent 05/15/2022. Risk Factors:Hypertension.                 Aortic Valve: 23 mm Magna valve is present in the aortic                 position. Procedure Date: 04/11/14.  Sonographer:    Irving Burton Senior RDCS Referring Phys: [phone] MATTHEW R HUNSUCKER IMPRESSIONS  1. Left ventricular ejection fraction, by estimation, is 60 to 65%. The left ventricle has normal function. The left ventricle has no regional wall motion abnormalities.  There is severe left ventricular hypertrophy of the basal-septal segment. Left ventricular diastolic parameters are consistent with Grade I diastolic dysfunction (impaired relaxation).  2. Right ventricular systolic function is normal. The right ventricular size is mildly enlarged. There is normal pulmonary artery systolic pressure. The estimated right ventricular systolic pressure is 28.2 mmHg.  3. The mitral valve is normal in structure. Trivial mitral valve regurgitation. No evidence of mitral stenosis.  4. The aortic valve has been repaired/replaced Aortic valve regurgitation is not visualized. There is a 23 mm Magna valve present in the aortic position. Procedure Date: 04/11/14.  Aortic valve area, by VTI measures 1.10 cm. Aortic valve mean gradient measures 11.0 mmHg. Aortic valve Vmax measures 2.08 m/s.  5. Aortic dilatation noted. There is moderate dilatation of the aortic root, measuring 48 mm.  6. The inferior vena cava is normal in size with greater than 50% respiratory variability, suggesting right atrial pressure of 3 mmHg.  7. Ascending aorta measurements are within normal limits for age when indexed to body surface area. FINDINGS  Left Ventricle: Left ventricular ejection fraction, by estimation, is 60 to 65%. The left ventricle has normal function. The left ventricle has no regional wall motion abnormalities. The left ventricular internal cavity size was normal in size. There is  severe left ventricular hypertrophy of the basal-septal segment. Left ventricular diastolic parameters are consistent with Grade I diastolic dysfunction (impaired relaxation). Normal left ventricular filling pressure. Right Ventricle: The right ventricular size is mildly enlarged. No increase in right ventricular wall thickness. Right ventricular systolic function is normal. There is normal pulmonary artery systolic pressure. The tricuspid regurgitant velocity is 2.51  m/s, and with an assumed right atrial pressure of 3 mmHg, the estimated right ventricular systolic pressure is 28.2 mmHg. Left Atrium: Left atrial size was normal in size. Right Atrium: Right atrial size was normal in size. Pericardium: There is no evidence of pericardial effusion. Mitral Valve: The mitral valve is normal in structure. Trivial mitral valve regurgitation. No evidence of mitral valve stenosis. Tricuspid Valve: The tricuspid valve is normal in structure. Tricuspid valve regurgitation is mild . No evidence of tricuspid stenosis. Aortic Valve: The aortic valve has been repaired/replaced. Aortic valve regurgitation is not visualized. Aortic valve mean gradient measures 11.0 mmHg. Aortic valve peak gradient measures 17.3 mmHg.  Aortic valve area, by VTI measures 1.10 cm. There is a  23 mm Magna valve present in the aortic position. Procedure Date: 04/11/14. Pulmonic Valve: The pulmonic valve was normal in structure. Pulmonic valve regurgitation is not visualized. No evidence of pulmonic stenosis. Aorta: Aortic dilatation noted. Ascending aorta measurements are within normal limits for age when indexed to body surface area. There is moderate dilatation of the aortic root, measuring 48 mm. Venous: The inferior vena cava is normal in size with greater than 50% respiratory variability, suggesting right atrial pressure of 3 mmHg. IAS/Shunts: No atrial level shunt detected by color flow Doppler.  LEFT VENTRICLE PLAX 2D LVIDd:         3.70 cm   Diastology LVIDs:         2.70 cm   LV e' medial:    6.74 cm/s LV PW:         1.30 cm   LV E/e' medial:  7.2 LV IVS:        2.12 cm   LV e' lateral:   8.49 cm/s LVOT diam:     2.10 cm   LV E/e' lateral: 5.7 LV SV:  45 LV SV Index:   21 LVOT Area:     3.46 cm  RIGHT VENTRICLE RV S prime:     11.10 cm/s TAPSE (M-mode): 2.1 cm LEFT ATRIUM             Index        RIGHT ATRIUM           Index LA diam:        3.00 cm 1.43 cm/m   RA Area:     17.40 cm LA Vol (A2C):   53.7 ml 25.69 ml/m  RA Volume:   45.90 ml  21.95 ml/m LA Vol (A4C):   53.0 ml 25.35 ml/m LA Biplane Vol: 55.0 ml 26.31 ml/m  AORTIC VALVE AV Area (Vmax):    1.26 cm AV Area (Vmean):   1.17 cm AV Area (VTI):     1.10 cm AV Vmax:           208.00 cm/s AV Vmean:          163.000 cm/s AV VTI:            0.406 m AV Peak Grad:      17.3 mmHg AV Mean Grad:      11.0 mmHg LVOT Vmax:         75.50 cm/s LVOT Vmean:        54.850 cm/s LVOT VTI:          0.130 m LVOT/AV VTI ratio: 0.32  AORTA Ao Root diam: 3.20 cm Ao Asc diam:  4.30 cm MITRAL VALVE               TRICUSPID VALVE MV Area (PHT): 3.58 cm    TR Peak grad:   25.2 mmHg MV Decel Time: 212 msec    TR Vmax:        251.00 cm/s MV E velocity: 48.50 cm/s MV A velocity: 77.50 cm/s  SHUNTS  MV E/A ratio:  0.63        Systemic VTI:  0.13 m                            Systemic Diam: 2.10 cm Armanda Magic MD Electronically signed by Armanda Magic MD Signature Date/Time: 10/15/2022/10:06:01 AM    Final    CT Angio Chest PE W and/or Wo Contrast  Result Date: 10/15/2022 CLINICAL DATA:  82 year old male with filling defect in a pulmonary artery branch on prior CT of the abdomen and pelvis. Follow-up study to assess for pulmonary embolism. EXAM: CT ANGIOGRAPHY CHEST WITH CONTRAST TECHNIQUE: Multidetector CT imaging of the chest was performed using the standard protocol during bolus administration of intravenous contrast. Multiplanar CT image reconstructions and MIPs were obtained to evaluate the vascular anatomy. RADIATION DOSE REDUCTION: This exam was performed according to the departmental dose-optimization program which includes automated exposure control, adjustment of the mA and/or kV according to patient size and/or use of iterative reconstruction technique. CONTRAST:  75mL OMNIPAQUE IOHEXOL 350 MG/ML SOLN COMPARISON:  Chest CTA 04/24/2014. CT of the abdomen and pelvis 10/14/2022. FINDINGS: Cardiovascular: There are multiple filling defects within pulmonary artery branches in the lungs bilaterally indicative of pulmonary embolism. There is extensive clot bilaterally, with the largest burden in the right lower lobe distribution involving the right lower lobe pulmonary artery and all segmental branches of the right lower lobe basal segments, many of which appear completely occlusive. Additional filling defects are noted on the left involving both left  upper and lower lobe distributions, both occlusive and nonocclusive. There is also a potential filling defect in the right ventricle best appreciated on axial images 215 and 216 of series 7, concerning for thrombus. Estimated right ventricular diameter of 54 mm. Estimated left ventricular diameter of 28 mm. RV to LV ratio of 1.93. There is no significant  pericardial fluid, thickening or pericardial calcification. There is aortic atherosclerosis, as well as atherosclerosis of the great vessels of the mediastinum and the coronary arteries, including calcified atherosclerotic plaque in the left main, left anterior descending, left circumflex and right coronary arteries. Status post median sternotomy for aortic valve replacement with what appears to be a stented bioprosthesis. Aneurysmal dilatation of the ascending aorta above the anastomosis of the graft estimated to measure 4.9 cm in diameter. Mediastinum/Nodes: No pathologically enlarged mediastinal or hilar lymph nodes. Small hiatal hernia. No axillary lymphadenopathy. Lungs/Pleura: No acute consolidative airspace disease. No pleural effusions. Scattered small pulmonary nodules, largest of which is in the posterior aspect of the right lower lobe (axial image 95 of series 6) measuring 6 mm. Upper Abdomen: Aortic atherosclerosis. Musculoskeletal: Median sternotomy wires. There are no aggressive appearing lytic or blastic lesions noted in the visualized portions of the skeleton. Review of the MIP images confirms the above findings. IMPRESSION: 1. Study is positive for large burden of occlusive and nonocclusive pulmonary embolism in the lungs bilaterally, probable clot in the right ventricle and CT evidence of right heart strain (RV/LV Ratio = 1.93) consistent with at least submassive (intermediate risk) PE. The presence of right heart strain has been associated with an increased risk of morbidity and mortality. Please refer to the "Code PE Focused" order set in EPIC. 2. Small pulmonary nodules measuring 6 mm or less in size. Non-contrast chest CT at 6 months is recommended. If the nodules are stable at time of repeat CT, then future CT at 18-24 months (from today's scan) is considered optional for low-risk patients, but is recommended for high-risk patients. This recommendation follows the consensus statement:  Guidelines for Management of Incidental Pulmonary Nodules Detected on CT Images: From the Fleischner Society 2017; Radiology 2017; 284:228-243. 3. Aortic atherosclerosis, in addition to left main and three-vessel coronary artery disease. Please note that although the presence of coronary artery calcium documents the presence of coronary artery disease, the severity of this disease and any potential stenosis cannot be assessed on this non-gated CT examination. Assessment for potential risk factor modification, dietary therapy or pharmacologic therapy may be warranted, if clinically indicated. 4. Aneurysmal dilatation of the ascending thoracic aorta (4.9 cm in diameter). Recommend semi-annual imaging followup by CTA or MRA and referral to cardiothoracic surgery if not already obtained. This recommendation follows 2010 ACCF/AHA/AATS/ACR/ASA/SCA/SCAI/SIR/STS/SVM Guidelines for the Diagnosis and Management of Patients With Thoracic Aortic Disease. Circulation. 2010; 121: B147-W295. Aortic aneurysm NOS (ICD10-I71.9) Critical Value/emergent results were called by telephone at the time of interpretation on 10/15/2022 at 5:37 am to provider [facility], who verbally acknowledged these results. Aortic Atherosclerosis (ICD10-I70.0). Electronically Signed   By: Trudie Reed M.D.   On: 10/15/2022 05:39   CT ABDOMEN PELVIS W CONTRAST  Result Date: 10/14/2022 CLINICAL DATA:  Descending colon mass.  * Tracking Code: BO * EXAM: CT ABDOMEN AND PELVIS WITH CONTRAST TECHNIQUE: Multidetector CT imaging of the abdomen and pelvis was performed using the standard protocol following bolus administration of intravenous contrast. RADIATION DOSE REDUCTION: This exam was performed according to the departmental dose-optimization program which includes automated exposure control, adjustment of the  mA and/or kV according to patient size and/or use of iterative reconstruction technique. CONTRAST:  75mL OMNIPAQUE IOHEXOL 350 MG/ML SOLN  COMPARISON:  CTA chest dated 04/16/2014, 01/05/2013 FINDINGS: Lower chest: Apparent central filling defect within right lower lobe posterior segmental pulmonary artery (3:3). No pleural effusion or pneumothorax demonstrated. Partially imaged heart size is normal. Coronary artery calcification. Hepatobiliary: No focal hepatic lesions. No intra or extrahepatic biliary ductal dilation. Cholelithiasis. Pancreas: No focal lesions or main ductal dilation. Spleen: Normal in size without focal abnormality. Adrenals/Urinary Tract: 1.7 cm right adrenal nodule (3:26) measures 105 HU and is unchanged from 01/05/2013, likely adenoma. No specific follow-up imaging recommended. No left adrenal nodule. No suspicious renal mass, calculi or hydronephrosis. No focal bladder wall thickening. Stomach/Bowel: Normal appearance of the stomach. Mildly featureless appearance of the splenic flexure with suspected mural thickening (3:21) may correspond to known descending colon mass. Normal appendix. Vascular/Lymphatic: Aortic atherosclerosis. No enlarged abdominal or pelvic lymph nodes. Reproductive: Enlargement of the prostate with median lobe hypertrophy. Other: No free fluid, fluid collection, or free air. Musculoskeletal: No acute or abnormal lytic or blastic osseous lesions. Multilevel degenerative changes of the partially imaged thoracic and lumbar spine. Grade 1 anterolisthesis at L3-4 and grade 1 retrolisthesis at L5-S1. Small fat-containing paraumbilical and bilateral inguinal hernias code:Marland Kitchen IMPRESSION: 1. Apparent central filling defect within right lower lobe posterior segmental pulmonary artery, which may represent a pulmonary embolism. Recommend correlation with PE protocol CTA chest. 2. Mildly featureless appearance of the splenic flexure with suspected mural thickening may correspond to known descending colon mass. 3. No evidence of metastatic disease in the abdomen or pelvis. 4. Cholelithiasis. 5. Enlargement of the prostate  with median lobe hypertrophy. 6.  Aortic Atherosclerosis (ICD10-I70.0). Critical Value/emergent results were called by telephone at the time of interpretation on 10/14/2022 at 7:04 pm to provider Abigail Miyamoto, who verbally acknowledged these results. Electronically Signed   By: Agustin Cree M.D.   On: 10/14/2022 19:12    Pending Labs Unresulted Labs (From admission, onward)     Start     Ordered   10/16/22 0500  CBC  Daily,   R      10/15/22 0550   10/16/22 0500  Basic metabolic panel  Tomorrow morning,   R        10/15/22 0951   10/16/22 0500  Magnesium  Tomorrow morning,   R        10/15/22 0951   10/16/22 0230  Heparin level (unfractionated)  Once-Timed,   TIMED        10/15/22 1756            Vitals/Pain Today's Vitals   10/15/22 1915 10/15/22 1930 10/15/22 1945 10/15/22 2000  BP: 113/74 104/72 110/76 101/72  Pulse: 100 99 93 (!) 104  Resp: (!) 26 (!) 24 (!) 21 (!) 23  Temp:      TempSrc:      SpO2: 100% 100% 100% 100%  Weight:      Height:      PainSc:        Isolation Precautions No active isolations  Medications Medications  atorvastatin (LIPITOR) tablet 20 mg (20 mg Oral Given 10/15/22 1044)  heparin ADULT infusion 100 units/mL (25000 units/234mL) (1,250 Units/hr Intravenous Rate/Dose Change 10/15/22 1829)  iohexol (OMNIPAQUE) 350 MG/ML injection 75 mL (75 mLs Intravenous Contrast Given 10/15/22 0458)  heparin bolus via infusion 6,000 Units (6,000 Units Intravenous Bolus from Bag 10/15/22 0609)    Mobility walks with device  R Recommendations: See Admitting Provider Note  Report given to:   Additional Notes:

## 2022-10-15 NOTE — Plan of Care (Signed)

## 2022-10-15 NOTE — ED Notes (Signed)
ECHO at bedside.

## 2022-10-15 NOTE — Progress Notes (Signed)
Brief pulmonary note:  Chart reviewed - admitted for acute pulmonary embolism. Troponin and BNP not elevated. No evidence of RV strain. Lower PESI score on admission. Continue heparin with transition to oral anticoagulation at discharge. Suspect this is provoked in the setting of colonic mass - probable malignancy. Would continue at least 3-6 months AC and further for as long as he has active cancer and is able to tolerate.   Durel Salts, MD Pulmonary and Critical Care Medicine [facility] 10/15/2022 11:55 AM Pager: see AMION  If no response to pager, please call critical care on call (see AMION) until 7pm After 7:00 pm call Elink

## 2022-10-15 NOTE — Consult Note (Signed)
NAME:  Frederick Burnett, MRN:  [id number], DOB:  [birth year], LOS: 0 ADMISSION DATE:  10/14/2022, CONSULTATION DATE:  10/15/22 REFERRING MD:  ED, CHIEF COMPLAINT:  abnormal ct scan as outpatient   History of Present Illness:  82 year old man with recent colon mass sent to ED after outpatient scan of the abdomen pelvis revealed pulmonary embolism.  CTA was obtained.  There is a pulmonary embolism with large clot burden.  Reported increased RV size.  BNP and troponin normal.  He has prior echocardiogram that showed chronic right-sided changes.  For what ever reason recent 04/2022 did not comment on these changes.  He is asymptomatic.  On room air.  Not tachycardic.  Satting 100%.  Pertinent  Medical History  Colon mass  Significant Hospital Events: Including procedures, antibiotic start and stop dates in addition to other pertinent events   8/19 sent to ED for CTA with positive PE on CT abdomen pelvis with contrast for possible colon mass workup  Interim History / Subjective:    Objective   Blood pressure 119/76, pulse 73, temperature 97.9 F (36.6 C), resp. rate 17, height 5\' 8"  (1.727 m), weight 95.7 kg, SpO2 100%.       No intake or output data in the 24 hours ending 10/15/22 5643 Filed Weights   10/14/22 2107  Weight: 95.7 kg    Examination: General: Elderly thin lying in bed HENT: Moist mucous membranes EOMI Lungs: Clear, normal breathing on room air Cardiovascular: Regular rate and rhythm, no murmur Abdomen: Nondistended, bowel sounds present Extremities: No edema Neuro: Moves all extremities  Resolved Hospital Problem list     Assessment & Plan:  Presumed submassive PE: Based on CT images with large clot burden.  Initial troponin normal.  BNP pending.  Curiously recent TTE 04/2022 relatively normal.  On previous echocardiogram she shown signs of RV changes right atrial dilation, chronic right-sided issues.  He is on room air.  He is not tachycardic although he does  prescribed a beta-blocker.  He is normotensive. -- Recommend heparin -- Stat TTE, follow-up BMP results -please contact pulmonary consult pager once TTE results to evaluate for echocardiographic evidence of submassive PE, initial biomarkers negative, troponin  Recommend admission to progressive via TRH at this time.  Can reevaluate additional interventions based on echocardiogram result.  Best Practice (right click and "Reselect all SmartList Selections" daily)   Per primary  Labs   CBC: Recent Labs  Lab 10/14/22 2215  WBC 6.2  NEUTROABS 3.6  HGB 13.1  HCT 41.1  MCV 87.8  PLT 177    Basic Metabolic Panel: Recent Labs  Lab 10/14/22 2215 10/15/22 0510  NA 136  --   K 5.9* 3.6  CL 105  --   CO2 20*  --   GLUCOSE 92  --   BUN 17  --   CREATININE 1.49*  --   CALCIUM 9.0  --    GFR: Estimated Creatinine Clearance: 42.9 mL/min (A) (by C-G formula based on SCr of 1.49 mg/dL (H)). Recent Labs  Lab 10/14/22 2215  WBC 6.2    Liver Function Tests: Recent Labs  Lab 10/14/22 2215  AST 53*  ALT 24  ALKPHOS 100  BILITOT 1.6*  PROT 7.9  ALBUMIN 4.2   No results for input(s): "LIPASE", "AMYLASE" in the last 168 hours. No results for input(s): "AMMONIA" in the last 168 hours.  ABG    Component Value Date/Time   PHART 7.323 (L) 04/11/2014 1648   PCO2ART 48.4 (H)  04/11/2014 1648   PO2ART 96.0 04/11/2014 1648   HCO3 25.2 (H) 04/11/2014 1648   TCO2 20 04/24/2014 1448   ACIDBASEDEF 1.0 04/11/2014 1648   O2SAT 97.0 04/11/2014 1648     Coagulation Profile: No results for input(s): "INR", "PROTIME" in the last 168 hours.  Cardiac Enzymes: No results for input(s): "CKTOTAL", "CKMB", "CKMBINDEX", "TROPONINI" in the last 168 hours.  HbA1C: Hgb A1c MFr Bld  Date/Time Value Ref Range Status  04/25/2014 03:05 AM 5.7 (H) 4.8 - 5.6 % Final    Comment:    (NOTE)         Pre-diabetes: 5.7 - 6.4         Diabetes: >6.4         Glycemic control for adults with diabetes:  <7.0   04/07/2014 10:57 AM 5.7 (H) 4.8 - 5.6 % Final    Comment:    (NOTE)         Pre-diabetes: 5.7 - 6.4         Diabetes: >6.4         Glycemic control for adults with diabetes: <7.0     CBG: No results for input(s): "GLUCAP" in the last 168 hours.  Review of Systems:   No chest pain with exertion orthopnea or PND.  Comprehensive review of systems otherwise negative.  Past Medical History:  He,  has a past medical history of Aortic regurgitation, Aortic regurgitation, Aortic root enlargement (HCC), Arthritis, Heart murmur, History of pulmonary embolism (04/24/2014), HTN (hypertension), echocardiogram, and Hypercholesteremia.   Surgical History:   Past Surgical History:  Procedure Laterality Date   AORTIC VALVE REPLACEMENT N/A 04/11/2014   Procedure: AORTIC VALVE REPLACEMENT (AVR);  Surgeon: Alleen Borne, MD;  Location: [facility] OR;  Service: Open Heart Surgery;  Laterality: N/A;   CARDIAC CATHETERIZATION     LEFT AND RIGHT HEART CATHETERIZATION WITH CORONARY ANGIOGRAM N/A 02/22/2014   Procedure: LEFT AND RIGHT HEART CATHETERIZATION WITH CORONARY ANGIOGRAM;  Surgeon: Lesleigh Noe, MD;  Location: [facility] CATH LAB;  Service: Cardiovascular;  Laterality: N/A;   MULTIPLE TOOTH EXTRACTIONS     TEE WITHOUT CARDIOVERSION N/A 03/24/2014   Procedure: TRANSESOPHAGEAL ECHOCARDIOGRAM (TEE);  Surgeon: Lars Masson, MD;  Location: [facility] ENDOSCOPY;  Service: Cardiovascular;  Laterality: N/A;   TEE WITHOUT CARDIOVERSION N/A 04/11/2014   Procedure: TRANSESOPHAGEAL ECHOCARDIOGRAM (TEE);  Surgeon: Alleen Borne, MD;  Location: [facility] OR;  Service: Open Heart Surgery;  Laterality: N/A;     Social History:   reports that he quit smoking about 21 years ago. His smoking use included cigars. He has never used smokeless tobacco. He reports that he does not drink alcohol and does not use drugs.   Family History:  His family history includes Heart attack in his father; Hypertension in his brother, daughter,  mother, and sister. There is no history of Stroke.   Allergies No Known Allergies   Home Medications  Prior to Admission medications   Medication Sig Start Date End Date Taking? Authorizing Provider  amLODipine (NORVASC) 5 MG tablet Take 1 tablet (5 mg total) by mouth daily. Call and schedule follow up appt to receive further refills. [phone] 3rd attempt 08/16/21   Lyn Records, MD  aspirin EC 81 MG tablet Take 1 tablet (81 mg total) by mouth daily. 04/12/19   Rosalio Macadamia, NP  atorvastatin (LIPITOR) 20 MG tablet Take 1 tablet (20 mg total) by mouth daily. Please schedule appointment for future refills. 3rd and final attempt. Thank  you 08/13/21   Lyn Records, MD  lisinopril-hydrochlorothiazide (ZESTORETIC) 20-25 MG tablet Take 0.5 tablets by mouth daily. Please make an appt. With Cardiologist in order to receive future refills. Thank You. 1st Attempt. 08/20/21   Lyn Records, MD  metoprolol tartrate (LOPRESSOR) 25 MG tablet Take 1 tablet (25 mg total) by mouth 2 (two) times daily. 04/15/14   Barrett, Erin R, PA-C  Multiple Vitamin (MULTIVITAMIN) capsule Take 1 capsule by mouth daily.    [provider]  [facility] injection  04/14/20   [provider]  spironolactone (ALDACTONE) 25 MG tablet Take 1 tablet (25 mg total) by mouth daily. 04/17/22   Meriam Sprague, MD     Critical care time: n/a

## 2022-10-15 NOTE — Progress Notes (Signed)
ANTICOAGULATION CONSULT NOTE - Follow-up Note  Pharmacy Consult for Heparin Indication: pulmonary embolus  No Known Allergies  Patient Measurements: Height: 5\' 8"  (172.7 cm) Weight: 95.7 kg (210 lb 15.7 oz) IBW/kg (Calculated) : 68.4 Heparin Dosing Weight: 88.6 kg  Vital Signs: Temp: 97.9 F (36.6 C) (08/20 1545) Temp Source: Oral (08/20 1545) BP: 110/83 (08/20 1545) Pulse Rate: 78 (08/20 1545)  Labs: Recent Labs    10/14/22 2215 10/14/22 2325 10/15/22 0510 10/15/22 0637 10/15/22 1615  HGB 13.1  --   --   --   --   HCT 41.1  --   --   --   --   PLT 177  --   --   --   --   HEPARINUNFRC  --   --   --   --  >1.10*  CREATININE 1.49* 1.50*  --   --   --   TROPONINIHS  --   --  8 10  --     Estimated Creatinine Clearance: 42.6 mL/min (A) (by C-G formula based on SCr of 1.5 mg/dL (H)).   Medical History: Past Medical History:  Diagnosis Date   Aortic regurgitation    Aortic regurgitation    Aortic root enlargement (HCC)    Arthritis    both shoulders - RC- wear   Heart murmur    History of pulmonary embolism 04/24/2014   Provoked; occurred after AVR in 2016   HTN (hypertension)    Hx of echocardiogram    Echo 3/16: mild LVH, EF 40-45%, ant-septal AK, AVR with elevated velocity (3.3 m/s - slightly above expected value), mild LAE //  Echo 3/17: Mild LVH, EF 40%, diffuse HK, indeterminate diastolic function, bioprosthetic AVR well-functioning (mean gradient 9 mmHg), MAC, mild LAE, mildly reduced RVSF, mild RAE   Hypercholesteremia     Medications:  (Not in a hospital admission)  Scheduled:   atorvastatin  20 mg Oral Daily   Infusions:   heparin 1,450 Units/hr (10/15/22 1093)   PRN:   Assessment: 82 yom with a history of CAD, AVR, PE, HLD, colon mass. Patient is presenting with possible PE. Patient has history of PE (2016) with no anticoagulation. He was being worked up for a mass in his colon and had a CT abdomen pelvis today which showed a possible filling  defect in his pulmonary artery. CTA showed large burden of occlusive and nonocclusive pulmonary embolism in the lungs bilaterally with right heart strain (RV/LV ratio= 1.93) . Heparin per pharmacy consult placed for pulmonary embolus.  Patient was not on anticoagulation prior to arrival. Started on 1450 units/hr IV heparin following 6000 unit IV heparin bolus. Resulting heparin level is >1.1 which is supra-therapeutic.  No issues with infusion or bleeding or lab collection per RN.  Hgb 13.1; plt 177  Goal of Therapy:  Heparin level 0.3-0.7 units/ml Monitor platelets by anticoagulation protocol: Yes   Plan:  Hold heparin infusion x 1 hour Decrease heparin infusion to 1250 units/hr Check anti-Xa level at 0230 and daily while on heparin Continue to monitor H&H and platelets  Delmar Landau, PharmD, BCPS 10/15/2022 5:25 PM ED Clinical Pharmacist -  [phone]

## 2022-10-15 NOTE — ED Provider Notes (Signed)
[place] EMERGENCY DEPARTMENT AT [facility] Provider Note   CSN: [id number] Arrival date & time: 10/14/22  1951     History  Chief Complaint  Patient presents with   Possible PE    Frederick Burnett is a 82 y.o. male.  Patient's by surgeon for possible pulmonary embolism.  He is being worked up for a mass in his colon and had a CT abdomen pelvis today which showed a possible filling defect in his pulmonary artery.  Has had a pulmonary embolism in the remote past but is no longer on blood thinners. He states he feels fine and does not want to be here.  Denies any chest pain, shortness of breath, nausea, vomiting, sweating, dizziness, lightheadedness.  No leg pain or leg swelling.  No black or bloody stools. Being scheduled for a surgery for his colectomy after cardiac clearance.  Staging CT scan today showed possible pulmonary embolism.  The history is provided by the patient and a relative.       Home Medications Prior to Admission medications   Medication Sig Start Date End Date Taking? Authorizing Provider  amLODipine (NORVASC) 5 MG tablet Take 1 tablet (5 mg total) by mouth daily. Call and schedule follow up appt to receive further refills. [phone] 3rd attempt 08/16/21   Lyn Records, MD  aspirin EC 81 MG tablet Take 1 tablet (81 mg total) by mouth daily. 04/12/19   Rosalio Macadamia, NP  atorvastatin (LIPITOR) 20 MG tablet Take 1 tablet (20 mg total) by mouth daily. Please schedule appointment for future refills. 3rd and final attempt. Thank you 08/13/21   Lyn Records, MD  lisinopril-hydrochlorothiazide (ZESTORETIC) 20-25 MG tablet Take 0.5 tablets by mouth daily. Please make an appt. With Cardiologist in order to receive future refills. Thank You. 1st Attempt. 08/20/21   Lyn Records, MD  metoprolol tartrate (LOPRESSOR) 25 MG tablet Take 1 tablet (25 mg total) by mouth 2 (two) times daily. 04/15/14   Barrett, Erin R, PA-C  Multiple Vitamin (MULTIVITAMIN) capsule  Take 1 capsule by mouth daily.    [provider]  [facility] injection  04/14/20   [provider]  spironolactone (ALDACTONE) 25 MG tablet Take 1 tablet (25 mg total) by mouth daily. 04/17/22   Meriam Sprague, MD      Allergies    Patient has no known allergies.    Review of Systems   Review of Systems  Constitutional:  Negative for activity change, appetite change and fever.  HENT:  Negative for congestion.   Respiratory:  Negative for chest tightness and shortness of breath.   Cardiovascular:  Negative for chest pain.  Gastrointestinal:  Negative for abdominal pain, diarrhea, nausea and vomiting.  Genitourinary:  Negative for dysuria and hematuria.  Musculoskeletal:  Negative for arthralgias and myalgias.  Skin:  Negative for rash.  Neurological:  Negative for tremors, syncope and headaches.   all other systems are negative except as noted in the HPI and PMH.    Physical Exam Updated Vital Signs BP 104/73 (BP Location: Right Arm)   Pulse 81   Temp 97.9 F (36.6 C)   Resp 17   Ht 5\' 8"  (1.727 m)   Wt 95.7 kg   SpO2 95%   BMI 32.08 kg/m  Physical Exam Vitals and nursing note reviewed.  Constitutional:      General: He is not in acute distress.    Appearance: He is well-developed.  HENT:  Head: Normocephalic and atraumatic.     Mouth/Throat:     Pharynx: No oropharyngeal exudate.  Eyes:     Conjunctiva/sclera: Conjunctivae normal.     Pupils: Pupils are equal, round, and reactive to light.  Neck:     Comments: No meningismus. Cardiovascular:     Rate and Rhythm: Normal rate and regular rhythm.     Heart sounds: Normal heart sounds. No murmur heard. Pulmonary:     Effort: Pulmonary effort is normal. No respiratory distress.     Breath sounds: Normal breath sounds.  Abdominal:     Palpations: Abdomen is soft.     Tenderness: There is no abdominal tenderness. There is no guarding or rebound.  Musculoskeletal:        General: No  tenderness. Normal range of motion.     Cervical back: Normal range of motion and neck supple.  Skin:    General: Skin is warm.  Neurological:     Mental Status: He is alert and oriented to person, place, and time.     Cranial Nerves: No cranial nerve deficit.     Motor: No abnormal muscle tone.     Coordination: Coordination normal.     Comments:  5/5 strength throughout. CN 2-12 intact.Equal grip strength.   Psychiatric:        Behavior: Behavior normal.     ED Results / Procedures / Treatments   Labs (all labs ordered are listed, but only abnormal results are displayed) Labs Reviewed  COMPREHENSIVE METABOLIC PANEL - Abnormal; Notable for the following components:      Result Value   Potassium 5.9 (*)    CO2 20 (*)    Creatinine, Ser 1.49 (*)    AST 53 (*)    Total Bilirubin 1.6 (*)    GFR, Estimated 47 (*)    All other components within normal limits  I-STAT CREATININE, ED - Abnormal; Notable for the following components:   Creatinine, Ser 1.50 (*)    All other components within normal limits  CBC WITH DIFFERENTIAL/PLATELET  BRAIN NATRIURETIC PEPTIDE  POTASSIUM  HEPARIN LEVEL (UNFRACTIONATED)  TROPONIN I (HIGH SENSITIVITY)  TROPONIN I (HIGH SENSITIVITY)    EKG EKG Interpretation Date/Time:  Monday October 14 2022 21:16:55 EDT Ventricular Rate:  74 PR Interval:    QRS Duration:  96 QT Interval:  392 QTC Calculation: 435 R Axis:   -9  Text Interpretation: * Critical Test Result: AV Block Sinus rhythm with 2nd degree A-V block (Mobitz I) Minimal voltage criteria for LVH, may be normal variant ( R in aVL ) Abnormal ECG When compared with ECG of 26-Feb-2017 23:23, PREVIOUS ECG IS PRESENT mobitZ  block Confirmed by Glynn Octave [phone]) on 10/15/2022 4:35:06 AM  Radiology CT Angio Chest PE W and/or Wo Contrast  Result Date: 10/15/2022 CLINICAL DATA:  82 year old male with filling defect in a pulmonary artery branch on prior CT of the abdomen and pelvis. Follow-up  study to assess for pulmonary embolism. EXAM: CT ANGIOGRAPHY CHEST WITH CONTRAST TECHNIQUE: Multidetector CT imaging of the chest was performed using the standard protocol during bolus administration of intravenous contrast. Multiplanar CT image reconstructions and MIPs were obtained to evaluate the vascular anatomy. RADIATION DOSE REDUCTION: This exam was performed according to the departmental dose-optimization program which includes automated exposure control, adjustment of the mA and/or kV according to patient size and/or use of iterative reconstruction technique. CONTRAST:  75mL OMNIPAQUE IOHEXOL 350 MG/ML SOLN COMPARISON:  Chest CTA 04/24/2014. CT of the abdomen  and pelvis 10/14/2022. FINDINGS: Cardiovascular: There are multiple filling defects within pulmonary artery branches in the lungs bilaterally indicative of pulmonary embolism. There is extensive clot bilaterally, with the largest burden in the right lower lobe distribution involving the right lower lobe pulmonary artery and all segmental branches of the right lower lobe basal segments, many of which appear completely occlusive. Additional filling defects are noted on the left involving both left upper and lower lobe distributions, both occlusive and nonocclusive. There is also a potential filling defect in the right ventricle best appreciated on axial images 215 and 216 of series 7, concerning for thrombus. Estimated right ventricular diameter of 54 mm. Estimated left ventricular diameter of 28 mm. RV to LV ratio of 1.93. There is no significant pericardial fluid, thickening or pericardial calcification. There is aortic atherosclerosis, as well as atherosclerosis of the great vessels of the mediastinum and the coronary arteries, including calcified atherosclerotic plaque in the left main, left anterior descending, left circumflex and right coronary arteries. Status post median sternotomy for aortic valve replacement with what appears to be a stented  bioprosthesis. Aneurysmal dilatation of the ascending aorta above the anastomosis of the graft estimated to measure 4.9 cm in diameter. Mediastinum/Nodes: No pathologically enlarged mediastinal or hilar lymph nodes. Small hiatal hernia. No axillary lymphadenopathy. Lungs/Pleura: No acute consolidative airspace disease. No pleural effusions. Scattered small pulmonary nodules, largest of which is in the posterior aspect of the right lower lobe (axial image 95 of series 6) measuring 6 mm. Upper Abdomen: Aortic atherosclerosis. Musculoskeletal: Median sternotomy wires. There are no aggressive appearing lytic or blastic lesions noted in the visualized portions of the skeleton. Review of the MIP images confirms the above findings. IMPRESSION: 1. Study is positive for large burden of occlusive and nonocclusive pulmonary embolism in the lungs bilaterally, probable clot in the right ventricle and CT evidence of right heart strain (RV/LV Ratio = 1.93) consistent with at least submassive (intermediate risk) PE. The presence of right heart strain has been associated with an increased risk of morbidity and mortality. Please refer to the "Code PE Focused" order set in EPIC. 2. Small pulmonary nodules measuring 6 mm or less in size. Non-contrast chest CT at 6 months is recommended. If the nodules are stable at time of repeat CT, then future CT at 18-24 months (from today's scan) is considered optional for low-risk patients, but is recommended for high-risk patients. This recommendation follows the consensus statement: Guidelines for Management of Incidental Pulmonary Nodules Detected on CT Images: From the Fleischner Society 2017; Radiology 2017; 284:228-243. 3. Aortic atherosclerosis, in addition to left main and three-vessel coronary artery disease. Please note that although the presence of coronary artery calcium documents the presence of coronary artery disease, the severity of this disease and any potential stenosis cannot be  assessed on this non-gated CT examination. Assessment for potential risk factor modification, dietary therapy or pharmacologic therapy may be warranted, if clinically indicated. 4. Aneurysmal dilatation of the ascending thoracic aorta (4.9 cm in diameter). Recommend semi-annual imaging followup by CTA or MRA and referral to cardiothoracic surgery if not already obtained. This recommendation follows 2010 ACCF/AHA/AATS/ACR/ASA/SCA/SCAI/SIR/STS/SVM Guidelines for the Diagnosis and Management of Patients With Thoracic Aortic Disease. Circulation. 2010; 121: Z610-R604. Aortic aneurysm NOS (ICD10-I71.9) Critical Value/emergent results were called by telephone at the time of interpretation on 10/15/2022 at 5:37 am to provider [facility], who verbally acknowledged these results. Aortic Atherosclerosis (ICD10-I70.0). Electronically Signed   By: Trudie Reed M.D.   On: 10/15/2022 05:39  CT ABDOMEN PELVIS W CONTRAST  Result Date: 10/14/2022 CLINICAL DATA:  Descending colon mass.  * Tracking Code: BO * EXAM: CT ABDOMEN AND PELVIS WITH CONTRAST TECHNIQUE: Multidetector CT imaging of the abdomen and pelvis was performed using the standard protocol following bolus administration of intravenous contrast. RADIATION DOSE REDUCTION: This exam was performed according to the departmental dose-optimization program which includes automated exposure control, adjustment of the mA and/or kV according to patient size and/or use of iterative reconstruction technique. CONTRAST:  75mL OMNIPAQUE IOHEXOL 350 MG/ML SOLN COMPARISON:  CTA chest dated 04/16/2014, 01/05/2013 FINDINGS: Lower chest: Apparent central filling defect within right lower lobe posterior segmental pulmonary artery (3:3). No pleural effusion or pneumothorax demonstrated. Partially imaged heart size is normal. Coronary artery calcification. Hepatobiliary: No focal hepatic lesions. No intra or extrahepatic biliary ductal dilation. Cholelithiasis. Pancreas: No focal  lesions or main ductal dilation. Spleen: Normal in size without focal abnormality. Adrenals/Urinary Tract: 1.7 cm right adrenal nodule (3:26) measures 105 HU and is unchanged from 01/05/2013, likely adenoma. No specific follow-up imaging recommended. No left adrenal nodule. No suspicious renal mass, calculi or hydronephrosis. No focal bladder wall thickening. Stomach/Bowel: Normal appearance of the stomach. Mildly featureless appearance of the splenic flexure with suspected mural thickening (3:21) may correspond to known descending colon mass. Normal appendix. Vascular/Lymphatic: Aortic atherosclerosis. No enlarged abdominal or pelvic lymph nodes. Reproductive: Enlargement of the prostate with median lobe hypertrophy. Other: No free fluid, fluid collection, or free air. Musculoskeletal: No acute or abnormal lytic or blastic osseous lesions. Multilevel degenerative changes of the partially imaged thoracic and lumbar spine. Grade 1 anterolisthesis at L3-4 and grade 1 retrolisthesis at L5-S1. Small fat-containing paraumbilical and bilateral inguinal hernias code:Marland Kitchen IMPRESSION: 1. Apparent central filling defect within right lower lobe posterior segmental pulmonary artery, which may represent a pulmonary embolism. Recommend correlation with PE protocol CTA chest. 2. Mildly featureless appearance of the splenic flexure with suspected mural thickening may correspond to known descending colon mass. 3. No evidence of metastatic disease in the abdomen or pelvis. 4. Cholelithiasis. 5. Enlargement of the prostate with median lobe hypertrophy. 6.  Aortic Atherosclerosis (ICD10-I70.0). Critical Value/emergent results were called by telephone at the time of interpretation on 10/14/2022 at 7:04 pm to provider Abigail Miyamoto, who verbally acknowledged these results. Electronically Signed   By: Agustin Cree M.D.   On: 10/14/2022 19:12    Procedures .Critical Care  Performed by: Glynn Octave, MD Authorized by: Glynn Octave, MD   Critical care provider statement:    Critical care time (minutes):  45   Critical care time was exclusive of:  Separately billable procedures and treating other patients   Critical care was necessary to treat or prevent imminent or life-threatening deterioration of the following conditions: massive pulmonary emboli.   Critical care was time spent personally by me on the following activities:  Development of treatment plan with patient or surrogate, discussions with consultants, evaluation of patient's response to treatment, examination of patient, ordering and review of laboratory studies, ordering and review of radiographic studies, ordering and performing treatments and interventions, pulse oximetry, re-evaluation of patient's condition, review of old charts and obtaining history from patient or surrogate   I assumed direction of critical care for this patient from another provider in my specialty: no     Care discussed with: admitting provider       Medications Ordered in ED Medications  iohexol (OMNIPAQUE) 350 MG/ML injection 75 mL (75 mLs Intravenous Contrast Given 10/15/22 0458)  ED Course/ Medical Decision Making/ A&P                                 Medical Decision Making Amount and/or Complexity of Data Reviewed Labs: ordered. Decision-making details documented in ED Course. Radiology: ordered and independent interpretation performed. Decision-making details documented in ED Course. ECG/medicine tests: ordered and independent interpretation performed. Decision-making details documented in ED Course.  Risk Prescription drug management. Decision regarding hospitalization.   Concern for possible pulmonary embolism on outpatient CT scan.  No hypoxia or tachycardia.  No chest pain or shortness of breath.  Initial EKG showed apparent second-degree AV block, type II.  Repeat EKG shows sinus rhythm.  Discussed with cardiology Dr.  Elayne Guerin who reviewed EKG.  He  agrees this is concerning for type II AV block.  On repeat, there is normal sinus rhythm.  He recommends admission for monitoring and telemetry.  They will consult.   CT confirmed large pulmonary emboli bilaterally with clot burden and right-sided heart strain. Possible RV clot.  IV heparin initiated.  Discussed with Dr. Judeth Horn critical care.  Blood pressure and mental status remained stable.  No chest pain or shortness of breath.  No hypoxia increased work of breathing.  No hypoxia or tachycardia.   He agrees with IV heparin and medicine admission.  No indication for thrombolytics.   Remains stable in the ED.  Hospitalist consult callback pending at shift change.       Final Clinical Impression(s) / ED Diagnoses Final diagnoses:  Multiple pulmonary emboli [facility])    Rx / DC Orders ED Discharge Orders     None         Gohan Collister, Jeannett Senior, MD 10/15/22 [phone]

## 2022-10-15 NOTE — ED Notes (Signed)
Pt coming from CT

## 2022-10-15 NOTE — Progress Notes (Addendum)
NAME:  Frederick Burnett, MRN:  [id number], DOB:  1941-02-06, LOS: 0 ADMISSION DATE:  10/14/2022, CONSULTATION DATE:  10/15/22 REFERRING MD:  ED, CHIEF COMPLAINT:  abnormal ct scan as outpatient   History of Present Illness:  82 year old man with recent colon mass sent to ED after outpatient scan of the abdomen pelvis revealed pulmonary embolism.  CTA was obtained.  There is a pulmonary embolism with large clot burden.  Reported increased RV size.  BNP and troponin normal.  He has prior echocardiogram that showed chronic right-sided changes.  For what ever reason recent 04/2022 did not comment on these changes.  He is asymptomatic.  On room air.  Not tachycardic.  Satting 100%.  Pertinent  Medical History  Colon mass  Significant Hospital Events: Including procedures, antibiotic start and stop dates in addition to other pertinent events   8/19 sent to ED for CTA with positive PE on CT abdomen pelvis with contrast for possible colon mass workup  Interim History / Subjective:    Objective   Blood pressure 109/84, pulse 80, temperature 98.1 F (36.7 C), temperature source Oral, resp. rate 16, height 5\' 8"  (1.727 m), weight 95.7 kg, SpO2 100%.       No intake or output data in the 24 hours ending 10/15/22 1225 Filed Weights   10/14/22 2107  Weight: 95.7 kg    Examination: Awake alert walking to the bathroom without difficulty Hemodynamically stable Lungs clear to auscultation Heart sounds are regular Abdomen is obese soft nontender positive bowel sounds Extremities are soft without edema nontender Neurologically intact   Assessment & Plan:  Presumed submassive PE: Based on CT images with large clot burden.  Initial troponin normal.  BNP pending.  Curiously recent TTE 04/2022 relatively normal.  On previous echocardiogram she shown signs of RV changes right atrial dilation, chronic right-sided issues.  He is on room air.  He is not tachycardic although he does prescribed a  beta-blocker.  He is normotensive.  2D echo has been reviewed no need for further intervention With heparin intervention with transition to p.o. anticoagulation [place] critical care will sign off at this time     Best Practice (right click and "Reselect all SmartList Selections" daily)   Per primary  Labs   CBC: Recent Labs  Lab 10/14/22 2215  WBC 6.2  NEUTROABS 3.6  HGB 13.1  HCT 41.1  MCV 87.8  PLT 177    Basic Metabolic Panel: Recent Labs  Lab 10/14/22 2215 10/14/22 2325 10/15/22 0510  NA 136  --   --   K 5.9*  --  3.6  CL 105  --   --   CO2 20*  --   --   GLUCOSE 92  --   --   BUN 17  --   --   CREATININE 1.49* 1.50*  --   CALCIUM 9.0  --   --    GFR: Estimated Creatinine Clearance: 42.6 mL/min (A) (by C-G formula based on SCr of 1.5 mg/dL (H)). Recent Labs  Lab 10/14/22 2215  WBC 6.2    Liver Function Tests: Recent Labs  Lab 10/14/22 2215  AST 53*  ALT 24  ALKPHOS 100  BILITOT 1.6*  PROT 7.9  ALBUMIN 4.2   No results for input(s): "LIPASE", "AMYLASE" in the last 168 hours. No results for input(s): "AMMONIA" in the last 168 hours.  ABG    Component Value Date/Time   PHART 7.323 (L) 04/11/2014 1648   PCO2ART 48.4 (H)  04/11/2014 1648   PO2ART 96.0 04/11/2014 1648   HCO3 25.2 (H) 04/11/2014 1648   TCO2 20 04/24/2014 1448   ACIDBASEDEF 1.0 04/11/2014 1648   O2SAT 97.0 04/11/2014 1648     Coagulation Profile: No results for input(s): "INR", "PROTIME" in the last 168 hours.  Cardiac Enzymes: No results for input(s): "CKTOTAL", "CKMB", "CKMBINDEX", "TROPONINI" in the last 168 hours.  HbA1C: Hgb A1c MFr Bld  Date/Time Value Ref Range Status  04/25/2014 03:05 AM 5.7 (H) 4.8 - 5.6 % Final    Comment:    (NOTE)         Pre-diabetes: 5.7 - 6.4         Diabetes: >6.4         Glycemic control for adults with diabetes: <7.0   04/07/2014 10:57 AM 5.7 (H) 4.8 - 5.6 % Final    Comment:    (NOTE)         Pre-diabetes: 5.7 - 6.4          Diabetes: >6.4         Glycemic control for adults with diabetes: <7.0     CBG: No results for input(s): "GLUCAP" in the last 168 hours.    Brett Canales Minor ACNP Acute Care Nurse Practitioner [facility] Pulmonary/Critical Care Please consult Amion 10/15/2022, 12:25 PM  I agree with the Advanced Practitioner's note, impression, and recommendations as outlined. I have taken an independent interval history, reviewed the chart and examined the patient.  My medical decision making is as follows:   Subjective: Briefly 82 yo with CT A/P done as an outpatient which showed PE.   Objective: Blood pressure 110/83, pulse 78, temperature 97.9 F (36.6 C), temperature source Oral, resp. rate 20, height 5\' 8"  (1.727 m), weight 95.7 kg, SpO2 100%.  Elderly man, sitting up in bed, no distress RRR Breathing is nonlabored on room air No peripheral edema  Labs/Imaging: Tropn negative Bnp negative Echo - no RV strain  Assessment and Plan:  Acute pulmonary embolism, possibly provoked in the context of colonic mass - undiagnosed malignancy.  Proceed with heparin and transition to oral agent - no indication for additional PE therapies or interventions at this time.  In general, this is a PE with low risk features and low PESI Score. In weighing risk/benefit, Would be ok with interruption of anticoagulation in the next 30 days for urgent colectomy if there is high concern for morbidity in case surgery is delayed.  Would not want his surgery to be delayed if there are high risk features since this PE diagnosis is an incidental finding.   However would recommend anticoagulation for 6 months plus any duration that he is deemed to have ongoing malignancy.   Please call with questions.    Mickel Baas Pulmonary and Critical Care Medicine 10/15/2022 5:48 PM  Pager: see AMION  If no response to pager, please call critical care on call (see AMION) until 7pm After 7:00 pm call Elink

## 2022-10-15 NOTE — Progress Notes (Signed)
ANTICOAGULATION CONSULT NOTE - Initial Consult  Pharmacy Consult for heparin Indication: pulmonary embolus  No Known Allergies  Patient Measurements: Height: 5\' 8"  (172.7 cm) Weight: 95.7 kg (210 lb 15.7 oz) IBW/kg (Calculated) : 68.4 Heparin Dosing Weight: 88.6 kg  Vital Signs: Temp: 97.9 F (36.6 C) (08/20 0320) Temp Source: Oral (08/19 2313) BP: 124/83 (08/20 0515) Pulse Rate: 78 (08/20 0515)  Labs: Recent Labs    10/14/22 2215  HGB 13.1  HCT 41.1  PLT 177  CREATININE 1.49*    Estimated Creatinine Clearance: 42.9 mL/min (A) (by C-G formula based on SCr of 1.49 mg/dL (H)).   Medical History: Past Medical History:  Diagnosis Date   Aortic regurgitation    Aortic regurgitation    Aortic root enlargement (HCC)    Arthritis    both shoulders - RC- wear   Heart murmur    History of pulmonary embolism 04/24/2014   Provoked; occurred after AVR in 2016   HTN (hypertension)    Hx of echocardiogram    Echo 3/16: mild LVH, EF 40-45%, ant-septal AK, AVR with elevated velocity (3.3 m/s - slightly above expected value), mild LAE //  Echo 3/17: Mild LVH, EF 40%, diffuse HK, indeterminate diastolic function, bioprosthetic AVR well-functioning (mean gradient 9 mmHg), MAC, mild LAE, mildly reduced RVSF, mild RAE   Hypercholesteremia    Assessment:  33 yoM presented to [facility] for possible PE. Patient has history of PE (2016) with no anticoagulation. He was being worked up for a mass in his colon and had a CT abdomen pelvis today which showed a possible filling defect in his pulmonary artery. CTA showed large burden of occlusive and nonocclusive pulmonary embolism in the lungs bilaterally with right heart strain (RV/LV ratio= 1.93). CBC stable and no PTA anticoagulation  Goal of Therapy:  Heparin level 0.3-0.7 units/ml Monitor platelets by anticoagulation protocol: Yes   Plan:  Give 6000 units bolus x 1 Start heparin infusion at 1450 units/hr Check anti-Xa level in 8 hours and  daily while on heparin Continue to monitor H&H and platelets  Arabella Merles, PharmD. Clinical Pharmacist 10/15/2022 5:49 AM

## 2022-10-15 NOTE — Progress Notes (Signed)
Echocardiogram 2D Echocardiogram has been performed.  Warren Lacy Tamiki Kuba RDCS 10/15/2022, 8:57 AM

## 2022-10-15 NOTE — ED Provider Notes (Signed)
Pt signed out by Dr. Manus Gunning pending call from hospitalist.  Pt d/w Dr. Tyson Babinski ([facility]) who will admit.   Jacalyn Lefevre, MD 10/15/22 0800

## 2022-10-15 NOTE — ED Notes (Signed)
Pt lost access in CT/ Iv team consulted after 2 staff members  unsuccessfully attempted IV access

## 2022-10-15 NOTE — H&P (Signed)
[facility] History and Physical  Frederick Burnett UJW:[id number] DOB: 14-Oct-1940 DOA: 10/14/2022  Referring physician: ED  PCP: [facility]   Patient is coming from: Home.  Chief Complaint: Pulmonary embolism.  HPI:   Patient is a 82 years old male with past medical history of coronary artery disease status post stent, aortic valve replacement, history of pulmonary embolism., hyperlipidemia, colon mass under investigation was sent to ED after outpatient CT scan showing possible filling defect in the pulmonary artery.  Patient did have a history of pulmonary embolism in the past but was not on blood thinners.  Patient denies any chest pain, shortness of breath, nausea, vomiting, fever, shortness of breath.  Denies any leg pain or swelling.  Denies any nausea vomiting or abdominal pain.  Denied any hematemesis or melena.  In the ED CTA of the chest was done which showed large pulmonary embolism with clot burden with increased RV size.  BNP and troponin was normal.  Patient did have a previous 2D echocardiogram with chronic right-sided changes.  He was asymptomatic on room air and not tachycardic with normal pulse ox.  Initially PCCM was consulted but since he was hemodynamically stable medical team was consulted for admission to the hospital.  Assessment and Plan  Presumed submassive PE.  CTA of the chest showed a large pulmonary embolism bilaterally with clot burden and right sided heart strain.  Possible RV clot.  Initial troponin normal.  BNP pending.  Transthoracic echo from 2024 March was normal with some RV changes.  Patient currently on room air without any tachycardia and nonhypoxic.  Blood pressure seems to be stable.  PCCM has recommended observation in the hospital with IV heparin drip.  Check 2D echocardiogram.  Initial EKG showed some second-degree AV block type II.  Repeat EKG showed sinus rhythm.  Will likely need long-term anticoagulation.  Abnormal EKG.   Cardiology to follow.  Colon mass.  Under investigation as outpatient.  Patient is scheduled to undergo colectomy after cardiac clearance.  Staging CT today showed possible pulmonary embolism.  DVT Prophylaxis: Heparin drip  Review of Systems:  All systems were reviewed and were negative unless otherwise mentioned in the HPI   Past Medical History:  Diagnosis Date   Aortic regurgitation    Aortic regurgitation    Aortic root enlargement (HCC)    Arthritis    both shoulders - RC- wear   Heart murmur    History of pulmonary embolism 04/24/2014   Provoked; occurred after AVR in 2016   HTN (hypertension)    Hx of echocardiogram    Echo 3/16: mild LVH, EF 40-45%, ant-septal AK, AVR with elevated velocity (3.3 m/s - slightly above expected value), mild LAE //  Echo 3/17: Mild LVH, EF 40%, diffuse HK, indeterminate diastolic function, bioprosthetic AVR well-functioning (mean gradient 9 mmHg), MAC, mild LAE, mildly reduced RVSF, mild RAE   Hypercholesteremia    Past Surgical History:  Procedure Laterality Date   AORTIC VALVE REPLACEMENT N/A 04/11/2014   Procedure: AORTIC VALVE REPLACEMENT (AVR);  Surgeon: Alleen Borne, MD;  Location: [facility] OR;  Service: Open Heart Surgery;  Laterality: N/A;   CARDIAC CATHETERIZATION     LEFT AND RIGHT HEART CATHETERIZATION WITH CORONARY ANGIOGRAM N/A 02/22/2014   Procedure: LEFT AND RIGHT HEART CATHETERIZATION WITH CORONARY ANGIOGRAM;  Surgeon: Lesleigh Noe, MD;  Location: [facility] CATH LAB;  Service: Cardiovascular;  Laterality: N/A;   MULTIPLE TOOTH EXTRACTIONS     TEE WITHOUT CARDIOVERSION  N/A 03/24/2014   Procedure: TRANSESOPHAGEAL ECHOCARDIOGRAM (TEE);  Surgeon: Lars Masson, MD;  Location: [facility] ENDOSCOPY;  Service: Cardiovascular;  Laterality: N/A;   TEE WITHOUT CARDIOVERSION N/A 04/11/2014   Procedure: TRANSESOPHAGEAL ECHOCARDIOGRAM (TEE);  Surgeon: Alleen Borne, MD;  Location: [facility] OR;  Service: Open Heart Surgery;  Laterality: N/A;    Social  History:  reports that he quit smoking about 21 years ago. His smoking use included cigars. He has never used smokeless tobacco. He reports that he does not drink alcohol and does not use drugs.  No Known Allergies  Family History  Problem Relation Age of Onset   Hypertension Mother    Heart attack Father    Hypertension Daughter    Hypertension Sister    Hypertension Brother    Stroke Neg Hx      Prior to Admission medications   Medication Sig Start Date End Date Taking? Authorizing Provider  amLODipine (NORVASC) 5 MG tablet Take 1 tablet (5 mg total) by mouth daily. Call and schedule follow up appt to receive further refills. [phone] 3rd attempt 08/16/21  Yes Lyn Records, MD  aspirin EC 81 MG tablet Take 1 tablet (81 mg total) by mouth daily. Patient taking differently: Take 81 mg by mouth at bedtime. 04/12/19  Yes Rosalio Macadamia, NP  atorvastatin (LIPITOR) 20 MG tablet Take 1 tablet (20 mg total) by mouth daily. Please schedule appointment for future refills. 3rd and final attempt. Thank you Patient taking differently: Take 20 mg by mouth at bedtime. 08/13/21  Yes Lyn Records, MD  lisinopril-hydrochlorothiazide (ZESTORETIC) 10-12.5 MG tablet Take 1 tablet by mouth daily.   Yes [provider]  metoprolol tartrate (LOPRESSOR) 25 MG tablet Take 1 tablet (25 mg total) by mouth 2 (two) times daily. 04/15/14  Yes Barrett, Erin R, PA-C  Multiple Vitamins-Minerals (CENTRUM SILVER 50+MEN) TABS Take 1 tablet by mouth daily.   Yes [provider]  spironolactone (ALDACTONE) 25 MG tablet Take 1 tablet (25 mg total) by mouth daily. 04/17/22  Yes Meriam Sprague, MD  metroNIDAZOLE (FLAGYL) 500 MG tablet PLEASE SEE ATTACHED FOR DETAILED DIRECTIONS Patient not taking: Reported on 10/15/2022 10/07/22   [provider]  neomycin (MYCIFRADIN) 500 MG tablet Take 1,000 mg by mouth 3 (three) times daily. Patient not taking: Reported on 10/15/2022 10/07/22   [provider]    Physical Exam: Vitals:   10/15/22 1100 10/15/22 1115 10/15/22 1145 10/15/22 1315  BP: 109/83 132/88 109/84 125/73  Pulse: 82 83 80 91  Resp: (!) 33 18 16 (!) 21  Temp:   98.1 F (36.7 C)   TempSrc:   Oral   SpO2: 93% 100% 100% 99%  Weight:      Height:       Wt Readings from Last 3 Encounters:  10/14/22 95.7 kg  04/17/22 95.9 kg  06/14/20 91.7 kg   Body mass index is 32.08 kg/m.  General:  Average built, not in obvious distress HENT: Normocephalic, No scleral pallor or icterus noted. Oral mucosa is moist.  Chest:  Clear breath sounds.  . No crackles or wheezes.  Sternal scar noted. CVS: S1 &S2 heard. No murmur.  Regular rate and rhythm. Abdomen: Soft, nontender, nondistended.  Bowel sounds are heard. No abdominal mass palpated Extremities: No cyanosis, clubbing or edema.  Peripheral pulses are palpable. Psych: Alert, awake and oriented, normal mood CNS:  No cranial nerve deficits.  Power equal in all extremities.   Skin: Warm and  dry.  No rashes noted.  Labs on Admission:   CBC: Recent Labs  Lab 10/14/22 2215  WBC 6.2  NEUTROABS 3.6  HGB 13.1  HCT 41.1  MCV 87.8  PLT 177    Basic Metabolic Panel: Recent Labs  Lab 10/14/22 2215 10/14/22 2325 10/15/22 0510  NA 136  --   --   K 5.9*  --  3.6  CL 105  --   --   CO2 20*  --   --   GLUCOSE 92  --   --   BUN 17  --   --   CREATININE 1.49* 1.50*  --   CALCIUM 9.0  --   --     Liver Function Tests: Recent Labs  Lab 10/14/22 2215  AST 53*  ALT 24  ALKPHOS 100  BILITOT 1.6*  PROT 7.9  ALBUMIN 4.2   No results for input(s): "LIPASE", "AMYLASE" in the last 168 hours. No results for input(s): "AMMONIA" in the last 168 hours.  Cardiac Enzymes: No results for input(s): "CKTOTAL", "CKMB", "CKMBINDEX", "TROPONINI" in the last 168 hours.  BNP (last 3 results) Recent Labs    10/15/22 0510  BNP 42.5    ProBNP (last 3 results) No results for input(s): "PROBNP" in the last 8760  hours.  CBG: No results for input(s): "GLUCAP" in the last 168 hours.  Lipase  No results found for: "LIPASE"   Urinalysis    Component Value Date/Time   COLORURINE YELLOW 02/26/2017 1830   APPEARANCEUR CLEAR 02/26/2017 1830   LABSPEC 1.021 02/26/2017 1830   PHURINE 6.0 02/26/2017 1830   GLUCOSEU NEGATIVE 02/26/2017 1830   HGBUR NEGATIVE 02/26/2017 1830   BILIRUBINUR NEGATIVE 02/26/2017 1830   KETONESUR NEGATIVE 02/26/2017 1830   PROTEINUR NEGATIVE 02/26/2017 1830   UROBILINOGEN 1.0 04/07/2014 1057   NITRITE NEGATIVE 02/26/2017 1830   LEUKOCYTESUR NEGATIVE 02/26/2017 1830     Drugs of Abuse  No results found for: "LABOPIA", "COCAINSCRNUR", "LABBENZ", "AMPHETMU", "THCU", "LABBARB"    Radiological Exams on Admission: ECHOCARDIOGRAM COMPLETE  Result Date: 10/15/2022    ECHOCARDIOGRAM REPORT   Patient Name:   TAFARI CHUNG Date of Exam: 10/15/2022 Medical Rec #:  [id number]      Height:       68.0 in Accession #:    [id number]     Weight:       211.0 lb Date of Birth:  [birth year]      BSA:          2.091 m Patient Age:    82 years       BP:           123/84 mmHg Patient Gender: M              HR:           80 bpm. Exam Location:  Inpatient Procedure: 2D Echo, Color Doppler and Cardiac Doppler Indications:    I26.02 Pulmonary embolus  History:        Patient has prior history of Echocardiogram examinations, most                 recent 05/15/2022. Risk Factors:Hypertension.                 Aortic Valve: 23 mm Magna valve is present in the aortic                 position. Procedure Date: 04/11/14.  Sonographer:    Irving Burton Senior RDCS Referring Phys:  [id number] MATTHEW R HUNSUCKER IMPRESSIONS  1. Left ventricular ejection fraction, by estimation, is 60 to 65%. The left ventricle has normal function. The left ventricle has no regional wall motion abnormalities. There is severe left ventricular hypertrophy of the basal-septal segment. Left ventricular diastolic parameters are consistent with Grade I  diastolic dysfunction (impaired relaxation).  2. Right ventricular systolic function is normal. The right ventricular size is mildly enlarged. There is normal pulmonary artery systolic pressure. The estimated right ventricular systolic pressure is 28.2 mmHg.  3. The mitral valve is normal in structure. Trivial mitral valve regurgitation. No evidence of mitral stenosis.  4. The aortic valve has been repaired/replaced Aortic valve regurgitation is not visualized. There is a 23 mm Magna valve present in the aortic position. Procedure Date: 04/11/14. Aortic valve area, by VTI measures 1.10 cm. Aortic valve mean gradient measures 11.0 mmHg. Aortic valve Vmax measures 2.08 m/s.  5. Aortic dilatation noted. There is moderate dilatation of the aortic root, measuring 48 mm.  6. The inferior vena cava is normal in size with greater than 50% respiratory variability, suggesting right atrial pressure of 3 mmHg.  7. Ascending aorta measurements are within normal limits for age when indexed to body surface area. FINDINGS  Left Ventricle: Left ventricular ejection fraction, by estimation, is 60 to 65%. The left ventricle has normal function. The left ventricle has no regional wall motion abnormalities. The left ventricular internal cavity size was normal in size. There is  severe left ventricular hypertrophy of the basal-septal segment. Left ventricular diastolic parameters are consistent with Grade I diastolic dysfunction (impaired relaxation). Normal left ventricular filling pressure. Right Ventricle: The right ventricular size is mildly enlarged. No increase in right ventricular wall thickness. Right ventricular systolic function is normal. There is normal pulmonary artery systolic pressure. The tricuspid regurgitant velocity is 2.51  m/s, and with an assumed right atrial pressure of 3 mmHg, the estimated right ventricular systolic pressure is 28.2 mmHg. Left Atrium: Left atrial size was normal in size. Right Atrium: Right  atrial size was normal in size. Pericardium: There is no evidence of pericardial effusion. Mitral Valve: The mitral valve is normal in structure. Trivial mitral valve regurgitation. No evidence of mitral valve stenosis. Tricuspid Valve: The tricuspid valve is normal in structure. Tricuspid valve regurgitation is mild . No evidence of tricuspid stenosis. Aortic Valve: The aortic valve has been repaired/replaced. Aortic valve regurgitation is not visualized. Aortic valve mean gradient measures 11.0 mmHg. Aortic valve peak gradient measures 17.3 mmHg. Aortic valve area, by VTI measures 1.10 cm. There is a  23 mm Magna valve present in the aortic position. Procedure Date: 04/11/14. Pulmonic Valve: The pulmonic valve was normal in structure. Pulmonic valve regurgitation is not visualized. No evidence of pulmonic stenosis. Aorta: Aortic dilatation noted. Ascending aorta measurements are within normal limits for age when indexed to body surface area. There is moderate dilatation of the aortic root, measuring 48 mm. Venous: The inferior vena cava is normal in size with greater than 50% respiratory variability, suggesting right atrial pressure of 3 mmHg. IAS/Shunts: No atrial level shunt detected by color flow Doppler.  LEFT VENTRICLE PLAX 2D LVIDd:         3.70 cm   Diastology LVIDs:         2.70 cm   LV e' medial:    6.74 cm/s LV PW:         1.30 cm   LV E/e' medial:  7.2 LV IVS:  2.12 cm   LV e' lateral:   8.49 cm/s LVOT diam:     2.10 cm   LV E/e' lateral: 5.7 LV SV:         45 LV SV Index:   21 LVOT Area:     3.46 cm  RIGHT VENTRICLE RV S prime:     11.10 cm/s TAPSE (M-mode): 2.1 cm LEFT ATRIUM             Index        RIGHT ATRIUM           Index LA diam:        3.00 cm 1.43 cm/m   RA Area:     17.40 cm LA Vol (A2C):   53.7 ml 25.69 ml/m  RA Volume:   45.90 ml  21.95 ml/m LA Vol (A4C):   53.0 ml 25.35 ml/m LA Biplane Vol: 55.0 ml 26.31 ml/m  AORTIC VALVE AV Area (Vmax):    1.26 cm AV Area (Vmean):   1.17  cm AV Area (VTI):     1.10 cm AV Vmax:           208.00 cm/s AV Vmean:          163.000 cm/s AV VTI:            0.406 m AV Peak Grad:      17.3 mmHg AV Mean Grad:      11.0 mmHg LVOT Vmax:         75.50 cm/s LVOT Vmean:        54.850 cm/s LVOT VTI:          0.130 m LVOT/AV VTI ratio: 0.32  AORTA Ao Root diam: 3.20 cm Ao Asc diam:  4.30 cm MITRAL VALVE               TRICUSPID VALVE MV Area (PHT): 3.58 cm    TR Peak grad:   25.2 mmHg MV Decel Time: 212 msec    TR Vmax:        251.00 cm/s MV E velocity: 48.50 cm/s MV A velocity: 77.50 cm/s  SHUNTS MV E/A ratio:  0.63        Systemic VTI:  0.13 m                            Systemic Diam: 2.10 cm Armanda Magic MD Electronically signed by Armanda Magic MD Signature Date/Time: 10/15/2022/10:06:01 AM    Final    CT Angio Chest PE W and/or Wo Contrast  Result Date: 10/15/2022 CLINICAL DATA:  82 year old male with filling defect in a pulmonary artery branch on prior CT of the abdomen and pelvis. Follow-up study to assess for pulmonary embolism. EXAM: CT ANGIOGRAPHY CHEST WITH CONTRAST TECHNIQUE: Multidetector CT imaging of the chest was performed using the standard protocol during bolus administration of intravenous contrast. Multiplanar CT image reconstructions and MIPs were obtained to evaluate the vascular anatomy. RADIATION DOSE REDUCTION: This exam was performed according to the departmental dose-optimization program which includes automated exposure control, adjustment of the mA and/or kV according to patient size and/or use of iterative reconstruction technique. CONTRAST:  75mL OMNIPAQUE IOHEXOL 350 MG/ML SOLN COMPARISON:  Chest CTA 04/24/2014. CT of the abdomen and pelvis 10/14/2022. FINDINGS: Cardiovascular: There are multiple filling defects within pulmonary artery branches in the lungs bilaterally indicative of pulmonary embolism. There is extensive clot bilaterally, with the largest burden in the right lower lobe distribution  involving the right lower lobe  pulmonary artery and all segmental branches of the right lower lobe basal segments, many of which appear completely occlusive. Additional filling defects are noted on the left involving both left upper and lower lobe distributions, both occlusive and nonocclusive. There is also a potential filling defect in the right ventricle best appreciated on axial images 215 and 216 of series 7, concerning for thrombus. Estimated right ventricular diameter of 54 mm. Estimated left ventricular diameter of 28 mm. RV to LV ratio of 1.93. There is no significant pericardial fluid, thickening or pericardial calcification. There is aortic atherosclerosis, as well as atherosclerosis of the great vessels of the mediastinum and the coronary arteries, including calcified atherosclerotic plaque in the left main, left anterior descending, left circumflex and right coronary arteries. Status post median sternotomy for aortic valve replacement with what appears to be a stented bioprosthesis. Aneurysmal dilatation of the ascending aorta above the anastomosis of the graft estimated to measure 4.9 cm in diameter. Mediastinum/Nodes: No pathologically enlarged mediastinal or hilar lymph nodes. Small hiatal hernia. No axillary lymphadenopathy. Lungs/Pleura: No acute consolidative airspace disease. No pleural effusions. Scattered small pulmonary nodules, largest of which is in the posterior aspect of the right lower lobe (axial image 95 of series 6) measuring 6 mm. Upper Abdomen: Aortic atherosclerosis. Musculoskeletal: Median sternotomy wires. There are no aggressive appearing lytic or blastic lesions noted in the visualized portions of the skeleton. Review of the MIP images confirms the above findings. IMPRESSION: 1. Study is positive for large burden of occlusive and nonocclusive pulmonary embolism in the lungs bilaterally, probable clot in the right ventricle and CT evidence of right heart strain (RV/LV Ratio = 1.93) consistent with at least  submassive (intermediate risk) PE. The presence of right heart strain has been associated with an increased risk of morbidity and mortality. Please refer to the "Code PE Focused" order set in EPIC. 2. Small pulmonary nodules measuring 6 mm or less in size. Non-contrast chest CT at 6 months is recommended. If the nodules are stable at time of repeat CT, then future CT at 18-24 months (from today's scan) is considered optional for low-risk patients, but is recommended for high-risk patients. This recommendation follows the consensus statement: Guidelines for Management of Incidental Pulmonary Nodules Detected on CT Images: From the Fleischner Society 2017; Radiology 2017; 284:228-243. 3. Aortic atherosclerosis, in addition to left main and three-vessel coronary artery disease. Please note that although the presence of coronary artery calcium documents the presence of coronary artery disease, the severity of this disease and any potential stenosis cannot be assessed on this non-gated CT examination. Assessment for potential risk factor modification, dietary therapy or pharmacologic therapy may be warranted, if clinically indicated. 4. Aneurysmal dilatation of the ascending thoracic aorta (4.9 cm in diameter). Recommend semi-annual imaging followup by CTA or MRA and referral to cardiothoracic surgery if not already obtained. This recommendation follows 2010 ACCF/AHA/AATS/ACR/ASA/SCA/SCAI/SIR/STS/SVM Guidelines for the Diagnosis and Management of Patients With Thoracic Aortic Disease. Circulation. 2010; 121: Z610-R604. Aortic aneurysm NOS (ICD10-I71.9) Critical Value/emergent results were called by telephone at the time of interpretation on 10/15/2022 at 5:37 am to provider [facility], who verbally acknowledged these results. Aortic Atherosclerosis (ICD10-I70.0). Electronically Signed   By: Trudie Reed M.D.   On: 10/15/2022 05:39   CT ABDOMEN PELVIS W CONTRAST  Result Date: 10/14/2022 CLINICAL DATA:   Descending colon mass.  * Tracking Code: BO * EXAM: CT ABDOMEN AND PELVIS WITH CONTRAST TECHNIQUE: Multidetector CT imaging of the  abdomen and pelvis was performed using the standard protocol following bolus administration of intravenous contrast. RADIATION DOSE REDUCTION: This exam was performed according to the departmental dose-optimization program which includes automated exposure control, adjustment of the mA and/or kV according to patient size and/or use of iterative reconstruction technique. CONTRAST:  75mL OMNIPAQUE IOHEXOL 350 MG/ML SOLN COMPARISON:  CTA chest dated 04/16/2014, 01/05/2013 FINDINGS: Lower chest: Apparent central filling defect within right lower lobe posterior segmental pulmonary artery (3:3). No pleural effusion or pneumothorax demonstrated. Partially imaged heart size is normal. Coronary artery calcification. Hepatobiliary: No focal hepatic lesions. No intra or extrahepatic biliary ductal dilation. Cholelithiasis. Pancreas: No focal lesions or main ductal dilation. Spleen: Normal in size without focal abnormality. Adrenals/Urinary Tract: 1.7 cm right adrenal nodule (3:26) measures 105 HU and is unchanged from 01/05/2013, likely adenoma. No specific follow-up imaging recommended. No left adrenal nodule. No suspicious renal mass, calculi or hydronephrosis. No focal bladder wall thickening. Stomach/Bowel: Normal appearance of the stomach. Mildly featureless appearance of the splenic flexure with suspected mural thickening (3:21) may correspond to known descending colon mass. Normal appendix. Vascular/Lymphatic: Aortic atherosclerosis. No enlarged abdominal or pelvic lymph nodes. Reproductive: Enlargement of the prostate with median lobe hypertrophy. Other: No free fluid, fluid collection, or free air. Musculoskeletal: No acute or abnormal lytic or blastic osseous lesions. Multilevel degenerative changes of the partially imaged thoracic and lumbar spine. Grade 1 anterolisthesis at L3-4 and  grade 1 retrolisthesis at L5-S1. Small fat-containing paraumbilical and bilateral inguinal hernias code:Marland Kitchen IMPRESSION: 1. Apparent central filling defect within right lower lobe posterior segmental pulmonary artery, which may represent a pulmonary embolism. Recommend correlation with PE protocol CTA chest. 2. Mildly featureless appearance of the splenic flexure with suspected mural thickening may correspond to known descending colon mass. 3. No evidence of metastatic disease in the abdomen or pelvis. 4. Cholelithiasis. 5. Enlargement of the prostate with median lobe hypertrophy. 6.  Aortic Atherosclerosis (ICD10-I70.0). Critical Value/emergent results were called by telephone at the time of interpretation on 10/14/2022 at 7:04 pm to provider Abigail Miyamoto, who verbally acknowledged these results. Electronically Signed   By: Agustin Cree M.D.   On: 10/14/2022 19:12    EKG: Personally reviewed by me which shows normal sinus rhythm, initial EKG showed type II block.   Consultant:  PCCM Cardiology  Code Status: Full code  Microbiology none  Antibiotics: None  Family Communication:  Patients' condition and plan of care including tests being ordered have been discussed with the patient and the patient's spouse at bedside who indicate understanding and agree with the plan.   Status is: Inpatient Remains inpatient appropriate because: Presumed submassive pulmonary embolism, heparin drip   Severity of Illness: The appropriate patient status for this patient is INPATIENT. Inpatient status is judged to be reasonable and necessary in order to provide the required intensity of service to ensure the patient's safety. The patient's presenting symptoms, physical exam findings, and initial radiographic and laboratory data in the context of their chronic comorbidities is felt to place them at high risk for further clinical deterioration. Furthermore, it is not anticipated that the patient will be medically stable  for discharge from the hospital within 2 midnights of admission.   I certify that at the point of admission it is my clinical judgment that the patient will require inpatient hospital care spanning beyond 2 midnights from the point of admission due to high intensity of service, high risk for further deterioration and high frequency of surveillance required.  Signed, Rebekah Chesterfield  Deniz Hannan, MD [facility] 10/15/2022

## 2022-10-16 ENCOUNTER — Other Ambulatory Visit (HOSPITAL_COMMUNITY): Payer: Self-pay

## 2022-10-16 DIAGNOSIS — I2699 Other pulmonary embolism without acute cor pulmonale: Secondary | ICD-10-CM | POA: Diagnosis not present

## 2022-10-16 LAB — CBC
HCT: 32.4 % — ABNORMAL LOW (ref 39.0–52.0)
HCT: 35.8 % — ABNORMAL LOW (ref 39.0–52.0)
Hemoglobin: 11 g/dL — ABNORMAL LOW (ref 13.0–17.0)
Hemoglobin: 11.4 g/dL — ABNORMAL LOW (ref 13.0–17.0)
MCH: 28.1 pg (ref 26.0–34.0)
MCH: 27.7 pg (ref 26.0–34.0)
MCHC: 34 g/dL (ref 30.0–36.0)
MCHC: 31.8 g/dL (ref 30.0–36.0)
MCV: 82.9 fL (ref 80.0–100.0)
MCV: 86.9 fL (ref 80.0–100.0)
Platelets: 89 10*3/uL — ABNORMAL LOW (ref 150–400)
Platelets: 147 10*3/uL — ABNORMAL LOW (ref 150–400)
RBC: 3.91 MIL/uL — ABNORMAL LOW (ref 4.22–5.81)
RBC: 4.12 MIL/uL — ABNORMAL LOW (ref 4.22–5.81)
RDW: 13.4 % (ref 11.5–15.5)
RDW: 13.4 % (ref 11.5–15.5)
WBC: 6.3 10*3/uL (ref 4.0–10.5)
WBC: 6.7 10*3/uL (ref 4.0–10.5)
nRBC: 0 % (ref 0.0–0.2)
nRBC: 0 % (ref 0.0–0.2)

## 2022-10-16 LAB — BASIC METABOLIC PANEL
Anion gap: 13 (ref 5–15)
BUN: 13 mg/dL (ref 8–23)
CO2: 17 mmol/L — ABNORMAL LOW (ref 22–32)
Calcium: 8.6 mg/dL — ABNORMAL LOW (ref 8.9–10.3)
Chloride: 105 mmol/L (ref 98–111)
Creatinine, Ser: 1.46 mg/dL — ABNORMAL HIGH (ref 0.61–1.24)
GFR, Estimated: 48 mL/min — ABNORMAL LOW (ref >60)
Glucose, Bld: 103 mg/dL — ABNORMAL HIGH (ref 70–99)
Potassium: 4.4 mmol/L (ref 3.5–5.1)
Sodium: 135 mmol/L (ref 135–145)

## 2022-10-16 LAB — HEPARIN LEVEL (UNFRACTIONATED)
Heparin Unfractionated: 0.84 [IU]/mL — ABNORMAL HIGH (ref 0.30–0.70)
Heparin Unfractionated: 0.52 [IU]/mL (ref 0.30–0.70)
Heparin Unfractionated: 0.13 [IU]/mL — ABNORMAL LOW (ref 0.30–0.70)

## 2022-10-16 LAB — MAGNESIUM: Magnesium: 1.8 mg/dL (ref 1.7–2.4)

## 2022-10-16 LAB — GLUCOSE, CAPILLARY: Glucose-Capillary: 134 mg/dL — ABNORMAL HIGH (ref 70–99)

## 2022-10-16 NOTE — Evaluation (Signed)
Physical Therapy Evaluation Patient Details Name: Frederick Burnett MRN: [id number] DOB: 1940/08/02 Today's Date: 10/16/2022  History of Present Illness  82 y.o. male presents to [facility] hospital on 10/14/2022 after outpatient CT scan showed possible filling deficits in pulmonary artery. CTA in ED shows large PE with clot burden and increased RV size. PMH includes CAD, AVR, PE, HLD, colon mass.  Clinical Impression  Pt presents to PT at or near his functional baseline. Pt is mobilizing independently and reports no DOE or significant exertion when ambulating for limited community distances. Pt denies concerns about mobility at this time. PT encourages frequent mobilization in an effort to avoid deconditioning during this admission. PT recommends discharge home when medically appropriate. PT signing off.        If plan is discharge home, recommend the following:     Can travel by private vehicle        Equipment Recommendations None recommended by PT  Recommendations for Other Services       Functional Status Assessment Patient has not had a recent decline in their functional status     Precautions / Restrictions Precautions Precautions: None Restrictions Weight Bearing Restrictions: No      Mobility  Bed Mobility Overal bed mobility: Independent                  Transfers Overall transfer level: Independent                      Ambulation/Gait Ambulation/Gait assistance: Independent Gait Distance (Feet): 500 Feet Assistive device: None Gait Pattern/deviations: WFL(Within Functional Limits) Gait velocity: functional Gait velocity interpretation: >2.62 ft/sec, indicative of community ambulatory   General Gait Details: steady step-through gait  Stairs            Wheelchair Mobility     Tilt Bed    Modified Rankin (Stroke Patients Only)       Balance Overall balance assessment: Independent                                            Pertinent Vitals/Pain Pain Assessment Pain Assessment: No/denies pain    Home Living Family/patient expects to be discharged to:: Private residence Living Arrangements: Children;Other relatives Available Help at Discharge: Family;Available PRN/intermittently Type of Home: House Home Access: Stairs to enter Entrance Stairs-Rails: Can reach both Entrance Stairs-Number of Steps: 3   Home Layout: One level Home Equipment: None      Prior Function Prior Level of Function : Independent/Modified Independent             Mobility Comments: ambulates without device, driving       Extremity/Trunk Assessment   Upper Extremity Assessment Upper Extremity Assessment: Overall WFL for tasks assessed    Lower Extremity Assessment Lower Extremity Assessment: Overall WFL for tasks assessed    Cervical / Trunk Assessment Cervical / Trunk Assessment: Normal  Communication   Communication Communication: No apparent difficulties Cueing Techniques: Verbal cues  Cognition Arousal: Alert Behavior During Therapy: WFL for tasks assessed/performed Overall Cognitive Status: Within Functional Limits for tasks assessed                                          General Comments General comments (skin integrity, edema, etc.): pt with  mild tachycardia, into 120s with ambulation. SpO2 stable in mid 90s throughout session. Pt denies DOE or increased exertion during session    Exercises     Assessment/Plan    PT Assessment Patient does not need any further PT services  PT Problem List         PT Treatment Interventions      PT Goals (Current goals can be found in the Care Plan section)       Frequency       Co-evaluation               AM-PAC PT "6 Clicks" Mobility  Outcome Measure Help needed turning from your back to your side while in a flat bed without using bedrails?: None Help needed moving from lying on your back to sitting on the side of a  flat bed without using bedrails?: None Help needed moving to and from a bed to a chair (including a wheelchair)?: None Help needed standing up from a chair using your arms (e.g., wheelchair or bedside chair)?: None Help needed to walk in hospital room?: None Help needed climbing 3-5 steps with a railing? : None 6 Click Score: 24    End of Session   Activity Tolerance: Patient tolerated treatment well Patient left: in bed;with call bell/phone within reach;with bed alarm set Nurse Communication: Mobility status PT Visit Diagnosis: Other abnormalities of gait and mobility (R26.89)    Time: 1610-9604 PT Time Calculation (min) (ACUTE ONLY): 20 min   Charges:   PT Evaluation $PT Eval Low Complexity: 1 Low   PT General Charges $$ ACUTE PT VISIT: 1 Visit         Arlyss Gandy, PT, DPT Acute Rehabilitation Office [phone]   Arlyss Gandy 10/16/2022, 8:53 AM

## 2022-10-16 NOTE — Progress Notes (Signed)
PROGRESS NOTE    Frederick Burnett  ZOX:[id number] DOB: Jul 04, 1940 DOA: 10/14/2022 PCP: [facility]   Brief Narrative:  Patient is a 82 years old male with past medical history of coronary artery disease status post stent, aortic valve replacement, history of pulmonary embolism., hyperlipidemia, colon mass under investigation was sent to ED after outpatient CT scan showing possible filling defect in the pulmonary artery.  Patient did have a history of pulmonary embolism in the past but was not on blood thinners.  Patient denies any chest pain, shortness of breath, nausea, vomiting, fever, shortness of breath.  Denies any leg pain or swelling.  Denies any nausea vomiting or abdominal pain.  Denied any hematemesis or melena.  In the ED CTA of the chest was done which showed large pulmonary embolism with clot burden with increased RV size.  BNP and troponin was normal.  Patient did have a previous 2D echocardiogram with chronic right-sided changes.  He was asymptomatic on room air and not tachycardic with normal pulse ox.  Initially PCCM was consulted but since he was hemodynamically stable medical team was consulted for admission to the hospital.   Assessment & Plan:   Active Problems:   Essential hypertension   S/P AVR   History of pulmonary embolism   History of DVT (deep vein thrombosis)   Acute pulmonary embolism (HCC)   Hyperlipidemia   Multiple pulmonary emboli (HCC)  Presumed submassive PE.  CTA of the chest showed a large pulmonary embolism bilaterally with clot burden and right sided heart strain.  Possible RV clot.  Initial troponin normal.  BNP normal.  Transthoracic echo did not show any right heart strain or any clot in the right ventricle.  Patient asymptomatic and saturating well without oxygen.  He was also seen by PCCM, no thrombolytics were recommended.  Although patient is feeling well and asymptomatic but he is tachycardic with rates of 120s even with  resting.  Due to this, we will continue heparin for another 24 hours and observe overnight and reassess tomorrow morning for possible transition to Eliquis and discharge.  Abnormal EKG: Prolonged PR interval.  Will repeat EKG today.  He is asymptomatic.   Colon mass.  Under investigation as outpatient.  Patient is scheduled to undergo colectomy after cardiac clearance.  Staging CT today showed possible pulmonary embolism.  Acute blood loss anemia: Hemoglobin dropped to 11.3.  Pending CBC.  DVT prophylaxis: Heparin   Code Status: Full Code  Family Communication: Multiple family members present at bedside but they were not talking, plan of care discussed with the patient in detail and he verbalized understanding.  Status is: Inpatient Remains inpatient appropriate because: Needs observation on IV heparin for another 24 hours at least.   Estimated body mass index is 28.19 kg/m as calculated from the following:   Height as of this encounter: 5\' 9"  (1.753 m).   Weight as of this encounter: 86.6 kg.    Nutritional Assessment: Body mass index is 28.19 kg/m.Marland Kitchen Seen by dietician.  I agree with the assessment and plan as outlined below: Nutrition Status:        . Skin Assessment: I have examined the patient's skin and I agree with the wound assessment as performed by the wound care RN as outlined below:    Consultants:  PCCM  Procedures:  None  Antimicrobials:  Anti-infectives (From admission, onward)    None         Subjective: Patient seen and examined.  He is sitting at the edge of the bed and to family members are at the bedside.  Patient is extremely frustrated and has several complaints regarding the care that he is receiving from the staff.  He tells me that he keeps calling them and even after multiple calls, no one shows up and he had to go to the bathroom earlier this morning.  For that reason and frustration, he initially demanded discharge.  After counseling and  reassuring that I will convey his complaints to the nursing staff on the floor, he was okay with staying overnight.  Objective: Vitals:   10/16/22 0316 10/16/22 0400 10/16/22 0833 10/16/22 0835  BP: 112/80  118/82 118/82  Pulse: 92  86 88  Resp: 18  18   Temp: 98.4 F (36.9 C)  98.1 F (36.7 C)   TempSrc: Oral  Oral   SpO2: 100% 99%  97%  Weight:      Height:        Intake/Output Summary (Last 24 hours) at 10/16/2022 1012 Last data filed at 10/16/2022 0838 Gross per 24 hour  Intake 243.54 ml  Output 1100 ml  Net -856.46 ml   Filed Weights   10/14/22 2107 10/15/22 2150  Weight: 95.7 kg 86.6 kg    Examination:  General exam: Appears calm and comfortable  Respiratory system: Clear to auscultation. Respiratory effort normal. Cardiovascular system: S1 & S2 heard, sinus tachycardia. No JVD, murmurs, rubs, gallops or clicks. No pedal edema. Gastrointestinal system: Abdomen is nondistended, soft and nontender. No organomegaly or masses felt. Normal bowel sounds heard. Central nervous system: Alert and oriented. No focal neurological deficits. Extremities: Symmetric 5 x 5 power. Skin: No rashes, lesions or ulcers Psychiatry: Judgement and insight appear normal. Mood & affect appropriate.    Data Reviewed: I have personally reviewed following labs and imaging studies  CBC: Recent Labs  Lab 10/14/22 2215 10/16/22 0315  WBC 6.2 6.3  NEUTROABS 3.6  --   HGB 13.1 11.0*  HCT 41.1 32.4*  MCV 87.8 82.9  PLT 177 89*   Basic Metabolic Panel: Recent Labs  Lab 10/14/22 2215 10/14/22 2325 10/15/22 0510 10/16/22 0315  NA 136  --   --  135  K 5.9*  --  3.6 4.4  CL 105  --   --  105  CO2 20*  --   --  17*  GLUCOSE 92  --   --  103*  BUN 17  --   --  13  CREATININE 1.49* 1.50*  --  1.46*  CALCIUM 9.0  --   --  8.6*  MG  --   --   --  1.8   GFR: Estimated Creatinine Clearance: 42.5 mL/min (A) (by C-G formula based on SCr of 1.46 mg/dL (H)). Liver Function Tests: Recent  Labs  Lab 10/14/22 2215  AST 53*  ALT 24  ALKPHOS 100  BILITOT 1.6*  PROT 7.9  ALBUMIN 4.2   No results for input(s): "LIPASE", "AMYLASE" in the last 168 hours. No results for input(s): "AMMONIA" in the last 168 hours. Coagulation Profile: No results for input(s): "INR", "PROTIME" in the last 168 hours. Cardiac Enzymes: No results for input(s): "CKTOTAL", "CKMB", "CKMBINDEX", "TROPONINI" in the last 168 hours. BNP (last 3 results) No results for input(s): "PROBNP" in the last 8760 hours. HbA1C: No results for input(s): "HGBA1C" in the last 72 hours. CBG: No results for input(s): "GLUCAP" in the last 168 hours. Lipid Profile: No results for input(s): "CHOL", "HDL", "  LDLCALC", "TRIG", "CHOLHDL", "LDLDIRECT" in the last 72 hours. Thyroid Function Tests: No results for input(s): "TSH", "T4TOTAL", "FREET4", "T3FREE", "THYROIDAB" in the last 72 hours. Anemia Panel: No results for input(s): "VITAMINB12", "FOLATE", "FERRITIN", "TIBC", "IRON", "RETICCTPCT" in the last 72 hours. Sepsis Labs: No results for input(s): "PROCALCITON", "LATICACIDVEN" in the last 168 hours.  No results found for this or any previous visit (from the past 240 hour(s)).   Radiology Studies: ECHOCARDIOGRAM COMPLETE  Result Date: 10/15/2022    ECHOCARDIOGRAM REPORT   Patient Name:   RENTON HANDRICK Date of Exam: 10/15/2022 Medical Rec #:  [id number]      Height:       68.0 in Accession #:    [id number]     Weight:       211.0 lb Date of Birth:  October 31, 1940      BSA:          2.091 m Patient Age:    82 years       BP:           123/84 mmHg Patient Gender: M              HR:           80 bpm. Exam Location:  Inpatient Procedure: 2D Echo, Color Doppler and Cardiac Doppler Indications:    I26.02 Pulmonary embolus  History:        Patient has prior history of Echocardiogram examinations, most                 recent 05/15/2022. Risk Factors:Hypertension.                 Aortic Valve: 23 mm Magna valve is present in the aortic                  position. Procedure Date: 04/11/14.  Sonographer:    Irving Burton Senior RDCS Referring Phys: [phone] MATTHEW R HUNSUCKER IMPRESSIONS  1. Left ventricular ejection fraction, by estimation, is 60 to 65%. The left ventricle has normal function. The left ventricle has no regional wall motion abnormalities. There is severe left ventricular hypertrophy of the basal-septal segment. Left ventricular diastolic parameters are consistent with Grade I diastolic dysfunction (impaired relaxation).  2. Right ventricular systolic function is normal. The right ventricular size is mildly enlarged. There is normal pulmonary artery systolic pressure. The estimated right ventricular systolic pressure is 28.2 mmHg.  3. The mitral valve is normal in structure. Trivial mitral valve regurgitation. No evidence of mitral stenosis.  4. The aortic valve has been repaired/replaced Aortic valve regurgitation is not visualized. There is a 23 mm Magna valve present in the aortic position. Procedure Date: 04/11/14. Aortic valve area, by VTI measures 1.10 cm. Aortic valve mean gradient measures 11.0 mmHg. Aortic valve Vmax measures 2.08 m/s.  5. Aortic dilatation noted. There is moderate dilatation of the aortic root, measuring 48 mm.  6. The inferior vena cava is normal in size with greater than 50% respiratory variability, suggesting right atrial pressure of 3 mmHg.  7. Ascending aorta measurements are within normal limits for age when indexed to body surface area. FINDINGS  Left Ventricle: Left ventricular ejection fraction, by estimation, is 60 to 65%. The left ventricle has normal function. The left ventricle has no regional wall motion abnormalities. The left ventricular internal cavity size was normal in size. There is  severe left ventricular hypertrophy of the basal-septal segment. Left ventricular diastolic parameters are consistent with Grade I diastolic dysfunction (impaired  relaxation). Normal left ventricular filling pressure.  Right Ventricle: The right ventricular size is mildly enlarged. No increase in right ventricular wall thickness. Right ventricular systolic function is normal. There is normal pulmonary artery systolic pressure. The tricuspid regurgitant velocity is 2.51  m/s, and with an assumed right atrial pressure of 3 mmHg, the estimated right ventricular systolic pressure is 28.2 mmHg. Left Atrium: Left atrial size was normal in size. Right Atrium: Right atrial size was normal in size. Pericardium: There is no evidence of pericardial effusion. Mitral Valve: The mitral valve is normal in structure. Trivial mitral valve regurgitation. No evidence of mitral valve stenosis. Tricuspid Valve: The tricuspid valve is normal in structure. Tricuspid valve regurgitation is mild . No evidence of tricuspid stenosis. Aortic Valve: The aortic valve has been repaired/replaced. Aortic valve regurgitation is not visualized. Aortic valve mean gradient measures 11.0 mmHg. Aortic valve peak gradient measures 17.3 mmHg. Aortic valve area, by VTI measures 1.10 cm. There is a  23 mm Magna valve present in the aortic position. Procedure Date: 04/11/14. Pulmonic Valve: The pulmonic valve was normal in structure. Pulmonic valve regurgitation is not visualized. No evidence of pulmonic stenosis. Aorta: Aortic dilatation noted. Ascending aorta measurements are within normal limits for age when indexed to body surface area. There is moderate dilatation of the aortic root, measuring 48 mm. Venous: The inferior vena cava is normal in size with greater than 50% respiratory variability, suggesting right atrial pressure of 3 mmHg. IAS/Shunts: No atrial level shunt detected by color flow Doppler.  LEFT VENTRICLE PLAX 2D LVIDd:         3.70 cm   Diastology LVIDs:         2.70 cm   LV e' medial:    6.74 cm/s LV PW:         1.30 cm   LV E/e' medial:  7.2 LV IVS:        2.12 cm   LV e' lateral:   8.49 cm/s LVOT diam:     2.10 cm   LV E/e' lateral: 5.7 LV SV:          45 LV SV Index:   21 LVOT Area:     3.46 cm  RIGHT VENTRICLE RV S prime:     11.10 cm/s TAPSE (M-mode): 2.1 cm LEFT ATRIUM             Index        RIGHT ATRIUM           Index LA diam:        3.00 cm 1.43 cm/m   RA Area:     17.40 cm LA Vol (A2C):   53.7 ml 25.69 ml/m  RA Volume:   45.90 ml  21.95 ml/m LA Vol (A4C):   53.0 ml 25.35 ml/m LA Biplane Vol: 55.0 ml 26.31 ml/m  AORTIC VALVE AV Area (Vmax):    1.26 cm AV Area (Vmean):   1.17 cm AV Area (VTI):     1.10 cm AV Vmax:           208.00 cm/s AV Vmean:          163.000 cm/s AV VTI:            0.406 m AV Peak Grad:      17.3 mmHg AV Mean Grad:      11.0 mmHg LVOT Vmax:         75.50 cm/s LVOT Vmean:  54.850 cm/s LVOT VTI:          0.130 m LVOT/AV VTI ratio: 0.32  AORTA Ao Root diam: 3.20 cm Ao Asc diam:  4.30 cm MITRAL VALVE               TRICUSPID VALVE MV Area (PHT): 3.58 cm    TR Peak grad:   25.2 mmHg MV Decel Time: 212 msec    TR Vmax:        251.00 cm/s MV E velocity: 48.50 cm/s MV A velocity: 77.50 cm/s  SHUNTS MV E/A ratio:  0.63        Systemic VTI:  0.13 m                            Systemic Diam: 2.10 cm Armanda Magic MD Electronically signed by Armanda Magic MD Signature Date/Time: 10/15/2022/10:06:01 AM    Final    CT Angio Chest PE W and/or Wo Contrast  Result Date: 10/15/2022 CLINICAL DATA:  82 year old male with filling defect in a pulmonary artery branch on prior CT of the abdomen and pelvis. Follow-up study to assess for pulmonary embolism. EXAM: CT ANGIOGRAPHY CHEST WITH CONTRAST TECHNIQUE: Multidetector CT imaging of the chest was performed using the standard protocol during bolus administration of intravenous contrast. Multiplanar CT image reconstructions and MIPs were obtained to evaluate the vascular anatomy. RADIATION DOSE REDUCTION: This exam was performed according to the departmental dose-optimization program which includes automated exposure control, adjustment of the mA and/or kV according to patient size and/or use  of iterative reconstruction technique. CONTRAST:  75mL OMNIPAQUE IOHEXOL 350 MG/ML SOLN COMPARISON:  Chest CTA 04/24/2014. CT of the abdomen and pelvis 10/14/2022. FINDINGS: Cardiovascular: There are multiple filling defects within pulmonary artery branches in the lungs bilaterally indicative of pulmonary embolism. There is extensive clot bilaterally, with the largest burden in the right lower lobe distribution involving the right lower lobe pulmonary artery and all segmental branches of the right lower lobe basal segments, many of which appear completely occlusive. Additional filling defects are noted on the left involving both left upper and lower lobe distributions, both occlusive and nonocclusive. There is also a potential filling defect in the right ventricle best appreciated on axial images 215 and 216 of series 7, concerning for thrombus. Estimated right ventricular diameter of 54 mm. Estimated left ventricular diameter of 28 mm. RV to LV ratio of 1.93. There is no significant pericardial fluid, thickening or pericardial calcification. There is aortic atherosclerosis, as well as atherosclerosis of the great vessels of the mediastinum and the coronary arteries, including calcified atherosclerotic plaque in the left main, left anterior descending, left circumflex and right coronary arteries. Status post median sternotomy for aortic valve replacement with what appears to be a stented bioprosthesis. Aneurysmal dilatation of the ascending aorta above the anastomosis of the graft estimated to measure 4.9 cm in diameter. Mediastinum/Nodes: No pathologically enlarged mediastinal or hilar lymph nodes. Small hiatal hernia. No axillary lymphadenopathy. Lungs/Pleura: No acute consolidative airspace disease. No pleural effusions. Scattered small pulmonary nodules, largest of which is in the posterior aspect of the right lower lobe (axial image 95 of series 6) measuring 6 mm. Upper Abdomen: Aortic atherosclerosis.  Musculoskeletal: Median sternotomy wires. There are no aggressive appearing lytic or blastic lesions noted in the visualized portions of the skeleton. Review of the MIP images confirms the above findings. IMPRESSION: 1. Study is positive for large burden of occlusive and nonocclusive pulmonary embolism  in the lungs bilaterally, probable clot in the right ventricle and CT evidence of right heart strain (RV/LV Ratio = 1.93) consistent with at least submassive (intermediate risk) PE. The presence of right heart strain has been associated with an increased risk of morbidity and mortality. Please refer to the "Code PE Focused" order set in EPIC. 2. Small pulmonary nodules measuring 6 mm or less in size. Non-contrast chest CT at 6 months is recommended. If the nodules are stable at time of repeat CT, then future CT at 18-24 months (from today's scan) is considered optional for low-risk patients, but is recommended for high-risk patients. This recommendation follows the consensus statement: Guidelines for Management of Incidental Pulmonary Nodules Detected on CT Images: From the Fleischner Society 2017; Radiology 2017; 284:228-243. 3. Aortic atherosclerosis, in addition to left main and three-vessel coronary artery disease. Please note that although the presence of coronary artery calcium documents the presence of coronary artery disease, the severity of this disease and any potential stenosis cannot be assessed on this non-gated CT examination. Assessment for potential risk factor modification, dietary therapy or pharmacologic therapy may be warranted, if clinically indicated. 4. Aneurysmal dilatation of the ascending thoracic aorta (4.9 cm in diameter). Recommend semi-annual imaging followup by CTA or MRA and referral to cardiothoracic surgery if not already obtained. This recommendation follows 2010 ACCF/AHA/AATS/ACR/ASA/SCA/SCAI/SIR/STS/SVM Guidelines for the Diagnosis and Management of Patients With Thoracic Aortic  Disease. Circulation. 2010; 121: U132-G401. Aortic aneurysm NOS (ICD10-I71.9) Critical Value/emergent results were called by telephone at the time of interpretation on 10/15/2022 at 5:37 am to provider [facility], who verbally acknowledged these results. Aortic Atherosclerosis (ICD10-I70.0). Electronically Signed   By: Trudie Reed M.D.   On: 10/15/2022 05:39   CT ABDOMEN PELVIS W CONTRAST  Result Date: 10/14/2022 CLINICAL DATA:  Descending colon mass.  * Tracking Code: BO * EXAM: CT ABDOMEN AND PELVIS WITH CONTRAST TECHNIQUE: Multidetector CT imaging of the abdomen and pelvis was performed using the standard protocol following bolus administration of intravenous contrast. RADIATION DOSE REDUCTION: This exam was performed according to the departmental dose-optimization program which includes automated exposure control, adjustment of the mA and/or kV according to patient size and/or use of iterative reconstruction technique. CONTRAST:  75mL OMNIPAQUE IOHEXOL 350 MG/ML SOLN COMPARISON:  CTA chest dated 04/16/2014, 01/05/2013 FINDINGS: Lower chest: Apparent central filling defect within right lower lobe posterior segmental pulmonary artery (3:3). No pleural effusion or pneumothorax demonstrated. Partially imaged heart size is normal. Coronary artery calcification. Hepatobiliary: No focal hepatic lesions. No intra or extrahepatic biliary ductal dilation. Cholelithiasis. Pancreas: No focal lesions or main ductal dilation. Spleen: Normal in size without focal abnormality. Adrenals/Urinary Tract: 1.7 cm right adrenal nodule (3:26) measures 105 HU and is unchanged from 01/05/2013, likely adenoma. No specific follow-up imaging recommended. No left adrenal nodule. No suspicious renal mass, calculi or hydronephrosis. No focal bladder wall thickening. Stomach/Bowel: Normal appearance of the stomach. Mildly featureless appearance of the splenic flexure with suspected mural thickening (3:21) may correspond to known  descending colon mass. Normal appendix. Vascular/Lymphatic: Aortic atherosclerosis. No enlarged abdominal or pelvic lymph nodes. Reproductive: Enlargement of the prostate with median lobe hypertrophy. Other: No free fluid, fluid collection, or free air. Musculoskeletal: No acute or abnormal lytic or blastic osseous lesions. Multilevel degenerative changes of the partially imaged thoracic and lumbar spine. Grade 1 anterolisthesis at L3-4 and grade 1 retrolisthesis at L5-S1. Small fat-containing paraumbilical and bilateral inguinal hernias code:Marland Kitchen IMPRESSION: 1. Apparent central filling defect within right lower lobe posterior segmental  pulmonary artery, which may represent a pulmonary embolism. Recommend correlation with PE protocol CTA chest. 2. Mildly featureless appearance of the splenic flexure with suspected mural thickening may correspond to known descending colon mass. 3. No evidence of metastatic disease in the abdomen or pelvis. 4. Cholelithiasis. 5. Enlargement of the prostate with median lobe hypertrophy. 6.  Aortic Atherosclerosis (ICD10-I70.0). Critical Value/emergent results were called by telephone at the time of interpretation on 10/14/2022 at 7:04 pm to provider Abigail Miyamoto, who verbally acknowledged these results. Electronically Signed   By: Agustin Cree M.D.   On: 10/14/2022 19:12    Scheduled Meds:  atorvastatin  20 mg Oral Daily   Continuous Infusions:  heparin 1,050 Units/hr (10/16/22 0503)     LOS: 1 day   Hughie Closs, MD [facility]  10/16/2022, 10:12 AM   *Please note that this is a verbal dictation therefore any spelling or grammatical errors are due to the "Dragon Medical One" system interpretation.  Please page via Amion and do not message via secure chat for urgent patient care matters. Secure chat can be used for non urgent patient care matters.  How to contact the [facility] Attending or Consulting provider 7A - 7P or covering provider during after hours 7P -7A, for  this patient?  Check the care team in [facility] and look for a) attending/consulting [facility] provider listed and b) the [facility] team listed. Page or secure chat 7A-7P. Log into [email or web address] and use [place]'s universal password to access. If you do not have the password, please contact the hospital operator. Locate the [facility] provider you are looking for under [facility] and page to a number that you can be directly reached. If you still have difficulty reaching the provider, please page the [facility] (Director on Call) for the Hospitalists listed on amion for assistance.

## 2022-10-16 NOTE — Plan of Care (Signed)
  Problem: Education: Goal: Understanding of discharge needs will improve Outcome: Progressing Goal: Verbalization of understanding of the causes of altered bowel function will improve Outcome: Progressing   Problem: Activity: Goal: Ability to tolerate increased activity will improve Outcome: Progressing

## 2022-10-16 NOTE — Progress Notes (Signed)
ANTICOAGULATION CONSULT NOTE - Follow-up Note  Pharmacy Consult for Heparin Indication: pulmonary embolus  No Known Allergies  Patient Measurements: Height: 5\' 9"  (175.3 cm) Weight: 86.6 kg (190 lb 14.4 oz) IBW/kg (Calculated) : 70.7 Heparin Dosing Weight: 88.6 kg  Vital Signs: Temp: 98.4 F (36.9 C) (08/21 0316) Temp Source: Oral (08/21 0316) BP: 112/80 (08/21 0316) Pulse Rate: 92 (08/21 0316)  Labs: Recent Labs    10/14/22 2215 10/14/22 2325 10/15/22 0510 10/15/22 0637 10/15/22 1615 10/16/22 0315  HGB 13.1  --   --   --   --  11.0*  HCT 41.1  --   --   --   --  32.4*  PLT 177  --   --   --   --  89*  HEPARINUNFRC  --   --   --   --  >1.10* 0.84*  CREATININE 1.49* 1.50*  --   --   --  1.46*  TROPONINIHS  --   --  8 10  --   --     Estimated Creatinine Clearance: 42.5 mL/min (A) (by C-G formula based on SCr of 1.46 mg/dL (H)).   Medical History: Past Medical History:  Diagnosis Date   Aortic regurgitation    Aortic regurgitation    Aortic root enlargement (HCC)    Arthritis    both shoulders - RC- wear   Heart murmur    History of pulmonary embolism 04/24/2014   Provoked; occurred after AVR in 2016   HTN (hypertension)    Hx of echocardiogram    Echo 3/16: mild LVH, EF 40-45%, ant-septal AK, AVR with elevated velocity (3.3 m/s - slightly above expected value), mild LAE //  Echo 3/17: Mild LVH, EF 40%, diffuse HK, indeterminate diastolic function, bioprosthetic AVR well-functioning (mean gradient 9 mmHg), MAC, mild LAE, mildly reduced RVSF, mild RAE   Hypercholesteremia     Medications:  Medications Prior to Admission  Medication Sig Dispense Refill Last Dose   amLODipine (NORVASC) 5 MG tablet Take 1 tablet (5 mg total) by mouth daily. Call and schedule follow up appt to receive further refills. [phone] 3rd attempt 15 tablet 0 10/14/2022   aspirin EC 81 MG tablet Take 1 tablet (81 mg total) by mouth daily. (Patient taking differently: Take 81 mg by mouth  at bedtime.) 90 tablet 3 Past Week   atorvastatin (LIPITOR) 20 MG tablet Take 1 tablet (20 mg total) by mouth daily. Please schedule appointment for future refills. 3rd and final attempt. Thank you (Patient taking differently: Take 20 mg by mouth at bedtime.) 15 tablet 0 Past Week   lisinopril-hydrochlorothiazide (ZESTORETIC) 10-12.5 MG tablet Take 1 tablet by mouth daily.   10/14/2022   metoprolol tartrate (LOPRESSOR) 25 MG tablet Take 1 tablet (25 mg total) by mouth 2 (two) times daily. 60 tablet 3 10/14/2022   Multiple Vitamins-Minerals (CENTRUM SILVER 50+MEN) TABS Take 1 tablet by mouth daily.   10/14/2022   spironolactone (ALDACTONE) 25 MG tablet Take 1 tablet (25 mg total) by mouth daily. 90 tablet 2 10/14/2022   metroNIDAZOLE (FLAGYL) 500 MG tablet PLEASE SEE ATTACHED FOR DETAILED DIRECTIONS (Patient not taking: Reported on 10/15/2022)   Not Taking   neomycin (MYCIFRADIN) 500 MG tablet Take 1,000 mg by mouth 3 (three) times daily. (Patient not taking: Reported on 10/15/2022)   Not Taking   Scheduled:   atorvastatin  20 mg Oral Daily   Infusions:   heparin 1,250 Units/hr (10/15/22 1829)   PRN:   Assessment: 82  yom with a history of CAD, AVR, PE, HLD, colon mass. Patient is presenting with possible PE. Patient has history of PE (2016) with no anticoagulation. He was being worked up for a mass in his colon and had a CT abdomen pelvis today which showed a possible filling defect in his pulmonary artery. CTA showed large burden of occlusive and nonocclusive pulmonary embolism in the lungs bilaterally with right heart strain (RV/LV ratio= 1.93) . Heparin per pharmacy consult placed for pulmonary embolus.  8/21 AM: heparin level returned at 0.84 on 1250 units/hr (supratherapeutic). Per RN no signs/symptoms of bleeding or issues with the heparin infusion. Hgb 13>11 and plts 177>89-will continue to monitor  Goal of Therapy:  Heparin level 0.3-0.7 units/ml Monitor platelets by anticoagulation  protocol: Yes   Plan:  Decrease heparin infusion to 1050 units/hr Check anti-Xa level in 8h and daily while on heparin Continue to monitor H&H and platelets  Arabella Merles, PharmD. Clinical Pharmacist 10/16/2022 5:04 AM

## 2022-10-16 NOTE — Plan of Care (Signed)

## 2022-10-16 NOTE — TOC Initial Note (Signed)
Transition of Care [facility]) - Initial/Assessment Note    Patient Details  Name: Frederick Burnett MRN: [id number] Date of Birth: 11-27-1940  Transition of Care [facility]) CM/SW Contact:    Lawerance Sabal, RN Phone Number: 10/16/2022, 12:04 PM  Clinical Narrative:                  Chart reviewed.  Patient admitted w PE. Test claim ran for Eliquis, $47, please send initial script for DC through [facility] pharmacy (closed Sundays).  Patient has insurance coverage and PCP.  Anticipate DC to home with transportation from family  Expected Discharge Plan: Home/Self Care Barriers to Discharge: Continued Medical Work up   Patient Goals and CMS Choice Patient states their goals for this hospitalization and ongoing recovery are:: return home   Choice offered to / list presented to : NA      Expected Discharge Plan and Services In-house Referral: NA Discharge Planning Services: CM Consult Post Acute Care Choice: NA Living arrangements for the past 2 months: Single Family Home                 DME Arranged: N/A         HH Arranged: NA          Prior Living Arrangements/Services Living arrangements for the past 2 months: Single Family Home Lives with:: Relatives                   Activities of Daily Living Home Assistive Devices/Equipment: Blood pressure cuff, Eyeglasses, Grab bars in shower ADL Screening (condition at time of admission) Patient's cognitive ability adequate to safely complete daily activities?: Yes Is the patient deaf or have difficulty hearing?: No Does the patient have difficulty seeing, even when wearing glasses/contacts?: No Does the patient have difficulty concentrating, remembering, or making decisions?: No Patient able to express need for assistance with ADLs?: Yes Does the patient have difficulty dressing or bathing?: No Independently performs ADLs?: Yes (appropriate for developmental age) Does the patient have difficulty walking or climbing stairs?:  No Weakness of Legs: None Weakness of Arms/Hands: None  Permission Sought/Granted                  Emotional Assessment              Admission diagnosis:  Acute pulmonary embolism (HCC) [I26.99] Multiple pulmonary emboli (HCC) [I26.99] Patient Active Problem List   Diagnosis Date Noted   Acute pulmonary embolism (HCC) 10/15/2022   Hyperlipidemia 10/15/2022   Multiple pulmonary emboli (HCC) 10/15/2022   Coronary artery disease involving native coronary artery of native heart without angina pectoris 12/27/2016   NICM (nonischemic cardiomyopathy) (HCC) 05/19/2014   Pleuritic chest pain    History of pulmonary embolism 04/24/2014   History of DVT (deep vein thrombosis) 04/24/2014   S/P AVR 04/11/2014   Aortic root enlargement (HCC) 12/29/2012    Class: Chronic   Essential hypertension 12/29/2012    Class: Chronic   PCP:  [facility], [facility]:   [phone] - [place] - [address] AT [address]NER OF [address] [place] [address] Phone: [phone] Fax: [phone]  [id number] [place], [place] - [address] AT [facility] [address]  [place] [address] Phone: [phone] Fax: [phone]     Social Determinants of Health (SDOH) Social History: SDOH Screenings   Food Insecurity: No Food Insecurity (10/15/2022)  Housing: Low Risk  (10/15/2022)  Transportation Needs: No Transportation Needs (10/15/2022)  Utilities: Not At Risk (10/15/2022)  Tobacco Use: Medium Risk (10/14/2022)   SDOH Interventions:     Readmission Risk Interventions     No data to display

## 2022-10-16 NOTE — Progress Notes (Signed)
ANTICOAGULATION CONSULT NOTE - Follow-up Note  Pharmacy Consult for Heparin Indication: pulmonary embolus  No Known Allergies  Patient Measurements: Height: 5\' 9"  (175.3 cm) Weight: 86.6 kg (190 lb 14.4 oz) IBW/kg (Calculated) : 70.7 Heparin Dosing Weight: 88.6 kg  Vital Signs: Temp: 98.1 F (36.7 C) (08/21 0833) Temp Source: Oral (08/21 0833) BP: 118/82 (08/21 0835) Pulse Rate: 88 (08/21 0835)  Labs: Recent Labs    10/14/22 2215 10/14/22 2325 10/15/22 0510 10/15/22 1610 10/15/22 1615 10/16/22 0315 10/16/22 1234  HGB 13.1  --   --   --   --  11.0* 11.4*  HCT 41.1  --   --   --   --  32.4* 35.8*  PLT 177  --   --   --   --  89* 147*  HEPARINUNFRC  --   --   --   --  >1.10* 0.84* 0.52  CREATININE 1.49* 1.50*  --   --   --  1.46*  --   TROPONINIHS  --   --  8 10  --   --   --     Estimated Creatinine Clearance: 42.5 mL/min (A) (by C-G formula based on SCr of 1.46 mg/dL (H)).   Medical History: Past Medical History:  Diagnosis Date   Aortic regurgitation    Aortic regurgitation    Aortic root enlargement (HCC)    Arthritis    both shoulders - RC- wear   Heart murmur    History of pulmonary embolism 04/24/2014   Provoked; occurred after AVR in 2016   HTN (hypertension)    Hx of echocardiogram    Echo 3/16: mild LVH, EF 40-45%, ant-septal AK, AVR with elevated velocity (3.3 m/s - slightly above expected value), mild LAE //  Echo 3/17: Mild LVH, EF 40%, diffuse HK, indeterminate diastolic function, bioprosthetic AVR well-functioning (mean gradient 9 mmHg), MAC, mild LAE, mildly reduced RVSF, mild RAE   Hypercholesteremia     Medications:  Medications Prior to Admission  Medication Sig Dispense Refill Last Dose   amLODipine (NORVASC) 5 MG tablet Take 1 tablet (5 mg total) by mouth daily. Call and schedule follow up appt to receive further refills. [phone] 3rd attempt 15 tablet 0 10/14/2022   aspirin EC 81 MG tablet Take 1 tablet (81 mg total) by mouth daily.  (Patient taking differently: Take 81 mg by mouth at bedtime.) 90 tablet 3 Past Week   atorvastatin (LIPITOR) 20 MG tablet Take 1 tablet (20 mg total) by mouth daily. Please schedule appointment for future refills. 3rd and final attempt. Thank you (Patient taking differently: Take 20 mg by mouth at bedtime.) 15 tablet 0 Past Week   lisinopril-hydrochlorothiazide (ZESTORETIC) 10-12.5 MG tablet Take 1 tablet by mouth daily.   10/14/2022   metoprolol tartrate (LOPRESSOR) 25 MG tablet Take 1 tablet (25 mg total) by mouth 2 (two) times daily. 60 tablet 3 10/14/2022   Multiple Vitamins-Minerals (CENTRUM SILVER 50+MEN) TABS Take 1 tablet by mouth daily.   10/14/2022   spironolactone (ALDACTONE) 25 MG tablet Take 1 tablet (25 mg total) by mouth daily. 90 tablet 2 10/14/2022   metroNIDAZOLE (FLAGYL) 500 MG tablet PLEASE SEE ATTACHED FOR DETAILED DIRECTIONS (Patient not taking: Reported on 10/15/2022)   Not Taking   neomycin (MYCIFRADIN) 500 MG tablet Take 1,000 mg by mouth 3 (three) times daily. (Patient not taking: Reported on 10/15/2022)   Not Taking   Scheduled:   atorvastatin  20 mg Oral Daily   Infusions:  heparin 1,050 Units/hr (10/16/22 0503)   PRN:   Assessment: 82 yom with a history of CAD, AVR, PE, HLD, colon mass. Patient is presenting with possible PE. Patient has history of PE (2016) with no anticoagulation. He was being worked up for a mass in his colon and had a CT abdomen pelvis today which showed a possible filling defect in his pulmonary artery. CTA showed large burden of occlusive and nonocclusive pulmonary embolism in the lungs bilaterally with right heart strain (RV/LV ratio= 1.93) . Heparin per pharmacy consult placed for pulmonary embolus.  -hg 13.1> 11; repeat today 11.4 and stable -plt= 147 (history of thrombocytopenia)  Goal of Therapy:  Heparin level 0.3-0.7 units/ml Monitor platelets by anticoagulation protocol: Yes   Plan:  -Continue heparin 1050 units/hr -Confirm heparin  level later today -Daily heparin level and CBC  Harland German, PharmD Clinical Pharmacist **Pharmacist phone directory can now be found on [email or web address] ([email or web address]).  Listed under [facility].

## 2022-10-16 NOTE — TOC Benefit Eligibility Note (Signed)
Patient Product/process development scientist completed.    The patient is insured through [employer]. Patient has Medicare and is not eligible for a copay card, but may be able to apply for patient assistance, if available.    Ran test claim for Eliquis 5 mg and the current 30 day co-pay is $47.00.  Ran test claim for Xarelto 20 mg and the current 30 day co-pay is $47.00.   This test claim was processed through [facility]- copay amounts may vary at other pharmacies due to pharmacy/plan contracts, or as the patient moves through the different stages of their insurance plan.     [name], CPHT Pharmacy Technician III Certified Patient Advocate [facility] Patient Advocate Team Direct Number: [phone]  Fax: [phone]

## 2022-10-16 NOTE — Progress Notes (Signed)
OT Cancellation Note  Patient Details Name: Frederick Burnett MRN: [id number] DOB: 1940/12/06   Cancelled Treatment:    Reason Eval/Treat Not Completed: OT screened, no needs identified, will sign off- per PT patient independent with ADLs and mobility. No further OT needs identified. OT will sign off.   Barry Brunner, OT Acute Rehabilitation Services Office [phone]   [name] 10/16/2022, 9:17 AM

## 2022-10-17 DIAGNOSIS — I2699 Other pulmonary embolism without acute cor pulmonale: Secondary | ICD-10-CM | POA: Diagnosis not present

## 2022-10-17 LAB — BASIC METABOLIC PANEL
Anion gap: 10 (ref 5–15)
BUN: 10 mg/dL (ref 8–23)
CO2: 20 mmol/L — ABNORMAL LOW (ref 22–32)
Calcium: 8.8 mg/dL — ABNORMAL LOW (ref 8.9–10.3)
Chloride: 105 mmol/L (ref 98–111)
Creatinine, Ser: 1.25 mg/dL — ABNORMAL HIGH (ref 0.61–1.24)
GFR, Estimated: 57 mL/min — ABNORMAL LOW (ref >60)
Glucose, Bld: 98 mg/dL (ref 70–99)
Potassium: 3.8 mmol/L (ref 3.5–5.1)
Sodium: 135 mmol/L (ref 135–145)

## 2022-10-17 LAB — CBC WITH DIFFERENTIAL/PLATELET
Abs Immature Granulocytes: 0.01 10*3/uL (ref 0.00–0.07)
Basophils Absolute: 0 10*3/uL (ref 0.0–0.1)
Basophils Relative: 0 %
Eosinophils Absolute: 0.2 10*3/uL (ref 0.0–0.5)
Eosinophils Relative: 3 %
HCT: 32.6 % — ABNORMAL LOW (ref 39.0–52.0)
Hemoglobin: 10.7 g/dL — ABNORMAL LOW (ref 13.0–17.0)
Immature Granulocytes: 0 %
Lymphocytes Relative: 20 %
Lymphs Abs: 1.2 10*3/uL (ref 0.7–4.0)
MCH: 28.1 pg (ref 26.0–34.0)
MCHC: 32.8 g/dL (ref 30.0–36.0)
MCV: 85.6 fL (ref 80.0–100.0)
Monocytes Absolute: 0.9 10*3/uL (ref 0.1–1.0)
Monocytes Relative: 15 %
Neutro Abs: 3.8 10*3/uL (ref 1.7–7.7)
Neutrophils Relative %: 62 %
Platelets: 142 10*3/uL — ABNORMAL LOW (ref 150–400)
RBC: 3.81 MIL/uL — ABNORMAL LOW (ref 4.22–5.81)
RDW: 13.2 % (ref 11.5–15.5)
WBC: 6.1 10*3/uL (ref 4.0–10.5)
nRBC: 0 % (ref 0.0–0.2)

## 2022-10-17 LAB — HEPARIN LEVEL (UNFRACTIONATED)
Heparin Unfractionated: 0.23 [IU]/mL — ABNORMAL LOW (ref 0.30–0.70)
Heparin Unfractionated: 0.3 [IU]/mL (ref 0.30–0.70)

## 2022-10-17 MED ORDER — HEPARIN BOLUS VIA INFUSION
1000.0000 [IU] | Freq: Once | INTRAVENOUS | Status: AC
  Administered 2022-10-17: 1000 [IU] via INTRAVENOUS
  Filled 2022-10-17: qty 1000

## 2022-10-17 MED ORDER — HEPARIN (PORCINE) 25000 UT/250ML-% IV SOLN: 1350.0000 [IU]/h | INTRAVENOUS | Status: DC

## 2022-10-17 NOTE — Plan of Care (Signed)
  Problem: Education: Goal: Understanding of discharge needs will improve Outcome: Completed/Met Goal: Verbalization of understanding of the causes of altered bowel function will improve Outcome: Completed/Met   Problem: Bowel/Gastric: Goal: Gastrointestinal status for postoperative course will improve Outcome: Completed/Met

## 2022-10-17 NOTE — Progress Notes (Signed)
ANTICOAGULATION CONSULT NOTE - Follow-up Note  Pharmacy Consult for Heparin Indication: pulmonary embolus  No Known Allergies  Patient Measurements: Height: 5\' 9"  (175.3 cm) Weight: 86.6 kg (190 lb 14.4 oz) IBW/kg (Calculated) : 70.7 Heparin Dosing Weight: 88.6 kg  Vital Signs: Temp: 98.3 F (36.8 C) (08/22 2024) Temp Source: Oral (08/22 2024) BP: 112/82 (08/22 2024) Pulse Rate: 92 (08/22 2024)  Labs: Recent Labs    10/14/22 2325 10/15/22 0510 10/15/22 1610 10/15/22 1615 10/16/22 0315 10/16/22 1234 10/16/22 1903 10/17/22 0953 10/17/22 1916  HGB  --   --   --   --  11.0* 11.4*  --  10.7*  --   HCT  --   --   --   --  32.4* 35.8*  --  32.6*  --   PLT  --   --   --   --  89* 147*  --  142*  --   HEPARINUNFRC  --   --   --    < > 0.84* 0.52 0.13* 0.23* 0.30  CREATININE 1.50*  --   --   --  1.46*  --   --  1.25*  --   TROPONINIHS  --  8 10  --   --   --   --   --   --    < > = values in this interval not displayed.    Estimated Creatinine Clearance: 49.7 mL/min (A) (by C-G formula based on SCr of 1.25 mg/dL (H)).   Medical History: Past Medical History:  Diagnosis Date   Aortic regurgitation    Aortic regurgitation    Aortic root enlargement (HCC)    Arthritis    both shoulders - RC- wear   Heart murmur    History of pulmonary embolism 04/24/2014   Provoked; occurred after AVR in 2016   HTN (hypertension)    Hx of echocardiogram    Echo 3/16: mild LVH, EF 40-45%, ant-septal AK, AVR with elevated velocity (3.3 m/s - slightly above expected value), mild LAE //  Echo 3/17: Mild LVH, EF 40%, diffuse HK, indeterminate diastolic function, bioprosthetic AVR well-functioning (mean gradient 9 mmHg), MAC, mild LAE, mildly reduced RVSF, mild RAE   Hypercholesteremia     Medications:  Medications Prior to Admission  Medication Sig Dispense Refill Last Dose   amLODipine (NORVASC) 5 MG tablet Take 1 tablet (5 mg total) by mouth daily. Call and schedule follow up appt to  receive further refills. [phone] 3rd attempt 15 tablet 0 10/14/2022   aspirin EC 81 MG tablet Take 1 tablet (81 mg total) by mouth daily. (Patient taking differently: Take 81 mg by mouth at bedtime.) 90 tablet 3 Past Week   atorvastatin (LIPITOR) 20 MG tablet Take 1 tablet (20 mg total) by mouth daily. Please schedule appointment for future refills. 3rd and final attempt. Thank you (Patient taking differently: Take 20 mg by mouth at bedtime.) 15 tablet 0 Past Week   lisinopril-hydrochlorothiazide (ZESTORETIC) 10-12.5 MG tablet Take 1 tablet by mouth daily.   10/14/2022   metoprolol tartrate (LOPRESSOR) 25 MG tablet Take 1 tablet (25 mg total) by mouth 2 (two) times daily. 60 tablet 3 10/14/2022   Multiple Vitamins-Minerals (CENTRUM SILVER 50+MEN) TABS Take 1 tablet by mouth daily.   10/14/2022   spironolactone (ALDACTONE) 25 MG tablet Take 1 tablet (25 mg total) by mouth daily. 90 tablet 2 10/14/2022   Scheduled:   atorvastatin  20 mg Oral Daily   Infusions:   heparin  1,250 Units/hr (10/17/22 1904)   PRN:   Assessment: 56 yom with a history of CAD, AVR, PE, HLD, colon mass. Patient is presenting with possible PE. Patient has history of PE (2016) with no anticoagulation. CT on 8/19 shows a PE. Heparin per pharmacy consult placed for pulmonary embolus.  PM: heparin level= 0.3 on 1250 units/hr. No issues with the infusion or bleeding reported per RN.  Goal of Therapy:  Heparin level 0.3-0.7 units/ml Monitor platelets by anticoagulation protocol: Yes   Plan:  -Increase heparin to 1350 units/hr -Heparin level in 8 hours and daily wth CBC daily  Loralee Pacas, PharmD, BCPS 10/17/2022 8:43 PM  Please check AMION for all [facility] Pharmacy phone numbers After 10:00 PM, call Main Pharmacy [phone]

## 2022-10-17 NOTE — Progress Notes (Signed)
SATURATION QUALIFICATIONS: (This note is used to comply with regulatory documentation for home oxygen)  Patient Saturations on Room Air at Rest = 98%  Patient Saturations on Room Air while Ambulating = 96%  HR max 122 while ambulating in the hallway. HR 90s while resting after returning to bed.

## 2022-10-17 NOTE — Progress Notes (Signed)
ANTICOAGULATION CONSULT NOTE - Follow-up Note  Pharmacy Consult for Heparin Indication: pulmonary embolus  No Known Allergies  Patient Measurements: Height: 5\' 9"  (175.3 cm) Weight: 86.6 kg (190 lb 14.4 oz) IBW/kg (Calculated) : 70.7 Heparin Dosing Weight: 88.6 kg  Vital Signs: Temp: 99.4 F (37.4 C) (08/22 0502) Temp Source: Oral (08/22 0502) BP: 138/81 (08/22 0907) Pulse Rate: 99 (08/22 0907)  Labs: Recent Labs    10/14/22 2325 10/15/22 0510 10/15/22 1610 10/15/22 1615 10/16/22 0315 10/16/22 1234 10/16/22 1903 10/17/22 0953  HGB  --   --   --   --  11.0* 11.4*  --  10.7*  HCT  --   --   --   --  32.4* 35.8*  --  32.6*  PLT  --   --   --   --  89* 147*  --  142*  HEPARINUNFRC  --   --   --    < > 0.84* 0.52 0.13* 0.23*  CREATININE 1.50*  --   --   --  1.46*  --   --  1.25*  TROPONINIHS  --  8 10  --   --   --   --   --    < > = values in this interval not displayed.    Estimated Creatinine Clearance: 49.7 mL/min (A) (by C-G formula based on SCr of 1.25 mg/dL (H)).   Medical History: Past Medical History:  Diagnosis Date   Aortic regurgitation    Aortic regurgitation    Aortic root enlargement (HCC)    Arthritis    both shoulders - RC- wear   Heart murmur    History of pulmonary embolism 04/24/2014   Provoked; occurred after AVR in 2016   HTN (hypertension)    Hx of echocardiogram    Echo 3/16: mild LVH, EF 40-45%, ant-septal AK, AVR with elevated velocity (3.3 m/s - slightly above expected value), mild LAE //  Echo 3/17: Mild LVH, EF 40%, diffuse HK, indeterminate diastolic function, bioprosthetic AVR well-functioning (mean gradient 9 mmHg), MAC, mild LAE, mildly reduced RVSF, mild RAE   Hypercholesteremia     Medications:  Medications Prior to Admission  Medication Sig Dispense Refill Last Dose   amLODipine (NORVASC) 5 MG tablet Take 1 tablet (5 mg total) by mouth daily. Call and schedule follow up appt to receive further refills. [phone] 3rd  attempt 15 tablet 0 10/14/2022   aspirin EC 81 MG tablet Take 1 tablet (81 mg total) by mouth daily. (Patient taking differently: Take 81 mg by mouth at bedtime.) 90 tablet 3 Past Week   atorvastatin (LIPITOR) 20 MG tablet Take 1 tablet (20 mg total) by mouth daily. Please schedule appointment for future refills. 3rd and final attempt. Thank you (Patient taking differently: Take 20 mg by mouth at bedtime.) 15 tablet 0 Past Week   lisinopril-hydrochlorothiazide (ZESTORETIC) 10-12.5 MG tablet Take 1 tablet by mouth daily.   10/14/2022   metoprolol tartrate (LOPRESSOR) 25 MG tablet Take 1 tablet (25 mg total) by mouth 2 (two) times daily. 60 tablet 3 10/14/2022   Multiple Vitamins-Minerals (CENTRUM SILVER 50+MEN) TABS Take 1 tablet by mouth daily.   10/14/2022   spironolactone (ALDACTONE) 25 MG tablet Take 1 tablet (25 mg total) by mouth daily. 90 tablet 2 10/14/2022   metroNIDAZOLE (FLAGYL) 500 MG tablet PLEASE SEE ATTACHED FOR DETAILED DIRECTIONS (Patient not taking: Reported on 10/15/2022)   Not Taking   neomycin (MYCIFRADIN) 500 MG tablet Take 1,000 mg by mouth  3 (three) times daily. (Patient not taking: Reported on 10/15/2022)   Not Taking   Scheduled:   atorvastatin  20 mg Oral Daily   Infusions:   heparin 1,050 Units/hr (10/16/22 2116)   PRN:   Assessment: 82 yom with a history of CAD, AVR, PE, HLD, colon mass. Patient is presenting with possible PE. Patient has history of PE (2016) with no anticoagulation. CT on 8/19 shows a PE. Heparin per pharmacy consult placed for pulmonary embolus.  -heparin level= 0.23 on 1050 units/hr -hg 10.7, plt= 142  Goal of Therapy:  Heparin level 0.3-0.7 units/ml Monitor platelets by anticoagulation protocol: Yes   Plan:  -Heparin bolus 1000 units x1 then increase to 1250 units/hr -Heparin level in 8 hours and daily wth CBC daily   Harland German, PharmD Clinical Pharmacist **Pharmacist phone directory can now be found on [email or web address] ([email or web address]).  Listed under  [facility].

## 2022-10-17 NOTE — Progress Notes (Signed)
[place] NOTE    Fred[place] Burnett  [name]:[id number] DOB: 12-22-1940 DOA: 10/14/2022 PCP: [facility]   Brief Narrative:  Patient is a 82 years old male with past medical history of coronary artery disease status post stent, aortic valve replacement, history of pulmonary embolism., hyperlipidemia, colon mass under investigation was sent to ED after outpatient CT scan showing possible filling defect in the pulmonary artery.  Patient did have a history of pulmonary embolism in the past but was not on blood thinners.  Patient denies any chest pain, shortness of breath, nausea, vomiting, fever, shortness of breath.  Denies any leg pain or swelling.  Denies any nausea vomiting or abdominal pain.  Denied any hematemesis or melena.  In the ED CTA of the chest was done which showed large pulmonary embolism with clot burden with increased RV size.  BNP and troponin was normal.  Patient did have a previous 2D echocardiogram with chronic right-sided changes.  He was asymptomatic on room air and not tachycardic with normal pulse ox.  Initially PCCM was consulted but since he was hemodynamically stable medical team was consulted for admission to the hospital.   Assessment & Plan:   Active Problems:   Essential hypertension   S/P AVR   History of pulmonary embolism   History of DVT (deep vein thrombosis)   Acute pulmonary embolism (HCC)   Hyperlipidemia   Multiple pulmonary emboli (HCC)  Presumed submassive PE.  CTA of the chest showed a large pulmonary embolism bilaterally with clot burden and right sided heart strain.  Possible RV clot.  Initial troponin normal.  BNP normal.  Transthoracic echo did not show any right heart strain or any clot in the right ventricle.  Patient asymptomatic and saturating well without oxygen.  He was also seen by PCCM, no thrombolytics were recommended.  Patient continues to feel well with no shortness of breath or chest pain.  He was significantly  tachycardic with a heart rate 120s at rest but better today and 90s at rest however he jumped to 122 with walking.  Still appears to be having significant clot burden so we will continue to monitor him another day with IV heparin.  Abnormal EKG: Prolonged PR interval on initial EKG but none seen on the repeat EKG. He is asymptomatic.   Colon mass.  Under investigation as outpatient.  Patient is scheduled to undergo colectomy after cardiac clearance.  Staging CT today showed possible pulmonary embolism.  Acute blood loss anemia: Hemoglobin dropped to 10.7.  Fairly stable.  DVT prophylaxis: Heparin   Code Status: Full Code  Family Communication: None at bedside today.  Plan of care discussed with patient.  Status is: Inpatient Remains inpatient appropriate because: Needs observation on IV heparin for another 24 hours at least.   Estimated body mass index is 28.19 kg/m as calculated from the following:   Height as of this encounter: 5\' 9"  (1.753 m).   Weight as of this encounter: 86.6 kg.    Nutritional Assessment: Body mass index is 28.19 kg/m.Marland Kitchen Seen by dietician.  I agree with the assessment and plan as outlined below: Nutrition Status:        . Skin Assessment: I have examined the patient's skin and I agree with the wound assessment as performed by the wound care RN as outlined below:    Consultants:  PCCM  Procedures:  None  Antimicrobials:  Anti-infectives (From admission, onward)    None  Subjective: Patient seen and examined.  Feels well and has no complaints today.  Objective: Vitals:   10/16/22 2049 10/17/22 0047 10/17/22 0502 10/17/22 0907  BP:  110/74 112/83 138/81  Pulse:    99  Resp: 18 17 17    Temp: 98.3 F (36.8 C)  99.4 F (37.4 C)   TempSrc: Oral  Oral   SpO2:    98%  Weight:      Height:        Intake/Output Summary (Last 24 hours) at 10/17/2022 1035 Last data filed at 10/17/2022 0859 Gross per 24 hour  Intake 595.12 ml   Output 600 ml  Net -4.88 ml   Filed Weights   10/14/22 2107 10/15/22 2150  Weight: 95.7 kg 86.6 kg    Examination:  General exam: Appears calm and comfortable  Respiratory system: Clear to auscultation. Respiratory effort normal. Cardiovascular system: S1 & S2 heard, RRR. No JVD, murmurs, rubs, gallops or clicks. No pedal edema. Gastrointestinal system: Abdomen is nondistended, soft and nontender. No organomegaly or masses felt. Normal bowel sounds heard. Central nervous system: Alert and oriented. No focal neurological deficits. Extremities: Symmetric 5 x 5 power. Skin: No rashes, lesions or ulcers.  Psychiatry: Judgement and insight appear normal. Mood & affect appropriate.   Data Reviewed: I have personally reviewed following labs and imaging studies  CBC: Recent Labs  Lab 10/14/22 2215 10/16/22 0315 10/16/22 1234 10/17/22 0953  WBC 6.2 6.3 6.7 6.1  NEUTROABS 3.6  --   --  3.8  HGB 13.1 11.0* 11.4* 10.7*  HCT 41.1 32.4* 35.8* 32.6*  MCV 87.8 82.9 86.9 85.6  PLT 177 89* 147* 142*   Basic Metabolic Panel: Recent Labs  Lab 10/14/22 2215 10/14/22 2325 10/15/22 0510 10/16/22 0315  NA 136  --   --  135  K 5.9*  --  3.6 4.4  CL 105  --   --  105  CO2 20*  --   --  17*  GLUCOSE 92  --   --  103*  BUN 17  --   --  13  CREATININE 1.49* 1.50*  --  1.46*  CALCIUM 9.0  --   --  8.6*  MG  --   --   --  1.8   GFR: Estimated Creatinine Clearance: 42.5 mL/min (A) (by C-G formula based on SCr of 1.46 mg/dL (H)). Liver Function Tests: Recent Labs  Lab 10/14/22 2215  AST 53*  ALT 24  ALKPHOS 100  BILITOT 1.6*  PROT 7.9  ALBUMIN 4.2   No results for input(s): "LIPASE", "AMYLASE" in the last 168 hours. No results for input(s): "AMMONIA" in the last 168 hours. Coagulation Profile: No results for input(s): "INR", "PROTIME" in the last 168 hours. Cardiac Enzymes: No results for input(s): "CKTOTAL", "CKMB", "CKMBINDEX", "TROPONINI" in the last 168 hours. BNP (last 3  results) No results for input(s): "PROBNP" in the last 8760 hours. HbA1C: No results for input(s): "HGBA1C" in the last 72 hours. CBG: Recent Labs  Lab 10/16/22 1638  GLUCAP 134*   Lipid Profile: No results for input(s): "CHOL", "HDL", "LDLCALC", "TRIG", "CHOLHDL", "LDLDIRECT" in the last 72 hours. Thyroid Function Tests: No results for input(s): "TSH", "T4TOTAL", "FREET4", "T3FREE", "THYROIDAB" in the last 72 hours. Anemia Panel: No results for input(s): "VITAMINB12", "FOLATE", "FERRITIN", "TIBC", "IRON", "RETICCTPCT" in the last 72 hours. Sepsis Labs: No results for input(s): "PROCALCITON", "LATICACIDVEN" in the last 168 hours.  No results found for this or any previous visit (from the  past 240 hour(s)).   Radiology Studies: No results found.  Scheduled Meds:  atorvastatin  20 mg Oral Daily   Continuous Infusions:  heparin 1,050 Units/hr (10/16/22 2116)     LOS: 2 days   Hughie Closs, MD [facility]  10/17/2022, 10:35 AM   *Please note that this is a verbal dictation therefore any spelling or grammatical errors are due to the "[employer] One" system interpretation.  Please page via Amion and do not message via secure chat for urgent patient care matters. Secure chat can be used for non urgent patient care matters.  How to contact the [facility] Attending or Consulting provider 7A - 7P or covering provider during after hours 7P -7A, for this patient?  Check the care team in [facility] and look for a) attending/consulting [facility] provider listed and b) the [facility] team listed. Page or secure chat 7A-7P. Log into [email or web address] and use [place]'s universal password to access. If you do not have the password, please contact the hospital operator. Locate the [facility] provider you are looking for under [facility] and page to a number that you can be directly reached. If you still have difficulty reaching the provider, please page the [facility] (Director on Call) for the Hospitalists  listed on amion for assistance.

## 2022-10-18 ENCOUNTER — Other Ambulatory Visit (HOSPITAL_COMMUNITY): Payer: Self-pay

## 2022-10-18 DIAGNOSIS — I2699 Other pulmonary embolism without acute cor pulmonale: Secondary | ICD-10-CM | POA: Diagnosis not present

## 2022-10-18 LAB — CBC
HCT: 33.3 % — ABNORMAL LOW (ref 39.0–52.0)
Hemoglobin: 10.9 g/dL — ABNORMAL LOW (ref 13.0–17.0)
MCH: 27.5 pg (ref 26.0–34.0)
MCHC: 32.7 g/dL (ref 30.0–36.0)
MCV: 84.1 fL (ref 80.0–100.0)
Platelets: 147 10*3/uL — ABNORMAL LOW (ref 150–400)
RBC: 3.96 MIL/uL — ABNORMAL LOW (ref 4.22–5.81)
RDW: 13.2 % (ref 11.5–15.5)
WBC: 5.9 10*3/uL (ref 4.0–10.5)
nRBC: 0 % (ref 0.0–0.2)

## 2022-10-18 LAB — HEPARIN LEVEL (UNFRACTIONATED): Heparin Unfractionated: 0.45 [IU]/mL (ref 0.30–0.70)

## 2022-10-18 MED ORDER — APIXABAN (ELIQUIS) VTE STARTER PACK (10MG AND 5MG)
ORAL_TABLET | ORAL | 0 refills | Status: DC
Start: 2022-10-18 — End: 2022-10-31
  Filled 2022-10-18: qty 74, 30d supply, fill #0

## 2022-10-18 MED ORDER — HYDROCHLOROTHIAZIDE 12.5 MG PO TABS
12.5000 mg | ORAL_TABLET | Freq: Every day | ORAL | Status: DC
  Administered 2022-10-18: 12.5 mg via ORAL
  Filled 2022-10-18: qty 1

## 2022-10-18 MED ORDER — LISINOPRIL 10 MG PO TABS
10.0000 mg | ORAL_TABLET | Freq: Every day | ORAL | Status: DC
  Administered 2022-10-18: 10 mg via ORAL
  Filled 2022-10-18: qty 1

## 2022-10-18 MED ORDER — LISINOPRIL-HYDROCHLOROTHIAZIDE 10-12.5 MG PO TABS: 1.0000 | ORAL_TABLET | Freq: Every day | ORAL | Status: DC

## 2022-10-18 MED ORDER — METOPROLOL TARTRATE 25 MG PO TABS
25.0000 mg | ORAL_TABLET | Freq: Two times a day (BID) | ORAL | Status: DC
  Administered 2022-10-18: 25 mg via ORAL
  Filled 2022-10-18: qty 1

## 2022-10-18 MED ORDER — APIXABAN 5 MG PO TABS
10.0000 mg | ORAL_TABLET | Freq: Two times a day (BID) | ORAL | Status: DC
  Administered 2022-10-18: 10 mg via ORAL
  Filled 2022-10-18: qty 2

## 2022-10-18 NOTE — Progress Notes (Signed)
Patient ambulated in hallway on telemetry x 264ft with no assistance; RN supervision only.  HR during ambulation 85-105.  No problems reported by patient; tolerated well.

## 2022-10-18 NOTE — Discharge Summary (Signed)
Physician Discharge Summary  Frederick Burnett WJX:[id number] DOB: [birth year] DOA: 10/14/2022  PCP: [facility]  Admit date: 10/14/2022 Discharge date: 10/18/2022 30 Day Unplanned Readmission Risk Score    Flowsheet Row ED to Hosp-Admission (Current) from 10/14/2022 in [facility]  30 Day Unplanned Readmission Risk Score (%) 11.21 Filed at 10/18/2022 1200       This score is the patient's risk of an unplanned readmission within 30 days of being discharged (0 -100%). The score is based on dignosis, age, lab data, medications, orders, and past utilization.   Low:  0-14.9   Medium: 15-21.9   High: 22-29.9   Extreme: 30 and above          Admitted From: Home Disposition: Home  Recommendations for Outpatient Follow-up:  Follow up with PCP in 1-2 weeks Please obtain BMP/CBC in one week Please follow up with your PCP on the following pending results: Unresulted Labs (From admission, onward)     Start     Ordered   10/19/22 0500  Heparin level (unfractionated)  Daily,   R      10/17/22 2049   10/18/22 0500  CBC  Daily,   R      10/16/22 2130              Home Health: None Equipment/Devices: None  Discharge Condition: Stable CODE STATUS: Full code Diet recommendation: Cardiac  Subjective: Seen and summoned.  Feeling well.  No complaints.  Excited to go home.  Brief/Interim Summary: Patient is a 82 years old male with past medical history of coronary artery disease status post stent, aortic valve replacement, history of pulmonary embolism., hyperlipidemia, colon mass under investigation was sent to ED after outpatient CT scan showing possible filling defect in the pulmonary artery.  Patient did have a history of pulmonary embolism in the past but was not on blood thinners.  Patient denied any chest pain, shortness of breath, nausea, vomiting, fever, shortness of breath or any leg pain or swelling.  He was asymptomatic on room air and not  tachycardic with normal pulse ox.  Initially PCCM was consulted but since he was hemodynamically stable medical team was consulted for admission to the hospital.    submassive bilateral PE.  CTA of the chest showed a large pulmonary embolism bilaterally with clot burden and right sided heart strain.  Possible RV clot.  Initial troponin normal.  BNP normal.  Transthoracic echo did not show any right heart strain or any clot in the right ventricle.  Patient asymptomatic and saturating well on room air.  He was also seen by PCCM, no thrombolytics were recommended.  Patient continues to feel well with no shortness of breath or chest pain.  He was tachycardic up until yesterday.  Today he walked without having any symptoms and his heart rate jumped to 105 at max.  He is much better today and he will be transitioned to Eliquis and then discharged on Eliquis starter pack.  Benefits and risk of anticoagulation has been discussed in length with patient and he has verbalized understanding.  Patient has been advised to take it easy for about a week as he is very active at baseline.  He has been advised to resume his activities gradually after a week.  He does not have any other restrictions otherwise.   Abnormal EKG: Prolonged PR interval on initial EKG but none seen on the repeat EKG. He is asymptomatic.   Colon mass.  Under investigation as outpatient.  Patient is scheduled to undergo colectomy after cardiac clearance.  Staging CT today showed possible pulmonary embolism.   Acute blood loss anemia: Hemoglobin dropped to 10.9.  Fairly stable.  Discharge plan was discussed with patient and/or family member/daughter over the phone and they verbalized understanding and agreed with it.  Discharge Diagnoses:  Active Problems:   Essential hypertension   S/P AVR   History of pulmonary embolism   History of DVT (deep vein thrombosis)   Acute pulmonary embolism (HCC)   Hyperlipidemia   Multiple pulmonary emboli  (HCC)    Discharge Instructions   Allergies as of 10/18/2022   No Known Allergies      Medication List     STOP taking these medications    amLODipine 5 MG tablet Commonly known as: NORVASC   aspirin EC 81 MG tablet       TAKE these medications    Apixaban Starter Pack (10mg  and 5mg ) Commonly known as: ELIQUIS STARTER PACK Take as directed on package: start with two-5mg  tablets twice daily for 7 days. On day 8, switch to one-5mg  tablet twice daily.   atorvastatin 20 MG tablet Commonly known as: LIPITOR Take 1 tablet (20 mg total) by mouth daily. Please schedule appointment for future refills. 3rd and final attempt. Thank you What changed:  when to take this additional instructions   Centrum Silver 50+Men Tabs Take 1 tablet by mouth daily.   lisinopril-hydrochlorothiazide 10-12.5 MG tablet Commonly known as: ZESTORETIC Take 1 tablet by mouth daily.   metoprolol tartrate 25 MG tablet Commonly known as: LOPRESSOR Take 1 tablet (25 mg total) by mouth 2 (two) times daily.   spironolactone 25 MG tablet Commonly known as: ALDACTONE Take 1 tablet (25 mg total) by mouth daily.        Follow-up Information     [facility] Follow up in 1 week(s).   Contact information: [address], [address] [place] [address] [phone]                No Known Allergies  Consultations: None   Procedures/Studies: ECHOCARDIOGRAM COMPLETE  Result Date: 10/15/2022    ECHOCARDIOGRAM REPORT   Patient Name:   Frederick Burnett Date of Exam: 10/15/2022 Medical Rec #:  [id number]      Height:       68.0 in Accession #:    [id number]     Weight:       211.0 lb Date of Birth:  31-Jul-1940      BSA:          2.091 m Patient Age:    82 years       BP:           123/84 mmHg Patient Gender: M              HR:           80 bpm. Exam Location:  Inpatient Procedure: 2D Echo, Color Doppler and Cardiac Doppler Indications:    I26.02 Pulmonary embolus   History:        Patient has prior history of Echocardiogram examinations, most                 recent 05/15/2022. Risk Factors:Hypertension.                 Aortic Valve: 23 mm Magna valve is present in the aortic  position. Procedure Date: 04/11/14.  Sonographer:    Irving Burton Senior RDCS Referring Phys: [phone] MATTHEW R HUNSUCKER IMPRESSIONS  1. Left ventricular ejection fraction, by estimation, is 60 to 65%. The left ventricle has normal function. The left ventricle has no regional wall motion abnormalities. There is severe left ventricular hypertrophy of the basal-septal segment. Left ventricular diastolic parameters are consistent with Grade I diastolic dysfunction (impaired relaxation).  2. Right ventricular systolic function is normal. The right ventricular size is mildly enlarged. There is normal pulmonary artery systolic pressure. The estimated right ventricular systolic pressure is 28.2 mmHg.  3. The mitral valve is normal in structure. Trivial mitral valve regurgitation. No evidence of mitral stenosis.  4. The aortic valve has been repaired/replaced Aortic valve regurgitation is not visualized. There is a 23 mm Magna valve present in the aortic position. Procedure Date: 04/11/14. Aortic valve area, by VTI measures 1.10 cm. Aortic valve mean gradient measures 11.0 mmHg. Aortic valve Vmax measures 2.08 m/s.  5. Aortic dilatation noted. There is moderate dilatation of the aortic root, measuring 48 mm.  6. The inferior vena cava is normal in size with greater than 50% respiratory variability, suggesting right atrial pressure of 3 mmHg.  7. Ascending aorta measurements are within normal limits for age when indexed to body surface area. FINDINGS  Left Ventricle: Left ventricular ejection fraction, by estimation, is 60 to 65%. The left ventricle has normal function. The left ventricle has no regional wall motion abnormalities. The left ventricular internal cavity size was normal in size. There is  severe  left ventricular hypertrophy of the basal-septal segment. Left ventricular diastolic parameters are consistent with Grade I diastolic dysfunction (impaired relaxation). Normal left ventricular filling pressure. Right Ventricle: The right ventricular size is mildly enlarged. No increase in right ventricular wall thickness. Right ventricular systolic function is normal. There is normal pulmonary artery systolic pressure. The tricuspid regurgitant velocity is 2.51  m/s, and with an assumed right atrial pressure of 3 mmHg, the estimated right ventricular systolic pressure is 28.2 mmHg. Left Atrium: Left atrial size was normal in size. Right Atrium: Right atrial size was normal in size. Pericardium: There is no evidence of pericardial effusion. Mitral Valve: The mitral valve is normal in structure. Trivial mitral valve regurgitation. No evidence of mitral valve stenosis. Tricuspid Valve: The tricuspid valve is normal in structure. Tricuspid valve regurgitation is mild . No evidence of tricuspid stenosis. Aortic Valve: The aortic valve has been repaired/replaced. Aortic valve regurgitation is not visualized. Aortic valve mean gradient measures 11.0 mmHg. Aortic valve peak gradient measures 17.3 mmHg. Aortic valve area, by VTI measures 1.10 cm. There is a  23 mm Magna valve present in the aortic position. Procedure Date: 04/11/14. Pulmonic Valve: The pulmonic valve was normal in structure. Pulmonic valve regurgitation is not visualized. No evidence of pulmonic stenosis. Aorta: Aortic dilatation noted. Ascending aorta measurements are within normal limits for age when indexed to body surface area. There is moderate dilatation of the aortic root, measuring 48 mm. Venous: The inferior vena cava is normal in size with greater than 50% respiratory variability, suggesting right atrial pressure of 3 mmHg. IAS/Shunts: No atrial level shunt detected by color flow Doppler.  LEFT VENTRICLE PLAX 2D LVIDd:         3.70 cm   Diastology  LVIDs:         2.70 cm   LV e' medial:    6.74 cm/s LV PW:         1.30  cm   LV E/e' medial:  7.2 LV IVS:        2.12 cm   LV e' lateral:   8.49 cm/s LVOT diam:     2.10 cm   LV E/e' lateral: 5.7 LV SV:         45 LV SV Index:   21 LVOT Area:     3.46 cm  RIGHT VENTRICLE RV S prime:     11.10 cm/s TAPSE (M-mode): 2.1 cm LEFT ATRIUM             Index        RIGHT ATRIUM           Index LA diam:        3.00 cm 1.43 cm/m   RA Area:     17.40 cm LA Vol (A2C):   53.7 ml 25.69 ml/m  RA Volume:   45.90 ml  21.95 ml/m LA Vol (A4C):   53.0 ml 25.35 ml/m LA Biplane Vol: 55.0 ml 26.31 ml/m  AORTIC VALVE AV Area (Vmax):    1.26 cm AV Area (Vmean):   1.17 cm AV Area (VTI):     1.10 cm AV Vmax:           208.00 cm/s AV Vmean:          163.000 cm/s AV VTI:            0.406 m AV Peak Grad:      17.3 mmHg AV Mean Grad:      11.0 mmHg LVOT Vmax:         75.50 cm/s LVOT Vmean:        54.850 cm/s LVOT VTI:          0.130 m LVOT/AV VTI ratio: 0.32  AORTA Ao Root diam: 3.20 cm Ao Asc diam:  4.30 cm MITRAL VALVE               TRICUSPID VALVE MV Area (PHT): 3.58 cm    TR Peak grad:   25.2 mmHg MV Decel Time: 212 msec    TR Vmax:        251.00 cm/s MV E velocity: 48.50 cm/s MV A velocity: 77.50 cm/s  SHUNTS MV E/A ratio:  0.63        Systemic VTI:  0.13 m                            Systemic Diam: 2.10 cm Armanda Magic MD Electronically signed by Armanda Magic MD Signature Date/Time: 10/15/2022/10:06:01 AM    Final    CT Angio Chest PE W and/or Wo Contrast  Result Date: 10/15/2022 CLINICAL DATA:  82 year old male with filling defect in a pulmonary artery branch on prior CT of the abdomen and pelvis. Follow-up study to assess for pulmonary embolism. EXAM: CT ANGIOGRAPHY CHEST WITH CONTRAST TECHNIQUE: Multidetector CT imaging of the chest was performed using the standard protocol during bolus administration of intravenous contrast. Multiplanar CT image reconstructions and MIPs were obtained to evaluate the vascular anatomy.  RADIATION DOSE REDUCTION: This exam was performed according to the departmental dose-optimization program which includes automated exposure control, adjustment of the mA and/or kV according to patient size and/or use of iterative reconstruction technique. CONTRAST:  75mL OMNIPAQUE IOHEXOL 350 MG/ML SOLN COMPARISON:  Chest CTA 04/24/2014. CT of the abdomen and pelvis 10/14/2022. FINDINGS: Cardiovascular: There are multiple filling defects within pulmonary artery branches in the lungs bilaterally indicative of  pulmonary embolism. There is extensive clot bilaterally, with the largest burden in the right lower lobe distribution involving the right lower lobe pulmonary artery and all segmental branches of the right lower lobe basal segments, many of which appear completely occlusive. Additional filling defects are noted on the left involving both left upper and lower lobe distributions, both occlusive and nonocclusive. There is also a potential filling defect in the right ventricle best appreciated on axial images 215 and 216 of series 7, concerning for thrombus. Estimated right ventricular diameter of 54 mm. Estimated left ventricular diameter of 28 mm. RV to LV ratio of 1.93. There is no significant pericardial fluid, thickening or pericardial calcification. There is aortic atherosclerosis, as well as atherosclerosis of the great vessels of the mediastinum and the coronary arteries, including calcified atherosclerotic plaque in the left main, left anterior descending, left circumflex and right coronary arteries. Status post median sternotomy for aortic valve replacement with what appears to be a stented bioprosthesis. Aneurysmal dilatation of the ascending aorta above the anastomosis of the graft estimated to measure 4.9 cm in diameter. Mediastinum/Nodes: No pathologically enlarged mediastinal or hilar lymph nodes. Small hiatal hernia. No axillary lymphadenopathy. Lungs/Pleura: No acute consolidative airspace disease.  No pleural effusions. Scattered small pulmonary nodules, largest of which is in the posterior aspect of the right lower lobe (axial image 95 of series 6) measuring 6 mm. Upper Abdomen: Aortic atherosclerosis. Musculoskeletal: Median sternotomy wires. There are no aggressive appearing lytic or blastic lesions noted in the visualized portions of the skeleton. Review of the MIP images confirms the above findings. IMPRESSION: 1. Study is positive for large burden of occlusive and nonocclusive pulmonary embolism in the lungs bilaterally, probable clot in the right ventricle and CT evidence of right heart strain (RV/LV Ratio = 1.93) consistent with at least submassive (intermediate risk) PE. The presence of right heart strain has been associated with an increased risk of morbidity and mortality. Please refer to the "Code PE Focused" order set in EPIC. 2. Small pulmonary nodules measuring 6 mm or less in size. Non-contrast chest CT at 6 months is recommended. If the nodules are stable at time of repeat CT, then future CT at 18-24 months (from today's scan) is considered optional for low-risk patients, but is recommended for high-risk patients. This recommendation follows the consensus statement: Guidelines for Management of Incidental Pulmonary Nodules Detected on CT Images: From the Fleischner Society 2017; Radiology 2017; 284:228-243. 3. Aortic atherosclerosis, in addition to left main and three-vessel coronary artery disease. Please note that although the presence of coronary artery calcium documents the presence of coronary artery disease, the severity of this disease and any potential stenosis cannot be assessed on this non-gated CT examination. Assessment for potential risk factor modification, dietary therapy or pharmacologic therapy may be warranted, if clinically indicated. 4. Aneurysmal dilatation of the ascending thoracic aorta (4.9 cm in diameter). Recommend semi-annual imaging followup by CTA or MRA and  referral to cardiothoracic surgery if not already obtained. This recommendation follows 2010 ACCF/AHA/AATS/ACR/ASA/SCA/SCAI/SIR/STS/SVM Guidelines for the Diagnosis and Management of Patients With Thoracic Aortic Disease. Circulation. 2010; 121: Z610-R604. Aortic aneurysm NOS (ICD10-I71.9) Critical Value/emergent results were called by telephone at the time of interpretation on 10/15/2022 at 5:37 am to provider [facility], who verbally acknowledged these results. Aortic Atherosclerosis (ICD10-I70.0). Electronically Signed   By: Trudie Reed M.D.   On: 10/15/2022 05:39   CT ABDOMEN PELVIS W CONTRAST  Result Date: 10/14/2022 CLINICAL DATA:  Descending colon mass.  * Tracking  Code: BO * EXAM: CT ABDOMEN AND PELVIS WITH CONTRAST TECHNIQUE: Multidetector CT imaging of the abdomen and pelvis was performed using the standard protocol following bolus administration of intravenous contrast. RADIATION DOSE REDUCTION: This exam was performed according to the departmental dose-optimization program which includes automated exposure control, adjustment of the mA and/or kV according to patient size and/or use of iterative reconstruction technique. CONTRAST:  75mL OMNIPAQUE IOHEXOL 350 MG/ML SOLN COMPARISON:  CTA chest dated 04/16/2014, 01/05/2013 FINDINGS: Lower chest: Apparent central filling defect within right lower lobe posterior segmental pulmonary artery (3:3). No pleural effusion or pneumothorax demonstrated. Partially imaged heart size is normal. Coronary artery calcification. Hepatobiliary: No focal hepatic lesions. No intra or extrahepatic biliary ductal dilation. Cholelithiasis. Pancreas: No focal lesions or main ductal dilation. Spleen: Normal in size without focal abnormality. Adrenals/Urinary Tract: 1.7 cm right adrenal nodule (3:26) measures 105 HU and is unchanged from 01/05/2013, likely adenoma. No specific follow-up imaging recommended. No left adrenal nodule. No suspicious renal mass, calculi or  hydronephrosis. No focal bladder wall thickening. Stomach/Bowel: Normal appearance of the stomach. Mildly featureless appearance of the splenic flexure with suspected mural thickening (3:21) may correspond to known descending colon mass. Normal appendix. Vascular/Lymphatic: Aortic atherosclerosis. No enlarged abdominal or pelvic lymph nodes. Reproductive: Enlargement of the prostate with median lobe hypertrophy. Other: No free fluid, fluid collection, or free air. Musculoskeletal: No acute or abnormal lytic or blastic osseous lesions. Multilevel degenerative changes of the partially imaged thoracic and lumbar spine. Grade 1 anterolisthesis at L3-4 and grade 1 retrolisthesis at L5-S1. Small fat-containing paraumbilical and bilateral inguinal hernias code:Marland Kitchen IMPRESSION: 1. Apparent central filling defect within right lower lobe posterior segmental pulmonary artery, which may represent a pulmonary embolism. Recommend correlation with PE protocol CTA chest. 2. Mildly featureless appearance of the splenic flexure with suspected mural thickening may correspond to known descending colon mass. 3. No evidence of metastatic disease in the abdomen or pelvis. 4. Cholelithiasis. 5. Enlargement of the prostate with median lobe hypertrophy. 6.  Aortic Atherosclerosis (ICD10-I70.0). Critical Value/emergent results were called by telephone at the time of interpretation on 10/14/2022 at 7:04 pm to provider Abigail Miyamoto, who verbally acknowledged these results. Electronically Signed   By: Agustin Cree M.D.   On: 10/14/2022 19:12     Discharge Exam: Vitals:   10/17/22 2024 10/18/22 0356  BP: 112/82 114/80  Pulse: 92 92  Resp: 18 18  Temp: 98.3 F (36.8 C) 97.9 F (36.6 C)  SpO2: 100% 96%   Vitals:   10/17/22 1202 10/17/22 1706 10/17/22 2024 10/18/22 0356  BP: 127/82 117/86 112/82 114/80  Pulse: 89 86 92 92  Resp: 18 19 18 18   Temp: 98.6 F (37 C) 98.3 F (36.8 C) 98.3 F (36.8 C) 97.9 F (36.6 C)  TempSrc: Oral  Oral Oral Oral  SpO2: 99% 97% 100% 96%  Weight:      Height:        General: Pt is alert, awake, not in acute distress Cardiovascular: RRR, S1/S2 +, no rubs, no gallops Respiratory: CTA bilaterally, no wheezing, no rhonchi Abdominal: Soft, NT, ND, bowel sounds + Extremities: no edema, no cyanosis    The results of significant diagnostics from this hospitalization (including imaging, microbiology, ancillary and laboratory) are listed below for reference.     Microbiology: No results found for this or any previous visit (from the past 240 hour(s)).   Labs: BNP (last 3 results) Recent Labs    10/15/22 0510  BNP 42.5   Basic  Metabolic Panel: Recent Labs  Lab 10/14/22 2215 10/14/22 2325 10/15/22 0510 10/16/22 0315 10/17/22 0953  NA 136  --   --  135 135  K 5.9*  --  3.6 4.4 3.8  CL 105  --   --  105 105  CO2 20*  --   --  17* 20*  GLUCOSE 92  --   --  103* 98  BUN 17  --   --  13 10  CREATININE 1.49* 1.50*  --  1.46* 1.25*  CALCIUM 9.0  --   --  8.6* 8.8*  MG  --   --   --  1.8  --    Liver Function Tests: Recent Labs  Lab 10/14/22 2215  AST 53*  ALT 24  ALKPHOS 100  BILITOT 1.6*  PROT 7.9  ALBUMIN 4.2   No results for input(s): "LIPASE", "AMYLASE" in the last 168 hours. No results for input(s): "AMMONIA" in the last 168 hours. CBC: Recent Labs  Lab 10/14/22 2215 10/16/22 0315 10/16/22 1234 10/17/22 0953 10/18/22 0532  WBC 6.2 6.3 6.7 6.1 5.9  NEUTROABS 3.6  --   --  3.8  --   HGB 13.1 11.0* 11.4* 10.7* 10.9*  HCT 41.1 32.4* 35.8* 32.6* 33.3*  MCV 87.8 82.9 86.9 85.6 84.1  PLT 177 89* 147* 142* 147*   Cardiac Enzymes: No results for input(s): "CKTOTAL", "CKMB", "CKMBINDEX", "TROPONINI" in the last 168 hours. BNP: Invalid input(s): "POCBNP" CBG: Recent Labs  Lab 10/16/22 1638  GLUCAP 134*   D-Dimer No results for input(s): "DDIMER" in the last 72 hours. Hgb A1c No results for input(s): "HGBA1C" in the last 72 hours. Lipid Profile No  results for input(s): "CHOL", "HDL", "LDLCALC", "TRIG", "CHOLHDL", "LDLDIRECT" in the last 72 hours. Thyroid function studies No results for input(s): "TSH", "T4TOTAL", "T3FREE", "THYROIDAB" in the last 72 hours.  Invalid input(s): "FREET3" Anemia work up No results for input(s): "VITAMINB12", "FOLATE", "FERRITIN", "TIBC", "IRON", "RETICCTPCT" in the last 72 hours. Urinalysis    Component Value Date/Time   COLORURINE YELLOW 02/26/2017 1830   APPEARANCEUR CLEAR 02/26/2017 1830   LABSPEC 1.021 02/26/2017 1830   PHURINE 6.0 02/26/2017 1830   GLUCOSEU NEGATIVE 02/26/2017 1830   HGBUR NEGATIVE 02/26/2017 1830   BILIRUBINUR NEGATIVE 02/26/2017 1830   KETONESUR NEGATIVE 02/26/2017 1830   PROTEINUR NEGATIVE 02/26/2017 1830   UROBILINOGEN 1.0 04/07/2014 1057   NITRITE NEGATIVE 02/26/2017 1830   LEUKOCYTESUR NEGATIVE 02/26/2017 1830   Sepsis Labs Recent Labs  Lab 10/16/22 0315 10/16/22 1234 10/17/22 0953 10/18/22 0532  WBC 6.3 6.7 6.1 5.9   Microbiology No results found for this or any previous visit (from the past 240 hour(s)).  FURTHER DISCHARGE INSTRUCTIONS:   Get Medicines reviewed and adjusted: Please take all your medications with you for your next visit with your Primary MD   Laboratory/radiological data: Please request your Primary MD to go over all hospital tests and procedure/radiological results at the follow up, please ask your Primary MD to get all Hospital records sent to his/her office.   In some cases, they will be blood work, cultures and biopsy results pending at the time of your discharge. Please request that your primary care M.D. goes through all the records of your hospital data and follows up on these results.   Also Note the following: If you experience worsening of your admission symptoms, develop shortness of breath, life threatening emergency, suicidal or homicidal thoughts you must seek medical attention immediately by calling 911 or  calling your MD  immediately  if symptoms less severe.   You must read complete instructions/literature along with all the possible adverse reactions/side effects for all the Medicines you take and that have been prescribed to you. Take any new Medicines after you have completely understood and accpet all the possible adverse reactions/side effects.    Do not drive when taking Pain medications or sleeping medications (Benzodaizepines)   Do not take more than prescribed Pain, Sleep and Anxiety Medications. It is not advisable to combine anxiety,sleep and pain medications without talking with your primary care practitioner   Special Instructions: If you have smoked or chewed Tobacco  in the last 2 yrs please stop smoking, stop any regular Alcohol  and or any Recreational drug use.   Wear Seat belts while driving.   Please note: You were cared for by a hospitalist during your hospital stay. Once you are discharged, your primary care physician will handle any further medical issues. Please note that NO REFILLS for any discharge medications will be authorized once you are discharged, as it is imperative that you return to your primary care physician (or establish a relationship with a primary care physician if you do not have one) for your post hospital discharge needs so that they can reassess your need for medications and monitor your lab values  Time coordinating discharge: Over 30 minutes  SIGNED:   Hughie Closs, MD  [facility] 10/18/2022, 12:57 PM *Please note that this is a verbal dictation therefore any spelling or grammatical errors are due to the "[employer]agon [employer] One" system interpretation. If 7PM-7AM, please contact night-coverage [email or web address]

## 2022-10-18 NOTE — Care Management Important Message (Signed)
Important Message  Patient Details  Name: Frederick Burnett MRN: [id number] Date of Birth: 01/30/1941   Medicare Important Message Given:  Yes     [name] 10/18/2022, 9:06 AM

## 2022-10-18 NOTE — Discharge Instructions (Signed)
Information on my medicine - ELIQUIS (apixaban)   Why was Eliquis prescribed for you? Eliquis was prescribed to treat blood clots that may have been found in the veins of your legs (deep vein thrombosis) or in your lungs (pulmonary embolism) and to reduce the risk of them occurring again.  What do You need to know about Eliquis ? The starting dose is 10 mg (two 5 mg tablets) taken TWICE daily for the FIRST SEVEN (7) DAYS, then on 8/30 the dose is reduced to ONE 5 mg tablet taken TWICE daily.  Eliquis may be taken with or without food.   Try to take the dose about the same time in the morning and in the evening. If you have difficulty swallowing the tablet whole please discuss with your pharmacist how to take the medication safely.  Take Eliquis exactly as prescribed and DO NOT stop taking Eliquis without talking to the doctor who prescribed the medication.  Stopping may increase your risk of developing a new blood clot.  Refill your prescription before you run out.  After discharge, you should have regular check-up appointments with your healthcare provider that is prescribing your Eliquis.    What do you do if you miss a dose? If a dose of ELIQUIS is not taken at the scheduled time, take it as soon as possible on the same day and twice-daily administration should be resumed. The dose should not be doubled to make up for a missed dose.  Important Safety Information A possible side effect of Eliquis is bleeding. You should call your healthcare provider right away if you experience any of the following: Bleeding from an injury or your nose that does not stop. Unusual colored urine (red or dark brown) or unusual colored stools (red or black). Unusual bruising for unknown reasons. A serious fall or if you hit your head (even if there is no bleeding).  Some medicines may interact with Eliquis and might increase your risk of bleeding or clotting while on Eliquis. To help avoid this,  consult your healthcare provider or pharmacist prior to using any new prescription or non-prescription medications, including herbals, vitamins, non-steroidal anti-inflammatory drugs (NSAIDs) and supplements.  This website has more information on Eliquis (apixaban): http://www.eliquis.com/eliquis/home

## 2022-10-18 NOTE — TOC Transition Note (Signed)
Transition of Care [facility]) - CM/SW Discharge Note   Patient Details  Name: Frederick Burnett MRN: [id number] Date of Birth: Aug 31, 1940  [facility] CM/SW Contact:  Leone Haven, RN Phone Number: 10/18/2022, 1:09 PM   Clinical Narrative:    Patient is for dc today, copay for eliqiuis is 47.00 per benefit check.      Barriers to Discharge: Continued Medical Work up   Patient Goals and CMS Choice   Choice offered to / list presented to : NA  Discharge Placement                         Discharge Plan and Services Additional resources added to the After Visit Summary for   In-house Referral: NA Discharge Planning Services: CM Consult Post Acute Care Choice: NA          DME Arranged: N/A         HH Arranged: NA          Social Determinants of Health (SDOH) Interventions SDOH Screenings   Food Insecurity: No Food Insecurity (10/15/2022)  Housing: Low Risk  (10/15/2022)  Transportation Needs: No Transportation Needs (10/15/2022)  Utilities: Not At Risk (10/15/2022)  Tobacco Use: Medium Risk (10/14/2022)     Readmission Risk Interventions     No data to display

## 2022-10-18 NOTE — Plan of Care (Signed)
  Problem: Activity: Goal: Ability to tolerate increased activity will improve Outcome: Progressing   Problem: Respiratory: Goal: Respiratory status will improve Outcome: Progressing   Problem: Education: Goal: Knowledge of General Education information will improve Description: Including pain rating scale, medication(s)/side effects and non-pharmacologic comfort measures Outcome: Progressing   Problem: Health Behavior/Discharge Planning: Goal: Ability to manage health-related needs will improve Outcome: Progressing   Problem: Clinical Measurements: Goal: Ability to maintain clinical measurements within normal limits will improve Outcome: Progressing Goal: Will remain free from infection Outcome: Progressing   Problem: Coping: Goal: Level of anxiety will decrease Outcome: Progressing   Problem: Safety: Goal: Ability to remain free from injury will improve Outcome: Progressing   Problem: Skin Integrity: Goal: Risk for impaired skin integrity will decrease Outcome: Progressing

## 2022-10-18 NOTE — Progress Notes (Signed)
ANTICOAGULATION CONSULT NOTE - Follow-up Note  Pharmacy Consult for Heparin Indication: pulmonary embolus  No Known Allergies  Patient Measurements: Height: 5\' 9"  (175.3 cm) Weight: 86.6 kg (190 lb 14.4 oz) IBW/kg (Calculated) : 70.7 Heparin Dosing Weight: 88.6 kg  Vital Signs: Temp: 97.9 F (36.6 C) (08/23 0356) Temp Source: Oral (08/23 0356) BP: 114/80 (08/23 0356) Pulse Rate: 92 (08/23 0356)  Labs: Recent Labs    10/16/22 0315 10/16/22 1234 10/16/22 1903 10/17/22 0953 10/17/22 1916 10/18/22 0532  HGB 11.0* 11.4*  --  10.7*  --  10.9*  HCT 32.4* 35.8*  --  32.6*  --  33.3*  PLT 89* 147*  --  142*  --  147*  HEPARINUNFRC 0.84* 0.52   < > 0.23* 0.30 0.45  CREATININE 1.46*  --   --  1.25*  --   --    < > = values in this interval not displayed.    Estimated Creatinine Clearance: 49.7 mL/min (A) (by C-G formula based on SCr of 1.25 mg/dL (H)).   Medical History: Past Medical History:  Diagnosis Date   Aortic regurgitation    Aortic regurgitation    Aortic root enlargement (HCC)    Arthritis    both shoulders - RC- wear   Heart murmur    History of pulmonary embolism 04/24/2014   Provoked; occurred after AVR in 2016   HTN (hypertension)    Hx of echocardiogram    Echo 3/16: mild LVH, EF 40-45%, ant-septal AK, AVR with elevated velocity (3.3 m/s - slightly above expected value), mild LAE //  Echo 3/17: Mild LVH, EF 40%, diffuse HK, indeterminate diastolic function, bioprosthetic AVR well-functioning (mean gradient 9 mmHg), MAC, mild LAE, mildly reduced RVSF, mild RAE   Hypercholesteremia     Medications:  Medications Prior to Admission  Medication Sig Dispense Refill Last Dose   amLODipine (NORVASC) 5 MG tablet Take 1 tablet (5 mg total) by mouth daily. Call and schedule follow up appt to receive further refills. [phone] 3rd attempt 15 tablet 0 10/14/2022   aspirin EC 81 MG tablet Take 1 tablet (81 mg total) by mouth daily. (Patient taking differently: Take  81 mg by mouth at bedtime.) 90 tablet 3 Past Week   atorvastatin (LIPITOR) 20 MG tablet Take 1 tablet (20 mg total) by mouth daily. Please schedule appointment for future refills. 3rd and final attempt. Thank you (Patient taking differently: Take 20 mg by mouth at bedtime.) 15 tablet 0 Past Week   lisinopril-hydrochlorothiazide (ZESTORETIC) 10-12.5 MG tablet Take 1 tablet by mouth daily.   10/14/2022   metoprolol tartrate (LOPRESSOR) 25 MG tablet Take 1 tablet (25 mg total) by mouth 2 (two) times daily. 60 tablet 3 10/14/2022   Multiple Vitamins-Minerals (CENTRUM SILVER 50+MEN) TABS Take 1 tablet by mouth daily.   10/14/2022   spironolactone (ALDACTONE) 25 MG tablet Take 1 tablet (25 mg total) by mouth daily. 90 tablet 2 10/14/2022   Scheduled:   atorvastatin  20 mg Oral Daily   hydrochlorothiazide  12.5 mg Oral Daily   lisinopril  10 mg Oral Daily   metoprolol tartrate  25 mg Oral BID   Infusions:   heparin 1,350 Units/hr (10/17/22 2058)   PRN:   Assessment: 82 yom with a history of CAD, AVR, PE, HLD, colon mass. Patient is presenting with possible PE. Patient has history of PE (2016) with no anticoagulation. CT on 8/19 shows a PE. Heparin per pharmacy consult placed for pulmonary embolus.  -heparin level= 0.45  on 1350 units/hr  Goal of Therapy:  Heparin level 0.3-0.7 units/ml Monitor platelets by anticoagulation protocol: Yes   Plan:  -Continue heparin 1350 units/hr -Daily heparin level and CBC -Will follow oral anticoagulation plans   Harland German, PharmD Clinical Pharmacist **Pharmacist phone directory can now be found on [email or web address] ([email or web address]).  Listed under [facility].

## 2022-10-23 ENCOUNTER — Encounter: Payer: Self-pay | Admitting: Gastroenterology

## 2022-10-24 DIAGNOSIS — I10 Essential (primary) hypertension: Secondary | ICD-10-CM | POA: Diagnosis not present

## 2022-10-24 DIAGNOSIS — I2699 Other pulmonary embolism without acute cor pulmonale: Secondary | ICD-10-CM | POA: Diagnosis not present

## 2022-10-24 DIAGNOSIS — I2510 Atherosclerotic heart disease of native coronary artery without angina pectoris: Secondary | ICD-10-CM | POA: Diagnosis not present

## 2022-10-24 DIAGNOSIS — R933 Abnormal findings on diagnostic imaging of other parts of digestive tract: Secondary | ICD-10-CM | POA: Diagnosis not present

## 2022-10-24 DIAGNOSIS — I7121 Aneurysm of the ascending aorta, without rupture: Secondary | ICD-10-CM | POA: Diagnosis not present

## 2022-10-24 DIAGNOSIS — E7800 Pure hypercholesterolemia, unspecified: Secondary | ICD-10-CM | POA: Diagnosis not present

## 2022-10-24 DIAGNOSIS — I428 Other cardiomyopathies: Secondary | ICD-10-CM | POA: Diagnosis not present

## 2022-10-24 DIAGNOSIS — Z86010 Personal history of colonic polyps: Secondary | ICD-10-CM | POA: Diagnosis not present

## 2022-10-24 DIAGNOSIS — R911 Solitary pulmonary nodule: Secondary | ICD-10-CM | POA: Diagnosis not present

## 2022-10-29 ENCOUNTER — Inpatient Hospital Stay: Payer: Medicare HMO | Attending: Nurse Practitioner | Admitting: Nurse Practitioner

## 2022-10-29 ENCOUNTER — Inpatient Hospital Stay: Payer: Medicare HMO

## 2022-10-29 ENCOUNTER — Encounter: Payer: Self-pay | Admitting: Nurse Practitioner

## 2022-10-29 VITALS — BP 116/87 | HR 64 | Temp 97.9°F | Resp 18 | Ht 69.0 in | Wt 189.1 lb

## 2022-10-29 DIAGNOSIS — Z953 Presence of xenogenic heart valve: Secondary | ICD-10-CM | POA: Insufficient documentation

## 2022-10-29 DIAGNOSIS — Z86718 Personal history of other venous thrombosis and embolism: Secondary | ICD-10-CM | POA: Insufficient documentation

## 2022-10-29 DIAGNOSIS — I509 Heart failure, unspecified: Secondary | ICD-10-CM | POA: Diagnosis not present

## 2022-10-29 DIAGNOSIS — Z79899 Other long term (current) drug therapy: Secondary | ICD-10-CM | POA: Diagnosis not present

## 2022-10-29 DIAGNOSIS — K4020 Bilateral inguinal hernia, without obstruction or gangrene, not specified as recurrent: Secondary | ICD-10-CM | POA: Insufficient documentation

## 2022-10-29 DIAGNOSIS — I2510 Atherosclerotic heart disease of native coronary artery without angina pectoris: Secondary | ICD-10-CM | POA: Insufficient documentation

## 2022-10-29 DIAGNOSIS — I110 Hypertensive heart disease with heart failure: Secondary | ICD-10-CM | POA: Diagnosis not present

## 2022-10-29 DIAGNOSIS — Z86711 Personal history of pulmonary embolism: Secondary | ICD-10-CM | POA: Insufficient documentation

## 2022-10-29 DIAGNOSIS — Z8249 Family history of ischemic heart disease and other diseases of the circulatory system: Secondary | ICD-10-CM | POA: Insufficient documentation

## 2022-10-29 DIAGNOSIS — K8020 Calculus of gallbladder without cholecystitis without obstruction: Secondary | ICD-10-CM | POA: Diagnosis not present

## 2022-10-29 DIAGNOSIS — I700 Atherosclerosis of aorta: Secondary | ICD-10-CM | POA: Insufficient documentation

## 2022-10-29 DIAGNOSIS — D509 Iron deficiency anemia, unspecified: Secondary | ICD-10-CM | POA: Diagnosis not present

## 2022-10-29 DIAGNOSIS — I2699 Other pulmonary embolism without acute cor pulmonale: Secondary | ICD-10-CM | POA: Insufficient documentation

## 2022-10-29 DIAGNOSIS — K449 Diaphragmatic hernia without obstruction or gangrene: Secondary | ICD-10-CM | POA: Insufficient documentation

## 2022-10-29 DIAGNOSIS — R970 Elevated carcinoembryonic antigen [CEA]: Secondary | ICD-10-CM | POA: Insufficient documentation

## 2022-10-29 DIAGNOSIS — R634 Abnormal weight loss: Secondary | ICD-10-CM | POA: Diagnosis not present

## 2022-10-29 DIAGNOSIS — M4316 Spondylolisthesis, lumbar region: Secondary | ICD-10-CM | POA: Diagnosis not present

## 2022-10-29 DIAGNOSIS — K6389 Other specified diseases of intestine: Secondary | ICD-10-CM | POA: Insufficient documentation

## 2022-10-29 DIAGNOSIS — M4317 Spondylolisthesis, lumbosacral region: Secondary | ICD-10-CM | POA: Diagnosis not present

## 2022-10-29 DIAGNOSIS — I7121 Aneurysm of the ascending aorta, without rupture: Secondary | ICD-10-CM | POA: Diagnosis not present

## 2022-10-29 DIAGNOSIS — M47814 Spondylosis without myelopathy or radiculopathy, thoracic region: Secondary | ICD-10-CM | POA: Insufficient documentation

## 2022-10-29 DIAGNOSIS — M47816 Spondylosis without myelopathy or radiculopathy, lumbar region: Secondary | ICD-10-CM | POA: Insufficient documentation

## 2022-10-29 DIAGNOSIS — Z87891 Personal history of nicotine dependence: Secondary | ICD-10-CM | POA: Insufficient documentation

## 2022-10-29 DIAGNOSIS — E7800 Pure hypercholesterolemia, unspecified: Secondary | ICD-10-CM | POA: Diagnosis not present

## 2022-10-29 DIAGNOSIS — N400 Enlarged prostate without lower urinary tract symptoms: Secondary | ICD-10-CM | POA: Diagnosis not present

## 2022-10-29 DIAGNOSIS — Z7901 Long term (current) use of anticoagulants: Secondary | ICD-10-CM | POA: Diagnosis not present

## 2022-10-29 DIAGNOSIS — R195 Other fecal abnormalities: Secondary | ICD-10-CM | POA: Insufficient documentation

## 2022-10-29 LAB — CBC WITH DIFFERENTIAL (CANCER CENTER ONLY)
Abs Immature Granulocytes: 0.01 10*3/uL (ref 0.00–0.07)
Basophils Absolute: 0.1 10*3/uL (ref 0.0–0.1)
Basophils Relative: 1 %
Eosinophils Absolute: 0.2 10*3/uL (ref 0.0–0.5)
Eosinophils Relative: 4 %
HCT: 35.4 % — ABNORMAL LOW (ref 39.0–52.0)
Hemoglobin: 11.7 g/dL — ABNORMAL LOW (ref 13.0–17.0)
Immature Granulocytes: 0 %
Lymphocytes Relative: 33 %
Lymphs Abs: 1.6 10*3/uL (ref 0.7–4.0)
MCH: 28.7 pg (ref 26.0–34.0)
MCHC: 33.1 g/dL (ref 30.0–36.0)
MCV: 87 fL (ref 80.0–100.0)
Monocytes Absolute: 0.6 10*3/uL (ref 0.1–1.0)
Monocytes Relative: 11 %
Neutro Abs: 2.5 10*3/uL (ref 1.7–7.7)
Neutrophils Relative %: 51 %
Platelet Count: 211 10*3/uL (ref 150–400)
RBC: 4.07 MIL/uL — ABNORMAL LOW (ref 4.22–5.81)
RDW: 13 % (ref 11.5–15.5)
WBC Count: 5 10*3/uL (ref 4.0–10.5)
nRBC: 0 % (ref 0.0–0.2)

## 2022-10-29 LAB — CMP (CANCER CENTER ONLY)
ALT: 14 U/L (ref 0–44)
AST: 16 U/L (ref 15–41)
Albumin: 4.1 g/dL (ref 3.5–5.0)
Alkaline Phosphatase: 95 U/L (ref 38–126)
Anion gap: 6 (ref 5–15)
BUN: 17 mg/dL (ref 8–23)
CO2: 26 mmol/L (ref 22–32)
Calcium: 9.5 mg/dL (ref 8.9–10.3)
Chloride: 107 mmol/L (ref 98–111)
Creatinine: 1.41 mg/dL — ABNORMAL HIGH (ref 0.61–1.24)
GFR, Estimated: 50 mL/min — ABNORMAL LOW (ref >60)
Glucose, Bld: 95 mg/dL (ref 70–99)
Potassium: 4.8 mmol/L (ref 3.5–5.1)
Sodium: 139 mmol/L (ref 135–145)
Total Bilirubin: 0.4 mg/dL (ref 0.3–1.2)
Total Protein: 7.7 g/dL (ref 6.5–8.1)

## 2022-10-29 LAB — IRON AND IRON BINDING CAPACITY (CC-WL,HP ONLY)
Iron: 108 ug/dL (ref 45–182)
Saturation Ratios: 32 % (ref 17.9–39.5)
TIBC: 333 ug/dL (ref 250–450)
UIBC: 225 ug/dL (ref 117–376)

## 2022-10-29 LAB — FERRITIN: Ferritin: 65 ng/mL (ref 24–336)

## 2022-10-29 LAB — CEA (ACCESS): CEA (CHCC): 6.34 ng/mL — ABNORMAL HIGH (ref 0.00–5.00)

## 2022-10-29 MED ORDER — APIXABAN 5 MG PO TABS
5.0000 mg | ORAL_TABLET | Freq: Two times a day (BID) | ORAL | 6 refills | Status: DC
Start: 2022-10-29 — End: 2023-06-30

## 2022-10-29 NOTE — Progress Notes (Addendum)
[facility]   [phone] Fax:(336) [phone]   Clinic New Consult Note   Patient Care Team: Irven Coe, MD as PCP - General (Family Medicine) Lyn Records, MD (Inactive) as PCP - Cardiology (Cardiology) 10/29/2022  CHIEF COMPLAINTS/PURPOSE OF CONSULTATION:  Recurrent PE and suspicion for colon cancer, referred by hospitalist   HISTORY OF PRESENTING ILLNESS:  Frederick Burnett 82 y.o. male with PMH including HTN, CAD, CHF and DVT/PE after AVR in 03/2014 is here because of suspicion for colon cancer.  He was evaluated by PCP and GI for anemia and heme positive stool, colonoscopy 09/03/2022 by Dr. Bosie Clos showed a fungating partially obstructing large mass in the descending colon which was partially circumferential.  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  He was seen by colorectal surgeon Dr. Maisie Fus 8/12 who referred him for staging CT abdomen/pelvis 10/14/2022 which showed incidental central filling defect within the right lower lobe segmental pulmonary artery no evidence of metastatic disease in the abdomen or pelvis.  CTA 10/15/2022 showed large burden of occlusive and nonocclusive pulmonary emboli in the lungs bilaterally, possible clot in the right ventricle and CT evidence of right heart strain consistent with at least submassive PE, as well as small pulmonary nodules measuring up to 6 mm and should be monitored.  He previously had left leg DVT and bilateral PE on CTA from 04/24/2014 after aortic valve repair. Hypercoagulable studies at the time were remarkable for +lupus anticoagulant, elevated protein S (>150) and protein C (157) activity. He does not recall being anticoagulated and was not on any at the time of most recent PE. He was asymptomatic. Denies recent infection, trauma, surgery, immobility, travel, or other provoking factors except. This was likely secondary to finding in the colon.   Socially, lives with family, retired  school bus driver and avid gardener. He is busy/active. Denies smoking, alcohol, or other drug use. No known family history of DVT/PE or malignancy.   He presents with his daughter, feeling well in general. Denies change in bowel habits, hematochezia/melena, abdominal pain/bloating, or decreased appetite. Denies unintentional weight loss but record shows 22 lbs weight loss in past 6 months. Denies leg edema, calf pain, cough, chest pain, dyspnea.   MEDICAL HISTORY:  Past Medical History:  Diagnosis Date   Aortic regurgitation    Aortic regurgitation    Aortic root enlargement (HCC)    Arthritis    both shoulders - RC- wear   Heart murmur    History of pulmonary embolism 04/24/2014   Provoked; occurred after AVR in 2016   HTN (hypertension)    Hx of echocardiogram    Echo 3/16: mild LVH, EF 40-45%, ant-septal AK, AVR with elevated velocity (3.3 m/s - slightly above expected value), mild LAE //  Echo 3/17: Mild LVH, EF 40%, diffuse HK, indeterminate diastolic function, bioprosthetic AVR well-functioning (mean gradient 9 mmHg), MAC, mild LAE, mildly reduced RVSF, mild RAE   Hypercholesteremia     SURGICAL HISTORY: Past Surgical History:  Procedure Laterality Date   AORTIC VALVE REPLACEMENT N/A 04/11/2014   Procedure: AORTIC VALVE REPLACEMENT (AVR);  Surgeon: Alleen Borne, MD;  Location: [facility] OR;  Service: Open Heart Surgery;  Laterality: N/A;   CARDIAC CATHETERIZATION     LEFT AND RIGHT HEART CATHETERIZATION WITH CORONARY ANGIOGRAM N/A 02/22/2014   Procedure: LEFT AND RIGHT HEART CATHETERIZATION WITH CORONARY ANGIOGRAM;  Surgeon: Lesleigh Noe, MD;  Location: [facility] CATH LAB;  Service: Cardiovascular;  Laterality: N/A;  MULTIPLE TOOTH EXTRACTIONS     TEE WITHOUT CARDIOVERSION N/A 03/24/2014   Procedure: TRANSESOPHAGEAL ECHOCARDIOGRAM (TEE);  Surgeon: Lars Masson, MD;  Location: [facility] ENDOSCOPY;  Service: Cardiovascular;  Laterality: N/A;   TEE WITHOUT CARDIOVERSION N/A 04/11/2014    Procedure: TRANSESOPHAGEAL ECHOCARDIOGRAM (TEE);  Surgeon: Alleen Borne, MD;  Location: [facility] OR;  Service: Open Heart Surgery;  Laterality: N/A;    SOCIAL HISTORY: Social History   Socioeconomic History   Marital status: Widowed    Spouse name: Not on file   Number of children: Not on file   Years of education: Not on file   Highest education level: Not on file  Occupational History   Not on file  Tobacco Use   Smoking status: Former    Types: Cigars    Quit date: 05/29/2001    Years since quitting: 21.4   Smokeless tobacco: Never  Substance and Sexual Activity   Alcohol use: No    Alcohol/week: 0.0 standard drinks of alcohol   Drug use: No   Sexual activity: Not on file  Other Topics Concern   Not on file  Social History Narrative   Not on file   Social Determinants of Health   Financial Resource Strain: Not on file  Food Insecurity: No Food Insecurity (10/15/2022)   Hunger Vital Sign    Worried About Running Out of Food in the Last Year: Never true    Ran Out of Food in the Last Year: Never true  Transportation Needs: No Transportation Needs (10/15/2022)   PRAPARE - Administrator, Civil Service (Medical): No    Lack of Transportation (Non-Medical): No  Physical Activity: Not on file  Stress: Not on file  Social Connections: Not on file  Intimate Partner Violence: Not At Risk (10/15/2022)   Humiliation, Afraid, Rape, and Kick questionnaire    Fear of Current or Ex-Partner: No    Emotionally Abused: No    Physically Abused: No    Sexually Abused: No    FAMILY HISTORY: Family History  Problem Relation Age of Onset   Hypertension Mother    Heart attack Father    Hypertension Daughter    Hypertension Sister    Hypertension Brother    Stroke Neg Hx     ALLERGIES:  has No Known Allergies.  MEDICATIONS:  Current Outpatient Medications  Medication Sig Dispense Refill   apixaban (ELIQUIS) 5 MG TABS tablet Take 1 tablet (5 mg total) by mouth 2 (two)  times daily. 60 tablet 6   APIXABAN (ELIQUIS) VTE STARTER PACK (10MG  AND 5MG ) Take as directed on package: start with two-5mg  tablets twice daily for 7 days. On day 8, switch to one-5mg  tablet twice daily. 74 each 0   atorvastatin (LIPITOR) 20 MG tablet Take 1 tablet (20 mg total) by mouth daily. Please schedule appointment for future refills. 3rd and final attempt. Thank you (Patient taking differently: Take 20 mg by mouth at bedtime.) 15 tablet 0   lisinopril-hydrochlorothiazide (ZESTORETIC) 10-12.5 MG tablet Take 1 tablet by mouth daily.     metoprolol tartrate (LOPRESSOR) 25 MG tablet Take 1 tablet (25 mg total) by mouth 2 (two) times daily. 60 tablet 3   Multiple Vitamins-Minerals (CENTRUM SILVER 50+MEN) TABS Take 1 tablet by mouth daily.     spironolactone (ALDACTONE) 25 MG tablet Take 1 tablet (25 mg total) by mouth daily. 90 tablet 2   No current facility-administered medications for this visit.    REVIEW OF SYSTEMS:  Constitutional: Denies fevers, chills or abnormal night sweats (+) 22 lbs weight loss in 6 months  Eyes: Denies blurriness of vision, double vision or watery eyes Ears, nose, mouth, throat, and face: Denies mucositis or sore throat Respiratory: Denies cough, dyspnea or wheezes Cardiovascular: Denies palpitation, chest discomfort or lower extremity swelling (+) LLE DVT 2016 s/p AVR Gastrointestinal:  Denies nausea, heartburn, bleeding, or change in bowel habits Skin: Denies abnormal skin rashes Lymphatics: Denies new lymphadenopathy or easy bruising Neurological:Denies numbness, tingling or new weaknesses Behavioral/Psych: Mood is stable, no new changes  All other systems were reviewed with the patient and are negative.  PHYSICAL EXAMINATION: ECOG PERFORMANCE STATUS: 0 - Asymptomatic  Vitals:   10/29/22 1237  BP: 116/87  Pulse: 64  Resp: 18  Temp: 97.9 F (36.6 C)  SpO2: 100%   Filed Weights   10/29/22 1237  Weight: 189 lb 1 oz (85.8 kg)     GENERAL:alert, no distress and comfortable SKIN: skin color, texture, turgor are normal, no rashes or significant lesions EYES: normal, conjunctiva are pink and non-injected, sclera clear OROPHARYNX:no exudate, no erythema and lips, buccal mucosa, and tongue normal  NECK: supple, thyroid normal size, non-tender, without nodularity LYMPH:  no palpable lymphadenopathy in the cervical, axillary or inguinal LUNGS: clear to auscultation and percussion with normal breathing effort HEART: regular rate & rhythm and no murmurs and no lower extremity edema ABDOMEN:abdomen soft, non-tender and normal bowel sounds Musculoskeletal:no cyanosis of digits and no clubbing  PSYCH: alert & oriented x 3 with fluent speech NEURO: no focal motor/sensory deficits  LABORATORY DATA:  I have reviewed the data as listed    Latest Ref Rng & Units 10/29/2022    2:00 PM 10/18/2022    5:32 AM 10/17/2022    9:53 AM  CBC  WBC 4.0 - 10.5 K/uL 5.0  5.9  6.1   Hemoglobin 13.0 - 17.0 g/dL 63.8  75.6  43.3   Hematocrit 39.0 - 52.0 % 35.4  33.3  32.6   Platelets 150 - 400 K/uL 211  147  142     @cmpl @  RADIOGRAPHIC STUDIES: I have personally reviewed the radiological images as listed and agreed with the findings in the report. ECHOCARDIOGRAM COMPLETE  Result Date: 10/15/2022    ECHOCARDIOGRAM REPORT   Patient Name:   COEL LINTZ Date of Exam: 10/15/2022 Medical Rec #:  [id number]      Height:       68.0 in Accession #:    [id number]     Weight:       211.0 lb Date of Birth:  1940-05-25      BSA:          2.091 m Patient Age:    82 years       BP:           123/84 mmHg Patient Gender: M              HR:           80 bpm. Exam Location:  Inpatient Procedure: 2D Echo, Color Doppler and Cardiac Doppler Indications:    I26.02 Pulmonary embolus  History:        Patient has prior history of Echocardiogram examinations, most                 recent 05/15/2022. Risk Factors:Hypertension.                 Aortic Valve: 23 mm Magna  valve is present  in the aortic                 position. Procedure Date: 04/11/14.  Sonographer:    Irving Devonne Lalani Senior RDCS Referring Phys: [phone] MATTHEW R HUNSUCKER IMPRESSIONS  1. Left ventricular ejection fraction, by estimation, is 60 to 65%. The left ventricle has normal function. The left ventricle has no regional wall motion abnormalities. There is severe left ventricular hypertrophy of the basal-septal segment. Left ventricular diastolic parameters are consistent with Grade I diastolic dysfunction (impaired relaxation).  2. Right ventricular systolic function is normal. The right ventricular size is mildly enlarged. There is normal pulmonary artery systolic pressure. The estimated right ventricular systolic pressure is 28.2 mmHg.  3. The mitral valve is normal in structure. Trivial mitral valve regurgitation. No evidence of mitral stenosis.  4. The aortic valve has been repaired/replaced Aortic valve regurgitation is not visualized. There is a 23 mm Magna valve present in the aortic position. Procedure Date: 04/11/14. Aortic valve area, by VTI measures 1.10 cm. Aortic valve mean gradient measures 11.0 mmHg. Aortic valve Vmax measures 2.08 m/s.  5. Aortic dilatation noted. There is moderate dilatation of the aortic root, measuring 48 mm.  6. The inferior vena cava is normal in size with greater than 50% respiratory variability, suggesting right atrial pressure of 3 mmHg.  7. Ascending aorta measurements are within normal limits for age when indexed to body surface area. FINDINGS  Left Ventricle: Left ventricular ejection fraction, by estimation, is 60 to 65%. The left ventricle has normal function. The left ventricle has no regional wall motion abnormalities. The left ventricular internal cavity size was normal in size. There is  severe left ventricular hypertrophy of the basal-septal segment. Left ventricular diastolic parameters are consistent with Grade I diastolic dysfunction (impaired relaxation). Normal left  ventricular filling pressure. Right Ventricle: The right ventricular size is mildly enlarged. No increase in right ventricular wall thickness. Right ventricular systolic function is normal. There is normal pulmonary artery systolic pressure. The tricuspid regurgitant velocity is 2.51  m/s, and with an assumed right atrial pressure of 3 mmHg, the estimated right ventricular systolic pressure is 28.2 mmHg. Left Atrium: Left atrial size was normal in size. Right Atrium: Right atrial size was normal in size. Pericardium: There is no evidence of pericardial effusion. Mitral Valve: The mitral valve is normal in structure. Trivial mitral valve regurgitation. No evidence of mitral valve stenosis. Tricuspid Valve: The tricuspid valve is normal in structure. Tricuspid valve regurgitation is mild . No evidence of tricuspid stenosis. Aortic Valve: The aortic valve has been repaired/replaced. Aortic valve regurgitation is not visualized. Aortic valve mean gradient measures 11.0 mmHg. Aortic valve peak gradient measures 17.3 mmHg. Aortic valve area, by VTI measures 1.10 cm. There is a  23 mm Magna valve present in the aortic position. Procedure Date: 04/11/14. Pulmonic Valve: The pulmonic valve was normal in structure. Pulmonic valve regurgitation is not visualized. No evidence of pulmonic stenosis. Aorta: Aortic dilatation noted. Ascending aorta measurements are within normal limits for age when indexed to body surface area. There is moderate dilatation of the aortic root, measuring 48 mm. Venous: The inferior vena cava is normal in size with greater than 50% respiratory variability, suggesting right atrial pressure of 3 mmHg. IAS/Shunts: No atrial level shunt detected by color flow Doppler.  LEFT VENTRICLE PLAX 2D LVIDd:         3.70 cm   Diastology LVIDs:         2.70 cm   LV  e' medial:    6.74 cm/s LV PW:         1.30 cm   LV E/e' medial:  7.2 LV IVS:        2.12 cm   LV e' lateral:   8.49 cm/s LVOT diam:     2.10 cm   LV  E/e' lateral: 5.7 LV SV:         45 LV SV Index:   21 LVOT Area:     3.46 cm  RIGHT VENTRICLE RV S prime:     11.10 cm/s TAPSE (M-mode): 2.1 cm LEFT ATRIUM             Index        RIGHT ATRIUM           Index LA diam:        3.00 cm 1.43 cm/m   RA Area:     17.40 cm LA Vol (A2C):   53.7 ml 25.69 ml/m  RA Volume:   45.90 ml  21.95 ml/m LA Vol (A4C):   53.0 ml 25.35 ml/m LA Biplane Vol: 55.0 ml 26.31 ml/m  AORTIC VALVE AV Area (Vmax):    1.26 cm AV Area (Vmean):   1.17 cm AV Area (VTI):     1.10 cm AV Vmax:           208.00 cm/s AV Vmean:          163.000 cm/s AV VTI:            0.406 m AV Peak Grad:      17.3 mmHg AV Mean Grad:      11.0 mmHg LVOT Vmax:         75.50 cm/s LVOT Vmean:        54.850 cm/s LVOT VTI:          0.130 m LVOT/AV VTI ratio: 0.32  AORTA Ao Root diam: 3.20 cm Ao Asc diam:  4.30 cm MITRAL VALVE               TRICUSPID VALVE MV Area (PHT): 3.58 cm    TR Peak grad:   25.2 mmHg MV Decel Time: 212 msec    TR Vmax:        251.00 cm/s MV E velocity: 48.50 cm/s MV A velocity: 77.50 cm/s  SHUNTS MV E/A ratio:  0.63        Systemic VTI:  0.13 m                            Systemic Diam: 2.10 cm Armanda Magic MD Electronically signed by Armanda Magic MD Signature Date/Time: 10/15/2022/10:06:01 AM    Final    CT Angio Chest PE W and/or Wo Contrast  Result Date: 10/15/2022 CLINICAL DATA:  82 year old male with filling defect in a pulmonary artery branch on prior CT of the abdomen and pelvis. Follow-up study to assess for pulmonary embolism. EXAM: CT ANGIOGRAPHY CHEST WITH CONTRAST TECHNIQUE: Multidetector CT imaging of the chest was performed using the standard protocol during bolus administration of intravenous contrast. Multiplanar CT image reconstructions and MIPs were obtained to evaluate the vascular anatomy. RADIATION DOSE REDUCTION: This exam was performed according to the departmental dose-optimization program which includes automated exposure control, adjustment of the mA and/or kV  according to patient size and/or use of iterative reconstruction technique. CONTRAST:  75mL OMNIPAQUE IOHEXOL 350 MG/ML SOLN COMPARISON:  Chest CTA 04/24/2014. CT of the abdomen and  pelvis 10/14/2022. FINDINGS: Cardiovascular: There are multiple filling defects within pulmonary artery branches in the lungs bilaterally indicative of pulmonary embolism. There is extensive clot bilaterally, with the largest burden in the right lower lobe distribution involving the right lower lobe pulmonary artery and all segmental branches of the right lower lobe basal segments, many of which appear completely occlusive. Additional filling defects are noted on the left involving both left upper and lower lobe distributions, both occlusive and nonocclusive. There is also a potential filling defect in the right ventricle best appreciated on axial images 215 and 216 of series 7, concerning for thrombus. Estimated right ventricular diameter of 54 mm. Estimated left ventricular diameter of 28 mm. RV to LV ratio of 1.93. There is no significant pericardial fluid, thickening or pericardial calcification. There is aortic atherosclerosis, as well as atherosclerosis of the great vessels of the mediastinum and the coronary arteries, including calcified atherosclerotic plaque in the left main, left anterior descending, left circumflex and right coronary arteries. Status post median sternotomy for aortic valve replacement with what appears to be a stented bioprosthesis. Aneurysmal dilatation of the ascending aorta above the anastomosis of the graft estimated to measure 4.9 cm in diameter. Mediastinum/Nodes: No pathologically enlarged mediastinal or hilar lymph nodes. Small hiatal hernia. No axillary lymphadenopathy. Lungs/Pleura: No acute consolidative airspace disease. No pleural effusions. Scattered small pulmonary nodules, largest of which is in the posterior aspect of the right lower lobe (axial image 95 of series 6) measuring 6 mm. Upper  Abdomen: Aortic atherosclerosis. Musculoskeletal: Median sternotomy wires. There are no aggressive appearing lytic or blastic lesions noted in the visualized portions of the skeleton. Review of the MIP images confirms the above findings. IMPRESSION: 1. Study is positive for large burden of occlusive and nonocclusive pulmonary embolism in the lungs bilaterally, probable clot in the right ventricle and CT evidence of right heart strain (RV/LV Ratio = 1.93) consistent with at least submassive (intermediate risk) PE. The presence of right heart strain has been associated with an increased risk of morbidity and mortality. Please refer to the "Code PE Focused" order set in EPIC. 2. Small pulmonary nodules measuring 6 mm or less in size. Non-contrast chest CT at 6 months is recommended. If the nodules are stable at time of repeat CT, then future CT at 18-24 months (from today's scan) is considered optional for low-risk patients, but is recommended for high-risk patients. This recommendation follows the consensus statement: Guidelines for Management of Incidental Pulmonary Nodules Detected on CT Images: From the Fleischner Society 2017; Radiology 2017; 284:228-243. 3. Aortic atherosclerosis, in addition to left main and three-vessel coronary artery disease. Please note that although the presence of coronary artery calcium documents the presence of coronary artery disease, the severity of this disease and any potential stenosis cannot be assessed on this non-gated CT examination. Assessment for potential risk factor modification, dietary therapy or pharmacologic therapy may be warranted, if clinically indicated. 4. Aneurysmal dilatation of the ascending thoracic aorta (4.9 cm in diameter). Recommend semi-annual imaging followup by CTA or MRA and referral to cardiothoracic surgery if not already obtained. This recommendation follows 2010 ACCF/AHA/AATS/ACR/ASA/SCA/SCAI/SIR/STS/SVM Guidelines for the Diagnosis and Management of  Patients With Thoracic Aortic Disease. Circulation. 2010; 121: Q469-G295. Aortic aneurysm NOS (ICD10-I71.9) Critical Value/emergent results were called by telephone at the time of interpretation on 10/15/2022 at 5:37 am to provider [facility], who verbally acknowledged these results. Aortic Atherosclerosis (ICD10-I70.0). Electronically Signed   By: Trudie Reed M.D.   On: 10/15/2022 05:39  CT ABDOMEN PELVIS W CONTRAST  Result Date: 10/14/2022 CLINICAL DATA:  Descending colon mass.  * Tracking Code: BO * EXAM: CT ABDOMEN AND PELVIS WITH CONTRAST TECHNIQUE: Multidetector CT imaging of the abdomen and pelvis was performed using the standard protocol following bolus administration of intravenous contrast. RADIATION DOSE REDUCTION: This exam was performed according to the departmental dose-optimization program which includes automated exposure control, adjustment of the mA and/or kV according to patient size and/or use of iterative reconstruction technique. CONTRAST:  75mL OMNIPAQUE IOHEXOL 350 MG/ML SOLN COMPARISON:  CTA chest dated 04/16/2014, 01/05/2013 FINDINGS: Lower chest: Apparent central filling defect within right lower lobe posterior segmental pulmonary artery (3:3). No pleural effusion or pneumothorax demonstrated. Partially imaged heart size is normal. Coronary artery calcification. Hepatobiliary: No focal hepatic lesions. No intra or extrahepatic biliary ductal dilation. Cholelithiasis. Pancreas: No focal lesions or main ductal dilation. Spleen: Normal in size without focal abnormality. Adrenals/Urinary Tract: 1.7 cm right adrenal nodule (3:26) measures 105 HU and is unchanged from 01/05/2013, likely adenoma. No specific follow-up imaging recommended. No left adrenal nodule. No suspicious renal mass, calculi or hydronephrosis. No focal bladder wall thickening. Stomach/Bowel: Normal appearance of the stomach. Mildly featureless appearance of the splenic flexure with suspected mural thickening  (3:21) may correspond to known descending colon mass. Normal appendix. Vascular/Lymphatic: Aortic atherosclerosis. No enlarged abdominal or pelvic lymph nodes. Reproductive: Enlargement of the prostate with median lobe hypertrophy. Other: No free fluid, fluid collection, or free air. Musculoskeletal: No acute or abnormal lytic or blastic osseous lesions. Multilevel degenerative changes of the partially imaged thoracic and lumbar spine. Grade 1 anterolisthesis at L3-4 and grade 1 retrolisthesis at L5-S1. Small fat-containing paraumbilical and bilateral inguinal hernias code:Marland Kitchen IMPRESSION: 1. Apparent central filling defect within right lower lobe posterior segmental pulmonary artery, which may represent a pulmonary embolism. Recommend correlation with PE protocol CTA chest. 2. Mildly featureless appearance of the splenic flexure with suspected mural thickening may correspond to known descending colon mass. 3. No evidence of metastatic disease in the abdomen or pelvis. 4. Cholelithiasis. 5. Enlargement of the prostate with median lobe hypertrophy. 6.  Aortic Atherosclerosis (ICD10-I70.0). Critical Value/emergent results were called by telephone at the time of interpretation on 10/14/2022 at 7:04 pm to provider Abigail Miyamoto, who verbally acknowledged these results. Electronically Signed   By: Agustin Cree M.D.   On: 10/14/2022 19:12    ASSESSMENT & PLAN: 82 yo male with   Malignant appearing mass in the descending colon -Found during work up for IDA and heme+ stools, colonoscopy ([facility]) showed partially obstructing mass, biopsy showed tubulovillous adenoma with high grade dysplasia. We reviewed this is still highly suspicious for cancer and that superficial biopsies have limitations -He saw colorectal surgeon Dr. Maisie Fus who planned robotic assisted partial colectomy, but he was found to have incidental bilateral PE on staging CT which was otherwise negative for metastatic disease.  -Today's CEA is pending   -Will review his case with Dr. Maisie Fus and cc my note to his cardiologist Dr. Laurance Flatten for cardiac clearance for surgery -We briefly discussed indication for adjuvant chemo vs surveillance, we will see him back after surgery to review final path -Pt seen with Dr. Mosetta Putt  Acute bilateral PE -Incidental finding on staging for colon mass -Asymptomatic -CTA showed possible clot in right ventricle and R heart strain, transthoracic echo did not support that  -Anticoagulated, transitioned to eliquis, tolerating well -Likely secondary to underlying malignancy -Will need admission and lovenox bridge perioperatively, followed by at least 6  months duration  Provoked DVT/PE - 03/2014 s/p AVR -Initial hypercoagulable panel showed +lupus anticoagulant, and elevated Protein S, C but likely reactive   -Pt not sure about anticoagulation, but not on AC with mot recent PE   PLAN: -Endoscopy, path, imaging, and hospital course reviewed -Lab today (iron studies, CBC, CMP, CEA) -CC note to cardiology for clearance -Discuss surgical timeline with Dr. Maisie Fus -Continue Eliquis for now, will need lovenox bridge perioperatively  -Follow up after surgery, to review final path and adjuvant therapy if needed vs surveillance    Orders Placed This Encounter  Procedures   CBC with Differential (Cancer Center Only)    Standing Status:   Future    Number of Occurrences:   1    Standing Expiration Date:   10/29/2023   CEA (Access)-[facility] ONLY    Standing Status:   Future    Number of Occurrences:   1    Standing Expiration Date:   10/29/2023   Ferritin    Standing Status:   Future    Number of Occurrences:   1    Standing Expiration Date:   10/29/2023   Iron and Iron Binding Capacity ([facility] only)    Standing Status:   Future    Number of Occurrences:   1    Standing Expiration Date:   10/29/2023   CMP (Cancer Center only)    Standing Status:   Future    Number of Occurrences:   1    Standing Expiration  Date:   10/29/2023    All questions were answered. The patient knows to call the clinic with any problems, questions or concerns.     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  He presented with iron deficient anemia.  I reviewed his colonoscopy, biopsy results, and CT scan images.  The appearance of the colonic mass in ascending colon is very concerning for malignancy, and the biopsy showed adenoma with high-grade dysplasia.  I recommend surgical resection due to the high concern for malignancy.  He has met Dr. Maisie Fus.  Her surgery may be slightly delayed due to her recent  PE.  I discussed the role of adjuvant chemotherapy in stage III colon cancer, due to his advanced age, he would not be a candidate for intensive chemo.  I plan to see him back after surgery to review his surgical path, and decide if he needs adjuvant capecitabine.  All questions were answered.  I spent a total of 45 minutes for his visit today, more than 50% time on face-to-face counseling.  Malachy Mood MD 10/29/2022

## 2022-10-30 ENCOUNTER — Encounter: Payer: Self-pay | Admitting: Cardiovascular Disease

## 2022-10-30 NOTE — Progress Notes (Unsigned)
Cardiology Office Note:  .   Date:  10/31/2022  ID:  Frederick Burnett, DOB [birth year], MRN [id number] PCP: Irven Coe, MD  [facility] Providers Cardiologist:  previous Marily Memos  now Radley Barto  Click to update primary MD,subspecialty MD or APP then REFRESH:1}   History of Present Illness: . Sept, 4, 2024   Frederick Burnett is a 82 y.o. male of bicuspid AV, s/p AVR with bioprosthetic AV in 2016, HTN, HLD , chronic systolic CHF .  CAD  CABG Laneta Simmers, 2016)  Previously seen by Dr. Katrinka Blazing,   He was recently found to have a colonic mass  Needs pre op evaluation prior to surgery    He was last seen by Dr. Shari Prows in Feb. 2024 Denied any chest pain  BP has been well controlled.    Was found to have a pulmonary embolus by CTA of the lungs on August 19. He was also found to have a mass in his descending colon.  He needs to have preoperative evaluation prior to endoscopy/colonoscopy and possible surgery.  Breathing has been ok  Able to do his normal activities  Mows grass, does some yard work   Echocardiogram from October 15, 2022 reveals normal left ventricular systolic function with a EF of 60 to 65%.  He has severe left ventricular hypertrophy of his basal septal segment.  He has grade 1 diastolic dysfunction. RV is mildly enlarged. Mild aortic stenosis with an mean aortic valve gradient of 11.    ROS:   Studies Reviewed: .            Risk Assessment/Calculations:             Physical Exam:   VS:  BP 90/70   Pulse 68   Ht 5\' 9"  (1.753 m)   Wt 186 lb 3.2 oz (84.5 kg)   SpO2 99%   BMI 27.50 kg/m    Wt Readings from Last 3 Encounters:  10/31/22 186 lb 3.2 oz (84.5 kg)  10/29/22 189 lb 1 oz (85.8 kg)  10/15/22 190 lb 14.4 oz (86.6 kg)    GEN: Well nourished, well developed in no acute distress NECK: No JVD; No carotid bruits CARDIAC: RRR, no murmurs, rubs, gallops RESPIRATORY:  Clear to auscultation without rales, wheezing or rhonchi  ABDOMEN: Soft,  non-tender, non-distended EXTREMITIES:  No edema; No deformity   ASSESSMENT AND PLAN: .      Colonic mass: He has been found to have a mass in his descending colon.  This will need further workup workup including colonoscopy and possible surgery.  He has normal left ventricular systolic function.  He does have a history of coronary artery bypass grafting but he denies having any episodes of angina.  He is at low risk for his upcoming surgery and colonoscopy.  He will need to hold his Eliquis for a short period of time.  I will request the assistance of our Coumadin clinic pharmacist to help with guiding [place] regarding all holding of his Eliquis and any possible bridging that might be needed.     2.  Pulmonary embolus: I suspect that his pulmonary embolus may have been associated with his colon cancer.  Continue Eliquis.  Will hold his Eliquis for short period of time prior to any procedures as noted above.  3.  CAD / CABG :   no angina .  Normal LV function         Dispo:   Signed, Kristeen Miss, MD

## 2022-10-31 ENCOUNTER — Other Ambulatory Visit: Payer: Self-pay

## 2022-10-31 ENCOUNTER — Ambulatory Visit: Payer: Medicare HMO | Attending: Cardiovascular Disease | Admitting: Cardiovascular Disease

## 2022-10-31 ENCOUNTER — Other Ambulatory Visit: Payer: Self-pay | Admitting: Nurse Practitioner

## 2022-10-31 VITALS — BP 90/70 | HR 68 | Ht 69.0 in | Wt 186.2 lb

## 2022-10-31 DIAGNOSIS — I2699 Other pulmonary embolism without acute cor pulmonale: Secondary | ICD-10-CM

## 2022-10-31 DIAGNOSIS — I2510 Atherosclerotic heart disease of native coronary artery without angina pectoris: Secondary | ICD-10-CM

## 2022-10-31 DIAGNOSIS — E785 Hyperlipidemia, unspecified: Secondary | ICD-10-CM

## 2022-10-31 NOTE — Telephone Encounter (Signed)
I will forward to pre op APP for review to see notes from Dr. Elease Hashimoto.

## 2022-10-31 NOTE — Progress Notes (Signed)
Discussed case with medical team, recommending doppler to r/o DVT for consideration of IVC filter before partial colectomy for suspicion for colon cancer. Pt agrees with doppler.   Santiago Glad, NP

## 2022-10-31 NOTE — Patient Instructions (Signed)
Medication Instructions:  Your physician recommends that you continue on your current medications as directed. Please refer to the Current Medication list given to you today.  *If you need a refill on your cardiac medications before your next appointment, please call your pharmacy*  Lab Work: If you have labs (blood work) drawn today and your tests are completely normal, you will receive your results only by: MyChart Message (if you have MyChart) OR A paper copy in the mail If you have any lab test that is abnormal or we need to change your treatment, we will call you to review the results.  Testing/Procedures: None ordered today.  Follow-Up: At [facility], you and your health needs are our priority.  As part of our continuing mission to provide you with exceptional heart care, we have created designated Provider Care Teams.  These Care Teams include your primary Cardiologist (physician) and Advanced Practice Providers (APPs -  Physician Assistants and Nurse Practitioners) who all work together to provide you with the care you need, when you need it.  We recommend signing up for the patient portal called "MyChart".  Sign up information is provided on this After Visit Summary.  MyChart is used to connect with patients for Virtual Visits (Telemedicine).  Patients are able to view lab/test results, encounter notes, upcoming appointments, etc.  Non-urgent messages can be sent to your provider as well.   To learn more about what you can do with MyChart, go to [email or web address].    Your next appointment:   3 month(s)  Provider:   Kristeen Miss, MD     Other Instructions You have been referred to pharmacist prior to surgery for possible bridging.

## 2022-10-31 NOTE — Telephone Encounter (Signed)
Pharmacy please advise on holding Eliquis prior to colon surgery scheduled for TBD. Please seen Dr. Harvie Bridge note concerning his opinion on this procedure. The pre-op request states ASA only, but he is also on Eliquis when I review the chart. Thank you.

## 2022-10-31 NOTE — Telephone Encounter (Signed)
Frederick Burnett has been found to have a descending colon mass.  He was also found to have pulmonary emboli that is likely result of his colon cancer.  He has normal left ventricular systolic function and he is at low risk for his upcoming colonoscopy and colon surgery.    I will request assistance from our pharmacy team / coumadin clinic to help in managing the pausing of his Eliquis therapy and any possible need for bridging.   Kristeen Miss, MD  10/31/2022 2:13 PM    [facility] [address] [place],  [address] [place], [place]  [address] Phone: [phone]; Fax: [phone]

## 2022-11-01 NOTE — Telephone Encounter (Signed)
Patient with diagnosis of PE on Eliquis for anticoagulation.    Procedure: colon surgery Date of procedure: TBD   CrCl 55 Platelet count 211  PE was diagnosed 10/14/22 and patient subsequently started on Eliquis starter pack followed by 5 mg bid.    Per Dr. Elease Hashimoto office note 10/31/22 patient can hold Eliquis for "a short period of time."  I would recommend holding Eliquis 2 days prior to procedure.    Please check with oncology/surgeon as to whether they are okay with lovenox bridge or would prefer to do heparin drip, as the PE is so recent

## 2022-11-06 ENCOUNTER — Ambulatory Visit (HOSPITAL_COMMUNITY)
Admission: RE | Admit: 2022-11-06 | Discharge: 2022-11-06 | Disposition: A | Discharge status: Home / Self Care | Payer: Medicare HMO | Source: Ambulatory Visit | Attending: Nurse Practitioner | Admitting: Nurse Practitioner

## 2022-11-06 ENCOUNTER — Telehealth: Payer: Self-pay | Admitting: *Deleted

## 2022-11-06 DIAGNOSIS — I2699 Other pulmonary embolism without acute cor pulmonale: Secondary | ICD-10-CM | POA: Insufficient documentation

## 2022-11-06 NOTE — Telephone Encounter (Signed)
Received call from Doppler/[facility] reporting that pt was positive for DVT bil legs, mixed acute & chronic & asked if pt can leave.  He is on blood thinner.  Reported to Dr Mosetta Putt & OK to leave.  Discussed with pt per Dr Mosetta Putt & asked if he would be willing to have IR insert IVC filter.  Pt hesitant but said OK to order prior to surgery.

## 2022-11-06 NOTE — Progress Notes (Signed)
BLE venous duplex has been completed.  Preliminary results given to [place], [place].   Results can be found under chart review under CV PROC. 11/06/2022 3:00 PM Zechariah Bissonnette RVT, RDMS

## 2022-11-08 ENCOUNTER — Telehealth: Payer: Self-pay

## 2022-11-08 ENCOUNTER — Ambulatory Visit: Payer: Medicare HMO

## 2022-11-08 NOTE — Telephone Encounter (Signed)
Spoke with pt via telephone to inform pt that Santiago Glad, NP has reviewed the pt's recent labs.  Informed pt that his CEA was 6.34 which is slightly elevated but not alarming.  Stated that Lacie recommends just continuing to monitor the pt's CEA for now.  Also, informed pt that he's slightly anemic d/t renal dysfunction.  Stated Lacie recommends the pt taking a multivitamin that has iron.  Pt verbalized understanding and had no further questions or concerns at this time.

## 2022-11-08 NOTE — Progress Notes (Deleted)
Patient ID: Frederick Burnett                 DOB: [birth year]                    MRN: [id number]     HPI: Frederick Burnett is a 82 y.o. male patient referred to PharmD by Dr Elease Hashimoto. PMH is significant for CAD s/p CABG in 2016, CHF with EF 60-65% 09/2022, HTN, HLD, and bioprosthetic AVR.   On 10/15/22, pt diagnosed with bilateral PE: "Study is positive for large burden of occlusive and nonocclusive pulmonary embolism in the lungs bilaterally, probable clot in the right ventricle and CT evidence of right heart strain (RV/LV Ratio = 1.93) consistent with at least submassive (intermediate risk) PE. He was started on Eliquis. 11/06/22 lower extremity ultrasound showed acute DVT involving the right popliteal vein, left femoral vein, left popliteal vein, left posterior tibial veins, and left peroneal veins. Also showed age indeterminate DVT involving the right and left femoral veins.  Pt is pending colon surgery, date TBD. Will require 2-3 day Eliquis hold with periprocedural bridging given active/recent diffuse VTE.  Discussed with Dr Elease Hashimoto, "I am in favor of Lovenox bridging or possible IV heparin bridging. Whichever seems to be the safest and most feasible route." Lovenox more feasible.  CrCl 23mL/min, weight 84.5kg, will dose with nearest syringe size at Lovenox 80mg  BID.  Past Medical History:  Diagnosis Date   Aortic regurgitation    Aortic regurgitation    Aortic root enlargement (HCC)    Arthritis    both shoulders - RC- wear   Heart murmur    History of pulmonary embolism 04/24/2014   Provoked; occurred after AVR in 2016   HTN (hypertension)    Hx of echocardiogram    Echo 3/16: mild LVH, EF 40-45%, ant-septal AK, AVR with elevated velocity (3.3 m/s - slightly above expected value), mild LAE //  Echo 3/17: Mild LVH, EF 40%, diffuse HK, indeterminate diastolic function, bioprosthetic AVR well-functioning (mean gradient 9 mmHg), MAC, mild LAE, mildly reduced RVSF, mild RAE   Hypercholesteremia      Current Outpatient Medications on File Prior to Visit  Medication Sig Dispense Refill   apixaban (ELIQUIS) 5 MG TABS tablet Take 1 tablet (5 mg total) by mouth 2 (two) times daily. 60 tablet 6   atorvastatin (LIPITOR) 20 MG tablet Take 1 tablet (20 mg total) by mouth daily. Please schedule appointment for future refills. 3rd and final attempt. Thank you (Patient taking differently: Take 20 mg by mouth at bedtime.) 15 tablet 0   lisinopril-hydrochlorothiazide (ZESTORETIC) 10-12.5 MG tablet Take 1 tablet by mouth daily.     metoprolol tartrate (LOPRESSOR) 25 MG tablet Take 1 tablet (25 mg total) by mouth 2 (two) times daily. 60 tablet 3   Multiple Vitamins-Minerals (CENTRUM SILVER 50+MEN) TABS Take 1 tablet by mouth daily.     spironolactone (ALDACTONE) 25 MG tablet Take 1 tablet (25 mg total) by mouth daily. 90 tablet 2   No current facility-administered medications on file prior to visit.    No Known Allergies  Assessment/Plan:  1. Hyperlipidemia -  Reviewed options for lowering LDL cholesterol, including statins, ezetimibe, PCSK9 inhibitors, Nexletol/Nexlizet, and Leqvio. Discussed mechanisms of action, dosing, side effects and potential decreases in LDL cholesterol. Also reviewed cost information and potential options for patient assistance.

## 2022-11-11 ENCOUNTER — Ambulatory Visit: Payer: Medicare HMO | Attending: Internal Medicine | Admitting: Pharmacist

## 2022-11-11 VITALS — Wt 189.0 lb

## 2022-11-11 DIAGNOSIS — I2699 Other pulmonary embolism without acute cor pulmonale: Secondary | ICD-10-CM | POA: Diagnosis not present

## 2022-11-11 DIAGNOSIS — Z86718 Personal history of other venous thrombosis and embolism: Secondary | ICD-10-CM

## 2022-11-11 MED ORDER — ENOXAPARIN SODIUM 80 MG/0.8ML IJ SOSY: 80.0000 mg | PREFILLED_SYRINGE | Freq: Two times a day (BID) | INTRAMUSCULAR | 0 refills | Status: DC

## 2022-11-11 NOTE — Patient Instructions (Addendum)
4 days before procedure: Continue taking your Eliquis twice daily through today. Your last dose of Eliquis will be this evening.  3 days before procedure: Inject enoxaparin (Lovenox) in the fatty tissue of your abdomen every 12 hours, 7:30am and 7:30pm, rotating injection sites. No Eliquis.  2 days before procedure: Inject enoxaparin (Lovenox) in the fatty tissue at 7:30am and 7:30pm. No Eliquis.  1 day before procedure: Inject enoxaparin (Lovenox) in the fatty tissue in the morning at 7:30am. There is no evening injection today. No Eliquis.  Procedure day: No Lovenox, no Eliquis. Resume Eliquis as soon as safely possible because of your recent PEs - discuss with your surgeon when to restart.

## 2022-11-11 NOTE — Progress Notes (Signed)
Patient ID: Frederick Burnett                 DOB: [birth year]                    MRN: [id number]     HPI: Frederick Burnett is a 82 y.o. male patient referred to PharmD by Dr Elease Hashimoto. PMH is significant for CAD s/p CABG in 2016, CHF with EF 60-65% 09/2022, HTN, HLD, and bioprosthetic AVR.   On 10/15/22, pt diagnosed with bilateral PE: "Study is positive for large burden of occlusive and nonocclusive pulmonary embolism in the lungs bilaterally, probable clot in the right ventricle and CT evidence of right heart strain (RV/LV Ratio = 1.93) consistent with at least submassive (intermediate risk) PE. He was started on Eliquis. 11/06/22 lower extremity ultrasound showed acute DVT involving the right popliteal vein, left femoral vein, left popliteal vein, left posterior tibial veins, and left peroneal veins. Also showed age indeterminate DVT involving the right and left femoral veins.  Pt is pending colon surgery, date TBD. Will require 2-3 day Eliquis hold with periprocedural bridging given active/recent diffuse VTE.  Discussed with Dr Elease Hashimoto, "I am in favor of Lovenox bridging or possible IV heparin bridging. Whichever seems to be the safest and most feasible route." Lovenox more feasible.  CrCl 27mL/min, weight 85.7kg today, will dose with nearest syringe size at Lovenox 80mg  BID.  Past Medical History:  Diagnosis Date   Aortic regurgitation    Aortic regurgitation    Aortic root enlargement (HCC)    Arthritis    both shoulders - RC- wear   Heart murmur    History of pulmonary embolism 04/24/2014   Provoked; occurred after AVR in 2016   HTN (hypertension)    Hx of echocardiogram    Echo 3/16: mild LVH, EF 40-45%, ant-septal AK, AVR with elevated velocity (3.3 m/s - slightly above expected value), mild LAE //  Echo 3/17: Mild LVH, EF 40%, diffuse HK, indeterminate diastolic function, bioprosthetic AVR well-functioning (mean gradient 9 mmHg), MAC, mild LAE, mildly reduced RVSF, mild RAE    Hypercholesteremia     Current Outpatient Medications on File Prior to Visit  Medication Sig Dispense Refill   apixaban (ELIQUIS) 5 MG TABS tablet Take 1 tablet (5 mg total) by mouth 2 (two) times daily. 60 tablet 6   atorvastatin (LIPITOR) 20 MG tablet Take 1 tablet (20 mg total) by mouth daily. Please schedule appointment for future refills. 3rd and final attempt. Thank you (Patient taking differently: Take 20 mg by mouth at bedtime.) 15 tablet 0   lisinopril-hydrochlorothiazide (ZESTORETIC) 10-12.5 MG tablet Take 1 tablet by mouth daily.     metoprolol tartrate (LOPRESSOR) 25 MG tablet Take 1 tablet (25 mg total) by mouth 2 (two) times daily. 60 tablet 3   Multiple Vitamins-Minerals (CENTRUM SILVER 50+MEN) TABS Take 1 tablet by mouth daily.     spironolactone (ALDACTONE) 25 MG tablet Take 1 tablet (25 mg total) by mouth daily. 90 tablet 2   No current facility-administered medications on file prior to visit.    No Known Allergies  Assessment/Plan:  1. Anticoagulation - Pt does not have procedure date scheduled yet. Provided him with the below Lovenox bridging instructions and advised him to call with any concerns or if he wishes to re-review instructions once specific procedure date is set.  4 days before procedure: Continue taking your Eliquis twice daily through today. Your last dose of Eliquis will be this evening.  3 days before procedure: Inject enoxaparin (Lovenox) in the fatty tissue of your abdomen every 12 hours, 7:30am and 7:30pm, rotating injection sites. No Eliquis.  2 days before procedure: Inject enoxaparin (Lovenox) in the fatty tissue at 7:30am and 7:30pm. No Eliquis.  1 day before procedure: Inject enoxaparin (Lovenox) in the fatty tissue in the morning at 7:30am. There is no evening injection today. No Eliquis.  Procedure day: No Lovenox, no Eliquis. Resume Eliquis as soon as safely possible because of your recent PEs - discuss with your surgeon when to  restart.  Azaylah Stailey E. Dontray Haberland, PharmD, BCACP, CPP [facility] [address], [place], [place] [address] Phone: [phone]; Fax: [phone] 11/11/2022 1:24 PM

## 2022-11-12 DIAGNOSIS — D124 Benign neoplasm of descending colon: Secondary | ICD-10-CM | POA: Diagnosis not present

## 2022-11-13 ENCOUNTER — Other Ambulatory Visit: Payer: Self-pay

## 2022-11-13 ENCOUNTER — Other Ambulatory Visit: Payer: Self-pay | Admitting: Hematology

## 2022-11-13 DIAGNOSIS — I2699 Other pulmonary embolism without acute cor pulmonale: Secondary | ICD-10-CM

## 2022-11-15 ENCOUNTER — Ambulatory Visit
Admission: RE | Admit: 2022-11-15 | Discharge: 2022-11-15 | Disposition: A | Discharge status: Home / Self Care | Payer: Medicare HMO | Source: Ambulatory Visit | Attending: Hematology

## 2022-11-15 DIAGNOSIS — I2699 Other pulmonary embolism without acute cor pulmonale: Secondary | ICD-10-CM

## 2022-11-15 NOTE — Consult Note (Signed)
Chief Complaint: IVC Filter Placement prior to surgery  Referring Physician(s): Frederick Burnett,Frederick Burnett  History of Present Illness: Frederick Burnett is a 82 y.o. male with history of pulmonary embolism and acute on chronic lower extremity DVT who is planning to undergo colon resection for treatment of colon cancer and requires interruption of anticoagulation.  He presents to IR clinic today through tele visit to discuss the steps, risks, benefits, and management of IVC filter placement as well as retrieval.    He was diagnosed with bilateral pulmonary artery embolism on 10/15/2022.  He was treated with Eliquis.  Repeat lower extremity Doppler on 11/06/2022 shows acute right and age indeterminate left lower extremity DVT.    He was undergoing evaluation for anemia and heme-positive stool.  Colonoscopy on 09/03/2022 showed a fungating partially obstructing mass of the descending colon consistent with tubulovillous adenoma with high-grade dysplasia.  His scheduled to undergo partial colectomy.   Past Medical History:  Diagnosis Date   Aortic regurgitation    Aortic regurgitation    Aortic root enlargement (HCC)    Arthritis    both shoulders - RC- wear   Heart murmur    History of pulmonary embolism 04/24/2014   Provoked; occurred after AVR in 2016   HTN (hypertension)    Hx of echocardiogram    Echo 3/16: mild LVH, EF 40-45%, ant-septal AK, AVR with elevated velocity (3.3 m/s - slightly above expected value), mild LAE //  Echo 3/17: Mild LVH, EF 40%, diffuse HK, indeterminate diastolic function, bioprosthetic AVR well-functioning (mean gradient 9 mmHg), MAC, mild LAE, mildly reduced RVSF, mild RAE   Hypercholesteremia     Past Surgical History:  Procedure Laterality Date   AORTIC VALVE REPLACEMENT N/A 04/11/2014   Procedure: AORTIC VALVE REPLACEMENT (AVR);  Surgeon: Alleen Borne, MD;  Location: [facility] OR;  Service: Open Heart Surgery;  Laterality: N/A;   CARDIAC CATHETERIZATION     LEFT AND  RIGHT HEART CATHETERIZATION WITH CORONARY ANGIOGRAM N/A 02/22/2014   Procedure: LEFT AND RIGHT HEART CATHETERIZATION WITH CORONARY ANGIOGRAM;  Surgeon: Lesleigh Noe, MD;  Location: [facility] CATH LAB;  Service: Cardiovascular;  Laterality: N/A;   MULTIPLE TOOTH EXTRACTIONS     TEE WITHOUT CARDIOVERSION N/A 03/24/2014   Procedure: TRANSESOPHAGEAL ECHOCARDIOGRAM (TEE);  Surgeon: Lars Masson, MD;  Location: [facility] ENDOSCOPY;  Service: Cardiovascular;  Laterality: N/A;   TEE WITHOUT CARDIOVERSION N/A 04/11/2014   Procedure: TRANSESOPHAGEAL ECHOCARDIOGRAM (TEE);  Surgeon: Alleen Borne, MD;  Location: [facility] OR;  Service: Open Heart Surgery;  Laterality: N/A;    Allergies: Patient has no known allergies.  Medications: Prior to Admission medications   Medication Sig Start Date End Date Taking? Authorizing Provider  apixaban (ELIQUIS) 5 MG TABS tablet Take 1 tablet (5 mg total) by mouth 2 (two) times daily. 10/29/22   Pollyann Samples, NP  atorvastatin (LIPITOR) 20 MG tablet Take 1 tablet (20 mg total) by mouth daily. Please schedule appointment for future refills. 3rd and final attempt. Thank you Patient taking differently: Take 20 mg by mouth at bedtime. 08/13/21   Lyn Records, MD  enoxaparin (LOVENOX) 80 MG/0.8ML injection Inject 0.8 mLs (80 mg total) into the skin every 12 (twelve) hours. 11/11/22   Nahser, Deloris Ping, MD  lisinopril-hydrochlorothiazide (ZESTORETIC) 10-12.5 MG tablet Take 1 tablet by mouth daily.    [provider]  metoprolol tartrate (LOPRESSOR) 25 MG tablet Take 1 tablet (25 mg total) by mouth 2 (two) times daily. 04/15/14   Barrett,  Erin R, PA-C  Multiple Vitamins-Minerals (CENTRUM SILVER 50+MEN) TABS Take 1 tablet by mouth daily.    [provider]  spironolactone (ALDACTONE) 25 MG tablet Take 1 tablet (25 mg total) by mouth daily. 04/17/22   Meriam Sprague, MD     Family History  Problem Relation Age of Onset   Hypertension Mother    Heart attack Father     Hypertension Daughter    Hypertension Sister    Hypertension Brother    Stroke Neg Hx     Social History   Socioeconomic History   Marital status: Widowed    Spouse name: Not on file   Number of children: Not on file   Years of education: Not on file   Highest education level: Not on file  Occupational History   Not on file  Tobacco Use   Smoking status: Former    Types: Cigars    Quit date: 05/29/2001    Years since quitting: 21.4   Smokeless tobacco: Never  Substance and Sexual Activity   Alcohol use: No    Alcohol/week: 0.0 standard drinks of alcohol   Drug use: No   Sexual activity: Not on file  Other Topics Concern   Not on file  Social History Narrative   Not on file   Social Determinants of Health   Financial Resource Strain: Not on file  Food Insecurity: No Food Insecurity (10/15/2022)   Hunger Vital Sign    Worried About Running Out of Food in the Last Year: Never true    Ran Out of Food in the Last Year: Never true  Transportation Needs: No Transportation Needs (10/15/2022)   PRAPARE - Administrator, Civil Service (Medical): No    Lack of Transportation (Non-Medical): No  Physical Activity: Not on file  Stress: Not on file  Social Connections: Not on file   Review of Systems  Review of Systems: A 12 point ROS discussed and pertinent positives are indicated in the HPI above.  All other systems are negative.  Advance Care Plan: The advanced care plan/surrogate decision maker was discussed at the time of visit and the patient did not wish to discuss or was not able to name a surrogate decision maker or provide an advance care plan.   Physical Exam No direct physical exam was performed (except for noted visual exam findings with Video Visits).    Vital Signs: There were no vitals taken for this visit.  Imaging: VAS [place] LOWER EXTREMITY VENOUS (DVT)  Result Date: 11/07/2022  Lower Venous DVT Study [name]  Date of Exam:    11/06/2022 Medical Rec #: [id number]       Accession #:    [id number] Date of Birth: November 12, 1940       Patient Gender: M Patient Age:   57 years Exam Location:  [facility] Procedure:      VAS [place] LOWER EXTREMITY VENOUS (DVT) Referring Phys: Santiago Glad --------------------------------------------------------------------------------  Indications: Pulmonary embolism.  Risk Factors: HX of PE/DVT post AVR surgery in 2016 recurrent PE 09/2022. Anticoagulation: Eliquis. Comparison Study: Previous exam 04/24/2014 was positive for DVT in BLE (RLE FV &                   LLE CFV, FV, PFV, PopV, PTV, PeroV) Performing Technologist: Jody Hill RVT, RDMS  Examination Guidelines: A complete evaluation includes B-mode imaging, spectral Doppler, color Doppler, and power Doppler as needed of all accessible portions  of each vessel. Bilateral testing is considered an integral part of a complete examination. Limited examinations for reoccurring indications may be performed as noted. The reflux portion of the exam is performed with the patient in reverse Trendelenburg.  +---------+---------------+---------+-----------+----------+-----------------+ RIGHT    CompressibilityPhasicitySpontaneityPropertiesThrombus Aging    +---------+---------------+---------+-----------+----------+-----------------+ CFV      Full           Yes      Yes                                    +---------+---------------+---------+-----------+----------+-----------------+ SFJ      Full                                                           +---------+---------------+---------+-----------+----------+-----------------+ FV Prox  Partial        Yes      Yes                  Age Indeterminate +---------+---------------+---------+-----------+----------+-----------------+ FV Mid   Partial        Yes      Yes                  Age Indeterminate +---------+---------------+---------+-----------+----------+-----------------+ FV  DistalFull           Yes      Yes                                    +---------+---------------+---------+-----------+----------+-----------------+ PFV      Full                                                           +---------+---------------+---------+-----------+----------+-----------------+ POP      Partial        Yes      Yes                  Acute             +---------+---------------+---------+-----------+----------+-----------------+ PTV      Full                                                           +---------+---------------+---------+-----------+----------+-----------------+ PERO     Full                                                           +---------+---------------+---------+-----------+----------+-----------------+   +---------+---------------+---------+-----------+----------+------------------+ LEFT     CompressibilityPhasicitySpontaneityPropertiesThrombus Aging     +---------+---------------+---------+-----------+----------+------------------+ CFV      Full           Yes      Yes                                     +---------+---------------+---------+-----------+----------+------------------+  SFJ      Full                                                            +---------+---------------+---------+-----------+----------+------------------+ FV Prox  Partial        Yes      Yes                  Age Indeterminate  +---------+---------------+---------+-----------+----------+------------------+ FV Mid   Partial        Yes      Yes                  Age Indeterminate  +---------+---------------+---------+-----------+----------+------------------+ FV DistalPartial        Yes      Yes                  Acute              +---------+---------------+---------+-----------+----------+------------------+ PFV      Full                                                             +---------+---------------+---------+-----------+----------+------------------+ POP      Partial        Yes      Yes                  Acute              +---------+---------------+---------+-----------+----------+------------------+ PTV      Partial        No       No                   Acute - one of                                                           paired             +---------+---------------+---------+-----------+----------+------------------+ PERO     None           No       No                   Acute              +---------+---------------+---------+-----------+----------+------------------+     Summary: RIGHT: - Findings consistent with acute deep vein thrombosis involving the right popliteal vein.  - Findings consistent with age indeterminate deep vein thrombosis involving the right femoral vein.   LEFT: - Findings consistent with acute deep vein thrombosis involving the left femoral vein, left popliteal vein, left posterior tibial veins, and left peroneal veins.  - Findings consistent with age indeterminate deep vein thrombosis involving the left femoral vein.   *See table(s) above for measurements and observations. Electronically signed by Gerarda Fraction on 11/07/2022 at 2:59:40 PM.    Final     Labs:  CBC: Recent Labs    10/16/22  1234 10/17/22 0953 10/18/22 0532 10/29/22 1400  WBC 6.7 6.1 5.9 5.0  HGB 11.4* 10.7* 10.9* 11.7*  HCT 35.8* 32.6* 33.3* 35.4*  PLT 147* 142* 147* 211    COAGS: No results for input(s): "INR", "APTT" in the last 8760 hours.  BMP: Recent Labs    10/14/22 2215 10/14/22 2325 10/15/22 0510 10/16/22 0315 10/17/22 0953 10/29/22 1400  NA 136  --   --  135 135 139  K 5.9*  --  3.6 4.4 3.8 4.8  CL 105  --   --  105 105 107  CO2 20*  --   --  17* 20* 26  GLUCOSE 92  --   --  103* 98 95  BUN 17  --   --  13 10 17   CALCIUM 9.0  --   --  8.6* 8.8* 9.5  CREATININE 1.49* 1.50*  --  1.46* 1.25* 1.41*  GFRNONAA 47*  --   --  48*  57* 50*    LIVER FUNCTION TESTS: Recent Labs    10/14/22 2215 10/29/22 1400  BILITOT 1.6* 0.4  AST 53* 16  ALT 24 14  ALKPHOS 100 95  PROT 7.9 7.7  ALBUMIN 4.2 4.1    TUMOR MARKERS: Recent Labs    10/29/22 1400  CEA 6.34*    Assessment and Plan:  82 year-old gentleman with history of colon cancer and pulmonary embolism and current lower extremity DVT requires interruption of anticoagulation prior to colon resection.  He presents to IR clinic through tele visit to discuss the steps, risks, benefits, and management of IVC filter.   I discussed with him in detail the steps of the filter placement.  I explained that the filter could be placed at any time, but will likely be placed the day of the surgery or few days prior.  I also explained to him that we would attempt to remove the filter as soon as he could be started on anticoagulation.  I explained that rarely the filters can not be removed for various reasons.   We discussed the risks and benefits including, but not limited to bleeding, infection, contrast induced renal failure, filter fracture or migration which can lead to emergency surgery or even death, strut penetration with damage or irritation to adjacent structures and caval thrombosis.  All of the patient's questions were answered.    He will contact Dr. Latanya Maudlin office and let them know if he would like to proceed.  If patient would like to proceed, please schedule for filter placement the day of surgery or few days prior to surgery.  Thank you for this interesting consult.  I greatly enjoyed meeting Frederick Burnett and look forward to participating in their care.  A copy of this report was sent to the requesting provider on this date.  Electronically Signed: Al Corpus Kathalina Ostermann 11/15/2022, 3:29 PM   I spent a total of  30 Minutes  in remote  clinical consultation, greater than 50% of which was counseling/coordinating care for IVC Filter Placement.    Visit type: Audio  only (telephone). Audio (no video) only due to technical limitations. Alternative for in-person consultation at [facility], [address] [place], [place], [place]. This visit type was conducted due to national recommendations for restrictions regarding the COVID-19 Pandemic (e.g. social distancing).  This format is felt to be most appropriate for this patient at this time.  All issues noted in this document were discussed and addressed.

## 2022-11-21 ENCOUNTER — Telehealth (HOSPITAL_COMMUNITY): Payer: Self-pay

## 2022-11-21 NOTE — Telephone Encounter (Signed)
Called to schedule IVC filter placement prior to colon surgery. Pt isn't sure when colon surgery is scheduled yet. I will keep a check and pt will also give me a call when he finds out when surgery is scheduled. AB

## 2022-11-27 ENCOUNTER — Encounter: Payer: Self-pay | Admitting: Surgery

## 2022-11-27 ENCOUNTER — Institutional Professional Consult (permissible substitution): Payer: Medicare HMO | Admitting: Surgery

## 2022-11-27 VITALS — BP 101/69 | HR 77 | Resp 18 | Ht 69.0 in | Wt 189.0 lb

## 2022-11-27 DIAGNOSIS — I712 Thoracic aortic aneurysm, without rupture, unspecified: Secondary | ICD-10-CM | POA: Diagnosis not present

## 2022-11-27 NOTE — Progress Notes (Signed)
Cardiothoracic Surgery Consultation  PCP is Irven Coe, MD Referring Provider is Irven Coe, MD  Chief Complaint  Patient presents with   Thoracic Aortic Aneurysm    CTA chest 8/20, ECHO 8/20    HPI:  The patient is an 82 year old gentleman with history of hypertension, hypercholesterolemia, pulmonary embolism, and aortic insufficiency who underwent aortic valve replacement with a 23 mm Magna-ease pericardial valve by me on 04/11/2014.  He returned a few weeks after discharge with shortness of breath and was diagnosed with bilateral pulmonary emboli treated with anticoagulation.  This was eventually discontinued.  In August 2024 he was undergoing workup for a colon mass and outpatient CT scan showed filling defects in the pulmonary arteries and he was sent to the emergency department.  CTA of the chest showed a large pulmonary embolism bilaterally with significant clot burden and right sided heart strain.  There is possibility of clot in the RV.  He had a transthoracic echo that did not show any right heart strain or any clot in the right ventricle.  He remained asymptomatic with normal oxygen saturations and was started on anticoagulation with Eliquis.  He has continued to feel well and is scheduled to undergo colectomy for colon cancer in the near future.  He was referred back to me because his CTA of the chest showed an ascending aortic aneurysm beyond the aortotomy measuring 4.9 cm in diameter.   Past Medical History:  Diagnosis Date   Aortic regurgitation    Aortic regurgitation    Aortic root enlargement (HCC)    Arthritis    both shoulders - RC- wear   Heart murmur    History of pulmonary embolism 04/24/2014   Provoked; occurred after AVR in 2016   HTN (hypertension)    Hx of echocardiogram    Echo 3/16: mild LVH, EF 40-45%, ant-septal AK, AVR with elevated velocity (3.3 m/s - slightly above expected value), mild LAE //  Echo 3/17: Mild LVH, EF 40%, diffuse HK, indeterminate  diastolic function, bioprosthetic AVR well-functioning (mean gradient 9 mmHg), MAC, mild LAE, mildly reduced RVSF, mild RAE   Hypercholesteremia     Past Surgical History:  Procedure Laterality Date   AORTIC VALVE REPLACEMENT N/A 04/11/2014   Procedure: AORTIC VALVE REPLACEMENT (AVR);  Surgeon: Alleen Borne, MD;  Location: [facility] OR;  Service: Open Heart Surgery;  Laterality: N/A;   CARDIAC CATHETERIZATION     LEFT AND RIGHT HEART CATHETERIZATION WITH CORONARY ANGIOGRAM N/A 02/22/2014   Procedure: LEFT AND RIGHT HEART CATHETERIZATION WITH CORONARY ANGIOGRAM;  Surgeon: Lesleigh Noe, MD;  Location: [facility] CATH LAB;  Service: Cardiovascular;  Laterality: N/A;   MULTIPLE TOOTH EXTRACTIONS     TEE WITHOUT CARDIOVERSION N/A 03/24/2014   Procedure: TRANSESOPHAGEAL ECHOCARDIOGRAM (TEE);  Surgeon: Lars Masson, MD;  Location: [facility] ENDOSCOPY;  Service: Cardiovascular;  Laterality: N/A;   TEE WITHOUT CARDIOVERSION N/A 04/11/2014   Procedure: TRANSESOPHAGEAL ECHOCARDIOGRAM (TEE);  Surgeon: Alleen Borne, MD;  Location: [facility] OR;  Service: Open Heart Surgery;  Laterality: N/A;    Family History  Problem Relation Age of Onset   Hypertension Mother    Heart attack Father    Hypertension Daughter    Hypertension Sister    Hypertension Brother    Stroke Neg Hx     Social History Social History   Tobacco Use   Smoking status: Former    Types: Cigars    Quit date: 05/29/2001    Years since quitting: 21.5  Smokeless tobacco: Never  Substance Use Topics   Alcohol use: No    Alcohol/week: 0.0 standard drinks of alcohol   Drug use: No    Current Outpatient Medications  Medication Sig Dispense Refill   apixaban (ELIQUIS) 5 MG TABS tablet Take 1 tablet (5 mg total) by mouth 2 (two) times daily. 60 tablet 6   atorvastatin (LIPITOR) 20 MG tablet Take 1 tablet (20 mg total) by mouth daily. Please schedule appointment for future refills. 3rd and final attempt. Thank you (Patient taking differently: Take  20 mg by mouth at bedtime.) 15 tablet 0   enoxaparin (LOVENOX) 80 MG/0.8ML injection Inject 0.8 mLs (80 mg total) into the skin every 12 (twelve) hours. 4 mL 0   lisinopril-hydrochlorothiazide (ZESTORETIC) 10-12.5 MG tablet Take 1 tablet by mouth daily.     metoprolol tartrate (LOPRESSOR) 25 MG tablet Take 1 tablet (25 mg total) by mouth 2 (two) times daily. 60 tablet 3   Multiple Vitamins-Minerals (CENTRUM SILVER 50+MEN) TABS Take 1 tablet by mouth daily.     spironolactone (ALDACTONE) 25 MG tablet Take 1 tablet (25 mg total) by mouth daily. 90 tablet 2   No current facility-administered medications for this visit.    No Known Allergies  Review of Systems  Constitutional:  Negative for activity change, appetite change and fatigue.  HENT: Negative.    Eyes: Negative.   Respiratory:  Negative for shortness of breath.   Cardiovascular:  Negative for chest pain.  Gastrointestinal:  Negative for abdominal pain.  Musculoskeletal: Negative.   Skin: Negative.   Neurological:  Negative for dizziness and syncope.  Hematological: Negative.   Psychiatric/Behavioral: Negative.      BP 101/69 (BP Location: Right Arm, Patient Position: Sitting)   Pulse 77   Resp 18   Ht 5\' 9"  (1.753 m)   Wt 189 lb (85.7 kg)   SpO2 97% Comment: RA  BMI 27.91 kg/m  Physical Exam Constitutional:      Appearance: Normal appearance. He is normal weight.  Eyes:     Extraocular Movements: Extraocular movements intact.     Conjunctiva/sclera: Conjunctivae normal.     Pupils: Pupils are equal, round, and reactive to light.  Neck:     Vascular: No carotid bruit.  Cardiovascular:     Rate and Rhythm: Normal rate and regular rhythm.     Pulses: Normal pulses.     Heart sounds: Normal heart sounds. No murmur heard. Pulmonary:     Effort: Pulmonary effort is normal.     Breath sounds: Normal breath sounds.  Abdominal:     General: Abdomen is flat. There is no distension.     Palpations: Abdomen is soft.      Tenderness: There is no abdominal tenderness.  Musculoskeletal:        General: No swelling.  Skin:    General: Skin is warm and dry.  Neurological:     General: No focal deficit present.     Mental Status: He is alert and oriented to person, place, and time.  Psychiatric:        Mood and Affect: Mood normal.        Behavior: Behavior normal.      Diagnostic Tests:  Narrative & Impression  CLINICAL DATA:  82 year old male with filling defect in a pulmonary artery branch on prior CT of the abdomen and pelvis. Follow-up study to assess for pulmonary embolism.   EXAM: CT ANGIOGRAPHY CHEST WITH CONTRAST   TECHNIQUE: Multidetector CT imaging of  the chest was performed using the standard protocol during bolus administration of intravenous contrast. Multiplanar CT image reconstructions and MIPs were obtained to evaluate the vascular anatomy.   RADIATION DOSE REDUCTION: This exam was performed according to the departmental dose-optimization program which includes automated exposure control, adjustment of the mA and/or kV according to patient size and/or use of iterative reconstruction technique.   CONTRAST:  75mL OMNIPAQUE IOHEXOL 350 MG/ML SOLN   COMPARISON:  Chest CTA 04/24/2014. CT of the abdomen and pelvis 10/14/2022.   FINDINGS: Cardiovascular: There are multiple filling defects within pulmonary artery branches in the lungs bilaterally indicative of pulmonary embolism. There is extensive clot bilaterally, with the largest burden in the right lower lobe distribution involving the right lower lobe pulmonary artery and all segmental branches of the right lower lobe basal segments, many of which appear completely occlusive. Additional filling defects are noted on the left involving both left upper and lower lobe distributions, both occlusive and nonocclusive. There is also a potential filling defect in the right ventricle best appreciated on axial images 215 and 216 of  series 7, concerning for thrombus. Estimated right ventricular diameter of 54 mm. Estimated left ventricular diameter of 28 mm. RV to LV ratio of 1.93. There is no significant pericardial fluid, thickening or pericardial calcification. There is aortic atherosclerosis, as well as atherosclerosis of the great vessels of the mediastinum and the coronary arteries, including calcified atherosclerotic plaque in the left main, left anterior descending, left circumflex and right coronary arteries. Status post median sternotomy for aortic valve replacement with what appears to be a stented bioprosthesis. Aneurysmal dilatation of the ascending aorta above the anastomosis of the graft estimated to measure 4.9 cm in diameter.   Mediastinum/Nodes: No pathologically enlarged mediastinal or hilar lymph nodes. Small hiatal hernia. No axillary lymphadenopathy.   Lungs/Pleura: No acute consolidative airspace disease. No pleural effusions. Scattered small pulmonary nodules, largest of which is in the posterior aspect of the right lower lobe (axial image 95 of series 6) measuring 6 mm.   Upper Abdomen: Aortic atherosclerosis.   Musculoskeletal: Median sternotomy wires. There are no aggressive appearing lytic or blastic lesions noted in the visualized portions of the skeleton.   Review of the MIP images confirms the above findings.   IMPRESSION: 1. Study is positive for large burden of occlusive and nonocclusive pulmonary embolism in the lungs bilaterally, probable clot in the right ventricle and CT evidence of right heart strain (RV/LV Ratio = 1.93) consistent with at least submassive (intermediate risk) PE. The presence of right heart strain has been associated with an increased risk of morbidity and mortality. Please refer to the "Code PE Focused" order set in EPIC. 2. Small pulmonary nodules measuring 6 mm or less in size. Non-contrast chest CT at 6 months is recommended. If the nodules  are stable at time of repeat CT, then future CT at 18-24 months (from today's scan) is considered optional for low-risk patients, but is recommended for high-risk patients. This recommendation follows the consensus statement: Guidelines for Management of Incidental Pulmonary Nodules Detected on CT Images: From the Fleischner Society 2017; Radiology 2017; 284:228-243. 3. Aortic atherosclerosis, in addition to left main and three-vessel coronary artery disease. Please note that although the presence of coronary artery calcium documents the presence of coronary artery disease, the severity of this disease and any potential stenosis cannot be assessed on this non-gated CT examination. Assessment for potential risk factor modification, dietary therapy or pharmacologic therapy may be warranted, if clinically  indicated. 4. Aneurysmal dilatation of the ascending thoracic aorta (4.9 cm in diameter). Recommend semi-annual imaging followup by CTA or MRA and referral to cardiothoracic surgery if not already obtained. This recommendation follows 2010 ACCF/AHA/AATS/ACR/ASA/SCA/SCAI/SIR/STS/SVM Guidelines for the Diagnosis and Management of Patients With Thoracic Aortic Disease. Circulation. 2010; 121: J478-G956. Aortic aneurysm NOS (ICD10-I71.9)   Critical Value/emergent results were called by telephone at the time of interpretation on 10/15/2022 at 5:37 am to provider [facility], who verbally acknowledged these results.   Aortic Atherosclerosis (ICD10-I70.0).     Electronically Signed   By: Trudie Reed M.D.   On: 10/15/2022 05:39      Impression:  This 82 year old gentleman has developed a 4.9 cm aneurysm of the ascending aorta status post aortic valve replacement in 2016.  This looks like it has developed in the vicinity of the previous aortotomy and is more of a saccular bulging than a smooth fusiform aneurysm.  The entire ascending aorta appears larger than it did in 2016 when he  had a CTA of the chest for pulmonary embolism after surgery.  The aorta beyond the aneurysm measures about 3.5 cm.  His aneurysm is still below the surgical threshold of 5.5 cm but it has a more worrisome quality being more saccular.  He currently has a colon cancer that needs to be resected and I have recommended that he have a follow-up CT of the chest in 6 months to reevaluate this area.  He is 82 years old with comorbidities and certainly replacement of the ascending aorta and possibly the aortic root and aortic valve would be a high risk procedure for him with significant risk of morbidity and mortality.  Plan:  I will see him back in 6 months with a CTA of the chest.  I spent 30 minutes performing this consultation and > 50% of this time was spent face to face counseling and coordinating the care of this patient's ascending aortic aneurysm.   Alleen Borne, MD [facility] [phone]

## 2022-12-17 ENCOUNTER — Ambulatory Visit: Payer: Medicare HMO | Admitting: Cardiovascular Disease

## 2023-01-03 ENCOUNTER — Other Ambulatory Visit: Payer: Self-pay | Admitting: Urology

## 2023-01-03 DIAGNOSIS — I2699 Other pulmonary embolism without acute cor pulmonale: Secondary | ICD-10-CM

## 2023-01-06 ENCOUNTER — Other Ambulatory Visit: Payer: Self-pay

## 2023-01-06 ENCOUNTER — Encounter (HOSPITAL_COMMUNITY): Payer: Self-pay

## 2023-01-06 ENCOUNTER — Ambulatory Visit (HOSPITAL_COMMUNITY)
Admission: RE | Admit: 2023-01-06 | Discharge: 2023-01-06 | Disposition: A | Discharge status: Home / Self Care | Payer: Medicare HMO | Source: Ambulatory Visit | Attending: Hematology | Admitting: Hematology

## 2023-01-06 ENCOUNTER — Other Ambulatory Visit: Payer: Self-pay | Admitting: Student

## 2023-01-06 DIAGNOSIS — Z408 Encounter for other prophylactic surgery: Secondary | ICD-10-CM | POA: Diagnosis not present

## 2023-01-06 DIAGNOSIS — Z86711 Personal history of pulmonary embolism: Secondary | ICD-10-CM | POA: Diagnosis not present

## 2023-01-06 DIAGNOSIS — Z86718 Personal history of other venous thrombosis and embolism: Secondary | ICD-10-CM | POA: Insufficient documentation

## 2023-01-06 DIAGNOSIS — Z7901 Long term (current) use of anticoagulants: Secondary | ICD-10-CM | POA: Insufficient documentation

## 2023-01-06 DIAGNOSIS — I8290 Acute embolism and thrombosis of unspecified vein: Secondary | ICD-10-CM | POA: Diagnosis not present

## 2023-01-06 DIAGNOSIS — Z87891 Personal history of nicotine dependence: Secondary | ICD-10-CM | POA: Diagnosis not present

## 2023-01-06 DIAGNOSIS — I2699 Other pulmonary embolism without acute cor pulmonale: Secondary | ICD-10-CM

## 2023-01-06 HISTORY — PX: IR IVC FILTER PLMT / S&I /IMG GUID/MOD SED: IMG701

## 2023-01-06 LAB — BASIC METABOLIC PANEL
Anion gap: 7 (ref 5–15)
BUN: 11 mg/dL (ref 8–23)
CO2: 25 mmol/L (ref 22–32)
Calcium: 9.1 mg/dL (ref 8.9–10.3)
Chloride: 103 mmol/L (ref 98–111)
Creatinine, Ser: 1.1 mg/dL (ref 0.61–1.24)
GFR, Estimated: >60 mL/min (ref >60)
Glucose, Bld: 88 mg/dL (ref 70–99)
Potassium: 4.2 mmol/L (ref 3.5–5.1)
Sodium: 135 mmol/L (ref 135–145)

## 2023-01-06 MED ORDER — MIDAZOLAM HCL 2 MG/2ML IJ SOLN
INTRAMUSCULAR | Status: AC
Start: 2023-01-06 — End: ?
  Filled 2023-01-06: qty 2

## 2023-01-06 MED ORDER — FENTANYL CITRATE (PF) 100 MCG/2ML IJ SOLN
INTRAMUSCULAR | Status: AC | PRN
  Administered 2023-01-06 (×2): 25 ug via INTRAVENOUS

## 2023-01-06 MED ORDER — MIDAZOLAM HCL 2 MG/2ML IJ SOLN
INTRAMUSCULAR | Status: AC | PRN
  Administered 2023-01-06: .5 mg via INTRAVENOUS
  Administered 2023-01-06: 1 mg via INTRAVENOUS

## 2023-01-06 MED ORDER — FENTANYL CITRATE (PF) 100 MCG/2ML IJ SOLN
INTRAMUSCULAR | Status: AC
  Filled 2023-01-06: qty 2

## 2023-01-06 MED ORDER — LIDOCAINE-EPINEPHRINE 1 %-1:100000 IJ SOLN
INTRAMUSCULAR | Status: AC
Start: 2023-01-06 — End: ?
  Filled 2023-01-06: qty 1

## 2023-01-06 MED ORDER — IOHEXOL 300 MG/ML  SOLN
150.0000 mL | Freq: Once | INTRAMUSCULAR | Status: AC | PRN
  Administered 2023-01-06: 50 mL via INTRAVENOUS

## 2023-01-06 NOTE — Procedures (Signed)
Interventional Radiology Procedure Note  Procedure: IVC Filter placement  Findings: Please refer to procedural dictation for full description. Infrarenal Option Elite filter placed.  Complications: None immediate  Estimated Blood Loss: < 5 mL  Recommendations: IR will arrange 3 month outpatient follow up for consideration of filter retrieval.   Marliss Coots, MD

## 2023-01-06 NOTE — Progress Notes (Signed)
Interventional Radiology Brief Note:  Order placed for follow-up with Dr. Elby Showers in clinic in 3 months with CT abd/pelvis with contrast, and bilateral DVT US.   Loyce Dys, MS RD PA-C

## 2023-01-06 NOTE — H&P (Signed)
Chief Complaint: Patient was seen in consultation today for No chief complaint on file.  at the request of Feng,Yan  Referring Physician(s): Feng,Yan  Supervising Physician: Marliss Coots  Patient Status: [facility] - Out-pt  History of Present Illness:  FULL Code per chart  Frederick Burnett is a 82 y.o. male with history of pulmonary embolism and acute on chronic lower extremity DVT who is scheduled to undergo colon resection for treatment of colon cancer. He requires interruption of anticoagulation. He was seen in consult by Dr. Carmon Ginsberg Mir, and is agreeable to under a retrievable IVC filter placement.  He was diagnosed with bilateral pulmonary artery embolism on 10/15/2022.  He was treated with Eliquis.  Repeat lower extremity Doppler on 11/06/2022 demonstrate acute right and age indeterminate left lower extremity DVTs.     He was undergoing evaluation for anemia and heme-positive stool.  Colonoscopy on 09/03/2022 showed a fungating partially obstructing mass of the descending colon consistent with tubulovillous adenoma with high-grade dysplasia.  Plan is ultimately for partial colectomy after IVC filter placement today.     Past Medical History:  Diagnosis Date   Aortic regurgitation    Aortic regurgitation    Aortic root enlargement (HCC)    Arthritis    both shoulders - RC- wear   Heart murmur    History of pulmonary embolism 04/24/2014   Provoked; occurred after AVR in 2016   HTN (hypertension)    Hx of echocardiogram    Echo 3/16: mild LVH, EF 40-45%, ant-septal AK, AVR with elevated velocity (3.3 m/s - slightly above expected value), mild LAE //  Echo 3/17: Mild LVH, EF 40%, diffuse HK, indeterminate diastolic function, bioprosthetic AVR well-functioning (mean gradient 9 mmHg), MAC, mild LAE, mildly reduced RVSF, mild RAE   Hypercholesteremia     Past Surgical History:  Procedure Laterality Date   AORTIC VALVE REPLACEMENT N/A 04/11/2014   Procedure: AORTIC VALVE REPLACEMENT  (AVR);  Surgeon: Alleen Borne, MD;  Location: [facility] OR;  Service: Open Heart Surgery;  Laterality: N/A;   CARDIAC CATHETERIZATION     LEFT AND RIGHT HEART CATHETERIZATION WITH CORONARY ANGIOGRAM N/A 02/22/2014   Procedure: LEFT AND RIGHT HEART CATHETERIZATION WITH CORONARY ANGIOGRAM;  Surgeon: Lesleigh Noe, MD;  Location: [facility] CATH LAB;  Service: Cardiovascular;  Laterality: N/A;   MULTIPLE TOOTH EXTRACTIONS     TEE WITHOUT CARDIOVERSION N/A 03/24/2014   Procedure: TRANSESOPHAGEAL ECHOCARDIOGRAM (TEE);  Surgeon: Lars Masson, MD;  Location: [facility] ENDOSCOPY;  Service: Cardiovascular;  Laterality: N/A;   TEE WITHOUT CARDIOVERSION N/A 04/11/2014   Procedure: TRANSESOPHAGEAL ECHOCARDIOGRAM (TEE);  Surgeon: Alleen Borne, MD;  Location: [facility] OR;  Service: Open Heart Surgery;  Laterality: N/A;    Allergies: Patient has no known allergies.  Medications: Prior to Admission medications   Medication Sig Start Date End Date Taking? Authorizing Provider  apixaban (ELIQUIS) 5 MG TABS tablet Take 1 tablet (5 mg total) by mouth 2 (two) times daily. 10/29/22  Yes Pollyann Samples, NP  atorvastatin (LIPITOR) 20 MG tablet Take 1 tablet (20 mg total) by mouth daily. Please schedule appointment for future refills. 3rd and final attempt. Thank you Patient taking differently: Take 20 mg by mouth at bedtime. 08/13/21  Yes Lyn Records, MD  lisinopril-hydrochlorothiazide (ZESTORETIC) 10-12.5 MG tablet Take 1 tablet by mouth daily.   Yes [provider]  metoprolol tartrate (LOPRESSOR) 25 MG tablet Take 1 tablet (25 mg total) by mouth 2 (two) times daily. 04/15/14  Yes  Barrett, Erin R, PA-C  Multiple Vitamins-Minerals (CENTRUM SILVER 50+MEN) TABS Take 1 tablet by mouth daily.   Yes [provider]  spironolactone (ALDACTONE) 25 MG tablet Take 1 tablet (25 mg total) by mouth daily. 04/17/22  Yes Meriam Sprague, MD  enoxaparin (LOVENOX) 80 MG/0.8ML injection Inject 0.8 mLs (80 mg total) into the  skin every 12 (twelve) hours. 11/11/22   Nahser, Deloris Ping, MD     Family History  Problem Relation Age of Onset   Hypertension Mother    Heart attack Father    Hypertension Daughter    Hypertension Sister    Hypertension Brother    Stroke Neg Hx     Social History   Socioeconomic History   Marital status: Widowed    Spouse name: Not on file   Number of children: Not on file   Years of education: Not on file   Highest education level: Not on file  Occupational History   Not on file  Tobacco Use   Smoking status: Former    Types: Cigars    Quit date: 05/29/2001    Years since quitting: 21.6   Smokeless tobacco: Never  Substance and Sexual Activity   Alcohol use: No    Alcohol/week: 0.0 standard drinks of alcohol   Drug use: No   Sexual activity: Not on file  Other Topics Concern   Not on file  Social History Narrative   Not on file   Social Determinants of Health   Financial Resource Strain: Not on file  Food Insecurity: No Food Insecurity (10/15/2022)   Hunger Vital Sign    Worried About Running Out of Food in the Last Year: Never true    Ran Out of Food in the Last Year: Never true  Transportation Needs: No Transportation Needs (10/15/2022)   PRAPARE - Administrator, Civil Service (Medical): No    Lack of Transportation (Non-Medical): No  Physical Activity: Not on file  Stress: Not on file  Social Connections: Not on file    Review of Systems: A 12 point ROS discussed and pertinent positives are indicated in the HPI above.  All other systems are negative.  Review of Systems  Constitutional:  Negative for activity change and fever.  Respiratory:  Positive for shortness of breath. Negative for chest tightness.   Cardiovascular:  Negative for chest pain.  Skin:  Negative for color change.  Psychiatric/Behavioral:  Negative for behavioral problems and confusion.     Vital Signs: BP 119/81   Pulse (!) 57   Temp 97.9 F (36.6 C) (Oral)   Resp 19    Ht 5\' 9"  (1.753 m)   Wt 185 lb (83.9 kg)   SpO2 94%   BMI 27.32 kg/m    Physical Exam Constitutional:      General: He is not in acute distress.    Appearance: Normal appearance.  HENT:     Mouth/Throat:     Mouth: Mucous membranes are moist.  Cardiovascular:     Rate and Rhythm: Normal rate. Rhythm irregular.     Heart sounds: Normal heart sounds. No murmur heard. Pulmonary:     Effort: No respiratory distress.     Breath sounds: Normal breath sounds. No wheezing.  Musculoskeletal:        General: Normal range of motion.  Skin:    General: Skin is warm and dry.  Neurological:     Mental Status: He is alert and oriented to person, place, and  time.  Psychiatric:        Mood and Affect: Mood normal.        Behavior: Behavior normal.        Thought Content: Thought content normal.        Judgment: Judgment normal.     Imaging: No results found.  Labs:  CBC: Recent Labs    10/16/22 1234 10/17/22 0953 10/18/22 0532 10/29/22 1400  WBC 6.7 6.1 5.9 5.0  HGB 11.4* 10.7* 10.9* 11.7*  HCT 35.8* 32.6* 33.3* 35.4*  PLT 147* 142* 147* 211    COAGS: No results for input(s): "INR", "APTT" in the last 8760 hours.  BMP: Recent Labs    10/14/22 2215 10/14/22 2325 10/15/22 0510 10/16/22 0315 10/17/22 0953 10/29/22 1400  NA 136  --   --  135 135 139  K 5.9*  --  3.6 4.4 3.8 4.8  CL 105  --   --  105 105 107  CO2 20*  --   --  17* 20* 26  GLUCOSE 92  --   --  103* 98 95  BUN 17  --   --  13 10 17   CALCIUM 9.0  --   --  8.6* 8.8* 9.5  CREATININE 1.49* 1.50*  --  1.46* 1.25* 1.41*  GFRNONAA 47*  --   --  48* 57* 50*    LIVER FUNCTION TESTS: Recent Labs    10/14/22 2215 10/29/22 1400  BILITOT 1.6* 0.4  AST 53* 16  ALT 24 14  ALKPHOS 100 95  PROT 7.9 7.7  ALBUMIN 4.2 4.1    TUMOR MARKERS: Recent Labs    10/29/22 1400  CEA 6.34*    Assessment and Plan:  Labs with HGB 11.4, K 5.9, creatinine 1.49, GFR 47, AST 53, bili 1.63  Patient is a 82 y.o.  male with history of pulmonary embolism and acute on chronic lower extremity DVT who is scheduled to undergo colon resection for treatment of colon cancer. He requires interruption of anticoagulation. He was seen in consult by Dr. Renne Crigler. He is agreeable to undergo placement of a retrievable IVC filter.  Risks and benefits discussed with the patient including, but not limited to bleeding, infection, contrast induced renal failure, filter fracture or migration which can lead to emergency surgery or even death, strut penetration with damage or irritation to adjacent structures and caval thrombosis.  All of the patient's questions were answered, patient is agreeable to proceed. Consent signed and in chart.  Thank you for this interesting consult.  I greatly enjoyed meeting Frederick Burnett and look forward to participating in their care.  A copy of this report was sent to the requesting provider on this date.  Electronically Signed: Sable Feil, PA-C 01/06/2023, 2:26 PM   I spent a total of  30 Minutes   in face to face in clinical consultation, greater than 50% of which was counseling/coordinating care for IVC filter placement

## 2023-01-07 NOTE — Patient Instructions (Signed)
DUE TO COVID-19 ONLY TWO VISITORS  (aged 82 and older)  ARE ALLOWED TO COME WITH YOU AND STAY IN THE WAITING ROOM ONLY DURING PRE OP AND PROCEDURE.   **NO VISITORS ARE ALLOWED IN THE SHORT STAY AREA OR RECOVERY ROOM!!**  IF YOU WILL BE ADMITTED INTO THE HOSPITAL YOU ARE ALLOWED ONLY FOUR SUPPORT PEOPLE DURING VISITATION HOURS ONLY (7 AM -8PM)   The support person(s) must pass our screening, gel in and out, and wear a mask at all times, including in the patient's room. Patients must also wear a mask when staff or their support person are in the room. Visitors GUEST BADGE MUST BE WORN VISIBLY  One adult visitor may remain with you overnight and MUST be in the room by 8 P.M.     Your procedure is scheduled on: 01/10/23   Report to [facility] Main Entrance    Report to admitting at : 8:15 AM   Call this number if you have problems the morning of surgery [phone]   Clear liquids starting the day before until : 7:30 AM DAY OF SURGERY. Drink plenty clear liquids the day before surgery.  Water Black Coffee (sugar ok, NO MILK/CREAM OR CREAMERS)  Tea (sugar ok, NO MILK/CREAM OR CREAMERS) regular and decaf                             Plain Jell-O (NO RED)                                           Fruit ices (not with fruit pulp, NO RED)                                     Popsicles (NO RED)                                                                  Juice: apple, WHITE grape, WHITE cranberry Sports drinks like Gatorade (NO RED)              Drink 2 Ensure drinks AT 10:00 PM the night before surgery.     The day of surgery:  Drink ONE (1) Pre-Surgery Clear Ensure at : 7:30 AM the morning of surgery. Drink in one sitting. Do not sip.  This drink was given to you during your hospital  pre-op appointment visit. Nothing else to drink after completing the  Pre-Surgery Clear Ensure or G2.          If you have questions, please contact your surgeon's office.  FOLLOW BOWEL PREP  AND ANY ADDITIONAL PRE OP INSTRUCTIONS YOU RECEIVED FROM YOUR SURGEON'S OFFICE!!!   Oral Hygiene is also important to reduce your risk of infection.                                    Remember - BRUSH YOUR TEETH THE MORNING OF SURGERY WITH YOUR REGULAR TOOTHPASTE  DENTURES WILL BE REMOVED  PRIOR TO SURGERY PLEASE DO NOT APPLY "Poly grip" OR ADHESIVES!!!   Do NOT smoke after Midnight   Take these medicines the morning of surgery with A SIP OF WATER: metoprolol.                              You may not have any metal on your body including hair pins, jewelry, and body piercing             Do not wear lotions, powders, perfumes/cologne, or deodorant              Men may shave face and neck.   Do not bring valuables to the hospital. [facility] IS NOT             RESPONSIBLE   FOR VALUABLES.   Contacts, glasses, or bridgework may not be worn into surgery.   Bring small overnight bag day of surgery.   DO NOT BRING YOUR HOME MEDICATIONS TO THE HOSPITAL. PHARMACY WILL DISPENSE MEDICATIONS LISTED ON YOUR MEDICATION LIST TO YOU DURING YOUR ADMISSION IN THE HOSPITAL!    Patients discharged on the day of surgery will not be allowed to drive home.  Someone NEEDS to stay with you for the first 24 hours after anesthesia.   Special Instructions: Bring a copy of your healthcare power of attorney and living will documents         the day of surgery if you haven't scanned them before.              Please read over the following fact sheets you were given: IF YOU HAVE QUESTIONS ABOUT YOUR PRE-OP INSTRUCTIONS PLEASE CALL [phone]    [facility] - Preparing for Surgery Before surgery, you can play an important role.  Because skin is not sterile, your skin needs to be as free of germs as possible.  You can reduce the number of germs on your skin by washing with CHG (chlorahexidine gluconate) soap before surgery.  CHG is an antiseptic cleaner which kills germs and bonds with the skin to continue  killing germs even after washing. Please DO NOT use if you have an allergy to CHG or antibacterial soaps.  If your skin becomes reddened/irritated stop using the CHG and inform your nurse when you arrive at Short Stay. Do not shave (including legs and underarms) for at least 48 hours prior to the first CHG shower.  You may shave your face/neck. Please follow these instructions carefully:  1.  Shower with CHG Soap the night before surgery and the  morning of Surgery.  2.  If you choose to wash your hair, wash your hair first as usual with your  normal  shampoo.  3.  After you shampoo, rinse your hair and body thoroughly to remove the  shampoo.                           4.  Use CHG as you would any other liquid soap.  You can apply chg directly  to the skin and wash                       Gently with a scrungie or clean washcloth.  5.  Apply the CHG Soap to your body ONLY FROM THE NECK DOWN.   Do not use on face/ open  Wound or open sores. Avoid contact with eyes, ears mouth and genitals (private parts).                       Wash face,  Genitals (private parts) with your normal soap.             6.  Wash thoroughly, paying special attention to the area where your surgery  will be performed.  7.  Thoroughly rinse your body with warm water from the neck down.  8.  DO NOT shower/wash with your normal soap after using and rinsing off  the CHG Soap.                9.  Pat yourself dry with a clean towel.            10.  Wear clean pajamas.            11.  Place clean sheets on your bed the night of your first shower and do not  sleep with pets. Day of Surgery : Do not apply any lotions/deodorants the morning of surgery.  Please wear clean clothes to the hospital/surgery center.  FAILURE TO FOLLOW THESE INSTRUCTIONS MAY RESULT IN THE CANCELLATION OF YOUR SURGERY PATIENT SIGNATURE_________________________________  NURSE  SIGNATURE__________________________________  ________________________________________________________________________

## 2023-01-08 ENCOUNTER — Encounter (HOSPITAL_COMMUNITY): Payer: Self-pay

## 2023-01-08 ENCOUNTER — Encounter (HOSPITAL_COMMUNITY)
Admission: RE | Admit: 2023-01-08 | Discharge: 2023-01-08 | Disposition: A | Discharge status: Home / Self Care | Payer: Medicare HMO | Source: Ambulatory Visit | Attending: General Surgery | Admitting: General Surgery

## 2023-01-08 ENCOUNTER — Other Ambulatory Visit: Payer: Self-pay

## 2023-01-08 ENCOUNTER — Ambulatory Visit (HOSPITAL_COMMUNITY): Payer: Medicare HMO

## 2023-01-08 VITALS — BP 124/79 | HR 55 | Temp 97.9°F | Ht 69.0 in | Wt 187.0 lb

## 2023-01-08 DIAGNOSIS — I10 Essential (primary) hypertension: Secondary | ICD-10-CM | POA: Insufficient documentation

## 2023-01-08 DIAGNOSIS — C189 Malignant neoplasm of colon, unspecified: Secondary | ICD-10-CM | POA: Insufficient documentation

## 2023-01-08 DIAGNOSIS — K6389 Other specified diseases of intestine: Secondary | ICD-10-CM | POA: Insufficient documentation

## 2023-01-08 DIAGNOSIS — Z01818 Encounter for other preprocedural examination: Secondary | ICD-10-CM | POA: Insufficient documentation

## 2023-01-08 LAB — CBC
HCT: 35.5 % — ABNORMAL LOW (ref 39.0–52.0)
Hemoglobin: 11.5 g/dL — ABNORMAL LOW (ref 13.0–17.0)
MCH: 28.9 pg (ref 26.0–34.0)
MCHC: 32.4 g/dL (ref 30.0–36.0)
MCV: 89.2 fL (ref 80.0–100.0)
Platelets: 138 10*3/uL — ABNORMAL LOW (ref 150–400)
RBC: 3.98 MIL/uL — ABNORMAL LOW (ref 4.22–5.81)
RDW: 13.3 % (ref 11.5–15.5)
WBC: 4.4 10*3/uL (ref 4.0–10.5)
nRBC: 0 % (ref 0.0–0.2)

## 2023-01-08 NOTE — Progress Notes (Signed)
For Anesthesia: PCP - Irven Coe, MD  Cardiologist - Nahser, Deloris Ping, MD   Bowel Prep reminder:  Chest x-ray -  EKG - 10/16/22 Stress Test -  ECHO - 10/15/22    TEE: 2016 Cardiac Cath - 02/22/14 Pacemaker/ICD device last checked: Pacemaker orders received: Device Rep notified:  Spinal Cord Stimulator:  Sleep Study -  CPAP -   Fasting Blood Sugar - N/A Checks Blood Sugar _____ times a day Date and result of last Hgb A1c-  Last dose of GLP1 agonist- N/A GLP1 instructions:   Last dose of SGLT-2 inhibitors- N/A SGLT-2 instructions:   Blood Thinner Instructions: Eliquis on hold since: 01/06/23. Lovenox last dose: 01/08/23. Aspirin Instructions: Last Dose:  Activity level: Can go up a flight of stairs and activities of daily living without stopping and without chest pain and/or shortness of breath   Able to exercise without chest pain and/or shortness of breath  Anesthesia review: Hx: PE,Murmur,HTN,IVC filter(01/06/23).  Patient denies shortness of breath, fever, cough and chest pain at PAT appointment   Patient verbalized understanding of instructions that were given to them at the PAT appointment. Patient was also instructed that they will need to review over the PAT instructions again at home before surgery.

## 2023-01-09 LAB — CEA: CEA: 10.5 ng/mL — ABNORMAL HIGH (ref 0.0–4.7)

## 2023-01-10 ENCOUNTER — Other Ambulatory Visit: Payer: Self-pay

## 2023-01-10 ENCOUNTER — Inpatient Hospital Stay (HOSPITAL_COMMUNITY): Payer: Medicare HMO | Admitting: Anesthesiology

## 2023-01-10 ENCOUNTER — Encounter (HOSPITAL_COMMUNITY)
Admission: RE | Disposition: A | Discharge status: Home / Self Care | Payer: Self-pay | Source: Home / Self Care | Attending: General Surgery

## 2023-01-10 ENCOUNTER — Encounter (HOSPITAL_COMMUNITY): Payer: Self-pay | Admitting: General Surgery

## 2023-01-10 ENCOUNTER — Inpatient Hospital Stay (HOSPITAL_COMMUNITY)
Admission: RE | Admit: 2023-01-10 | Discharge: 2023-01-12 | DRG: 330 | Disposition: A | Discharge status: Home / Self Care | Payer: Medicare HMO | Attending: General Surgery | Admitting: General Surgery

## 2023-01-10 DIAGNOSIS — M19012 Primary osteoarthritis, left shoulder: Secondary | ICD-10-CM | POA: Diagnosis present

## 2023-01-10 DIAGNOSIS — M19011 Primary osteoarthritis, right shoulder: Secondary | ICD-10-CM | POA: Diagnosis present

## 2023-01-10 DIAGNOSIS — I5032 Chronic diastolic (congestive) heart failure: Secondary | ICD-10-CM | POA: Diagnosis present

## 2023-01-10 DIAGNOSIS — C184 Malignant neoplasm of transverse colon: Principal | ICD-10-CM | POA: Diagnosis present

## 2023-01-10 DIAGNOSIS — I110 Hypertensive heart disease with heart failure: Secondary | ICD-10-CM | POA: Diagnosis present

## 2023-01-10 DIAGNOSIS — K6389 Other specified diseases of intestine: Secondary | ICD-10-CM | POA: Diagnosis present

## 2023-01-10 DIAGNOSIS — I2510 Atherosclerotic heart disease of native coronary artery without angina pectoris: Secondary | ICD-10-CM | POA: Diagnosis not present

## 2023-01-10 DIAGNOSIS — I77810 Thoracic aortic ectasia: Secondary | ICD-10-CM | POA: Diagnosis present

## 2023-01-10 DIAGNOSIS — K639 Disease of intestine, unspecified: Secondary | ICD-10-CM | POA: Diagnosis not present

## 2023-01-10 DIAGNOSIS — Z87891 Personal history of nicotine dependence: Secondary | ICD-10-CM | POA: Diagnosis not present

## 2023-01-10 DIAGNOSIS — Z86711 Personal history of pulmonary embolism: Secondary | ICD-10-CM | POA: Diagnosis present

## 2023-01-10 DIAGNOSIS — Z8249 Family history of ischemic heart disease and other diseases of the circulatory system: Secondary | ICD-10-CM | POA: Diagnosis not present

## 2023-01-10 DIAGNOSIS — R011 Cardiac murmur, unspecified: Secondary | ICD-10-CM | POA: Diagnosis present

## 2023-01-10 DIAGNOSIS — Z953 Presence of xenogenic heart valve: Secondary | ICD-10-CM

## 2023-01-10 DIAGNOSIS — Z79899 Other long term (current) drug therapy: Secondary | ICD-10-CM

## 2023-01-10 DIAGNOSIS — E7800 Pure hypercholesterolemia, unspecified: Secondary | ICD-10-CM | POA: Diagnosis present

## 2023-01-10 DIAGNOSIS — Z9889 Other specified postprocedural states: Secondary | ICD-10-CM

## 2023-01-10 DIAGNOSIS — Z95828 Presence of other vascular implants and grafts: Secondary | ICD-10-CM | POA: Diagnosis not present

## 2023-01-10 DIAGNOSIS — I351 Nonrheumatic aortic (valve) insufficiency: Secondary | ICD-10-CM | POA: Diagnosis present

## 2023-01-10 DIAGNOSIS — D124 Benign neoplasm of descending colon: Secondary | ICD-10-CM | POA: Diagnosis present

## 2023-01-10 DIAGNOSIS — Z7901 Long term (current) use of anticoagulants: Secondary | ICD-10-CM

## 2023-01-10 DIAGNOSIS — C186 Malignant neoplasm of descending colon: Secondary | ICD-10-CM | POA: Diagnosis not present

## 2023-01-10 DIAGNOSIS — Z01818 Encounter for other preprocedural examination: Secondary | ICD-10-CM

## 2023-01-10 DIAGNOSIS — E785 Hyperlipidemia, unspecified: Secondary | ICD-10-CM | POA: Diagnosis present

## 2023-01-10 DIAGNOSIS — C185 Malignant neoplasm of splenic flexure: Secondary | ICD-10-CM | POA: Diagnosis not present

## 2023-01-10 DIAGNOSIS — K635 Polyp of colon: Secondary | ICD-10-CM | POA: Diagnosis not present

## 2023-01-10 DIAGNOSIS — Z86718 Personal history of other venous thrombosis and embolism: Secondary | ICD-10-CM

## 2023-01-10 DIAGNOSIS — I10 Essential (primary) hypertension: Secondary | ICD-10-CM

## 2023-01-10 DIAGNOSIS — I428 Other cardiomyopathies: Secondary | ICD-10-CM | POA: Diagnosis present

## 2023-01-10 LAB — TYPE AND SCREEN
ABO/RH(D): AB POS
Antibody Screen: NEGATIVE

## 2023-01-10 SURGERY — COLECTOMY, PARTIAL, ROBOT-ASSISTED, LAPAROSCOPIC
Anesthesia: General

## 2023-01-10 MED ORDER — PHENYLEPHRINE 80 MCG/ML (10ML) SYRINGE FOR IV PUSH (FOR BLOOD PRESSURE SUPPORT)
PREFILLED_SYRINGE | INTRAVENOUS | Status: AC
  Filled 2023-01-10: qty 10

## 2023-01-10 MED ORDER — BUPIVACAINE-EPINEPHRINE 0.25% -1:200000 IJ SOLN
INTRAMUSCULAR | Status: AC
  Filled 2023-01-10: qty 1

## 2023-01-10 MED ORDER — KCL IN DEXTROSE-NACL 20-5-0.45 MEQ/L-%-% IV SOLN
INTRAVENOUS | Status: DC
Start: 2023-01-10 — End: 2023-01-11
  Filled 2023-01-10 (×2): qty 1000

## 2023-01-10 MED ORDER — ENSURE PRE-SURGERY PO LIQD: 296.0000 mL | Freq: Once | ORAL | Status: DC

## 2023-01-10 MED ORDER — LIDOCAINE HCL (PF) 2 % IJ SOLN
INTRAMUSCULAR | Status: AC
  Filled 2023-01-10: qty 35

## 2023-01-10 MED ORDER — LACTATED RINGERS IR SOLN
Status: DC | PRN
Start: 2023-01-10 — End: 2023-01-10
  Administered 2023-01-10: 1000 mL

## 2023-01-10 MED ORDER — SUGAMMADEX SODIUM 200 MG/2ML IV SOLN
INTRAVENOUS | Status: DC | PRN
  Administered 2023-01-10: 200 mg via INTRAVENOUS

## 2023-01-10 MED ORDER — LISINOPRIL-HYDROCHLOROTHIAZIDE 10-12.5 MG PO TABS: 1.0000 | ORAL_TABLET | Freq: Every day | ORAL | Status: DC

## 2023-01-10 MED ORDER — ONDANSETRON HCL 4 MG/2ML IJ SOLN: 4.0000 mg | Freq: Four times a day (QID) | INTRAMUSCULAR | Status: DC | PRN

## 2023-01-10 MED ORDER — LISINOPRIL 10 MG PO TABS
10.0000 mg | ORAL_TABLET | Freq: Every day | ORAL | Status: DC
  Administered 2023-01-11 – 2023-01-12 (×2): 10 mg via ORAL
  Filled 2023-01-10 (×2): qty 1

## 2023-01-10 MED ORDER — ENSURE SURGERY PO LIQD
237.0000 mL | Freq: Two times a day (BID) | ORAL | Status: DC
Start: 2023-01-11 — End: 2023-01-12
  Administered 2023-01-11 (×2): 237 mL via ORAL

## 2023-01-10 MED ORDER — BISACODYL 5 MG PO TBEC: 20.0000 mg | DELAYED_RELEASE_TABLET | Freq: Once | ORAL | Status: DC

## 2023-01-10 MED ORDER — FENTANYL CITRATE (PF) 100 MCG/2ML IJ SOLN
INTRAMUSCULAR | Status: DC | PRN
  Administered 2023-01-10: 100 ug via INTRAVENOUS
  Administered 2023-01-10: 50 ug via INTRAVENOUS

## 2023-01-10 MED ORDER — PHENYLEPHRINE 80 MCG/ML (10ML) SYRINGE FOR IV PUSH (FOR BLOOD PRESSURE SUPPORT)
PREFILLED_SYRINGE | INTRAVENOUS | Status: DC | PRN
  Administered 2023-01-10 (×2): 80 ug via INTRAVENOUS

## 2023-01-10 MED ORDER — PHENYLEPHRINE HCL-NACL 20-0.9 MG/250ML-% IV SOLN
INTRAVENOUS | Status: DC | PRN
  Administered 2023-01-10: 25 ug/min via INTRAVENOUS

## 2023-01-10 MED ORDER — ACETAMINOPHEN 500 MG PO TABS
1000.0000 mg | ORAL_TABLET | ORAL | Status: AC
  Administered 2023-01-10: 1000 mg via ORAL
  Filled 2023-01-10: qty 2

## 2023-01-10 MED ORDER — DIPHENHYDRAMINE HCL 12.5 MG/5ML PO ELIX: 12.5000 mg | ORAL_SOLUTION | Freq: Four times a day (QID) | ORAL | Status: DC | PRN

## 2023-01-10 MED ORDER — PHENYLEPHRINE HCL (PRESSORS) 10 MG/ML IV SOLN
INTRAVENOUS | Status: AC
  Filled 2023-01-10: qty 1

## 2023-01-10 MED ORDER — ORAL CARE MOUTH RINSE: 15.0000 mL | Freq: Once | OROMUCOSAL | Status: AC

## 2023-01-10 MED ORDER — BUPIVACAINE LIPOSOME 1.3 % IJ SUSP: 20.0000 mL | Freq: Once | INTRAMUSCULAR | Status: DC

## 2023-01-10 MED ORDER — DEXAMETHASONE SODIUM PHOSPHATE 10 MG/ML IJ SOLN
INTRAMUSCULAR | Status: AC
  Filled 2023-01-10: qty 1

## 2023-01-10 MED ORDER — DEXAMETHASONE SODIUM PHOSPHATE 4 MG/ML IJ SOLN
INTRAMUSCULAR | Status: DC | PRN
  Administered 2023-01-10: 5 mg via INTRAVENOUS

## 2023-01-10 MED ORDER — LIDOCAINE HCL (CARDIAC) PF 100 MG/5ML IV SOSY
PREFILLED_SYRINGE | INTRAVENOUS | Status: DC | PRN
  Administered 2023-01-10: 80 mg via INTRAVENOUS

## 2023-01-10 MED ORDER — ROCURONIUM BROMIDE 100 MG/10ML IV SOLN
INTRAVENOUS | Status: DC | PRN
  Administered 2023-01-10: 20 mg via INTRAVENOUS
  Administered 2023-01-10: 50 mg via INTRAVENOUS

## 2023-01-10 MED ORDER — SIMETHICONE 80 MG PO CHEW: 40.0000 mg | CHEWABLE_TABLET | Freq: Four times a day (QID) | ORAL | Status: DC | PRN

## 2023-01-10 MED ORDER — INDOCYANINE GREEN 25 MG IV SOLR
INTRAVENOUS | Status: DC | PRN
  Administered 2023-01-10: 2.5 mg via INTRAVENOUS

## 2023-01-10 MED ORDER — DIPHENHYDRAMINE HCL 50 MG/ML IJ SOLN: 12.5000 mg | Freq: Four times a day (QID) | INTRAMUSCULAR | Status: DC | PRN

## 2023-01-10 MED ORDER — ALUM & MAG HYDROXIDE-SIMETH 200-200-20 MG/5ML PO SUSP: 30.0000 mL | Freq: Four times a day (QID) | ORAL | Status: DC | PRN

## 2023-01-10 MED ORDER — ACETAMINOPHEN 500 MG PO TABS
1000.0000 mg | ORAL_TABLET | Freq: Four times a day (QID) | ORAL | Status: DC
  Administered 2023-01-10 – 2023-01-12 (×7): 1000 mg via ORAL
  Filled 2023-01-10 (×7): qty 2

## 2023-01-10 MED ORDER — ATORVASTATIN CALCIUM 20 MG PO TABS
20.0000 mg | ORAL_TABLET | Freq: Every day | ORAL | Status: DC
Start: 2023-01-10 — End: 2023-01-12
  Administered 2023-01-10 – 2023-01-11 (×2): 20 mg via ORAL
  Filled 2023-01-10 (×2): qty 1

## 2023-01-10 MED ORDER — ENOXAPARIN SODIUM 40 MG/0.4ML IJ SOSY
40.0000 mg | PREFILLED_SYRINGE | INTRAMUSCULAR | Status: DC
  Administered 2023-01-11 – 2023-01-12 (×2): 40 mg via SUBCUTANEOUS
  Filled 2023-01-10 (×2): qty 0.4

## 2023-01-10 MED ORDER — 0.9 % SODIUM CHLORIDE (POUR BTL) OPTIME
TOPICAL | Status: DC | PRN
  Administered 2023-01-10: 1000 mL

## 2023-01-10 MED ORDER — ONDANSETRON HCL 4 MG PO TABS: 4.0000 mg | ORAL_TABLET | Freq: Four times a day (QID) | ORAL | Status: DC | PRN

## 2023-01-10 MED ORDER — LACTATED RINGERS IV SOLN: INTRAVENOUS | Status: DC | PRN

## 2023-01-10 MED ORDER — ENSURE PRE-SURGERY PO LIQD: 592.0000 mL | Freq: Once | ORAL | Status: DC

## 2023-01-10 MED ORDER — SACCHAROMYCES BOULARDII 250 MG PO CAPS
250.0000 mg | ORAL_CAPSULE | Freq: Two times a day (BID) | ORAL | Status: DC
  Administered 2023-01-10 – 2023-01-12 (×4): 250 mg via ORAL
  Filled 2023-01-10 (×4): qty 1

## 2023-01-10 MED ORDER — METOPROLOL TARTRATE 25 MG PO TABS
25.0000 mg | ORAL_TABLET | Freq: Two times a day (BID) | ORAL | Status: DC
  Administered 2023-01-10 – 2023-01-12 (×4): 25 mg via ORAL
  Filled 2023-01-10 (×4): qty 1

## 2023-01-10 MED ORDER — FENTANYL CITRATE PF 50 MCG/ML IJ SOSY: 25.0000 ug | PREFILLED_SYRINGE | INTRAMUSCULAR | Status: DC | PRN

## 2023-01-10 MED ORDER — PROPOFOL 10 MG/ML IV BOLUS
INTRAVENOUS | Status: DC | PRN
  Administered 2023-01-10: 150 mg via INTRAVENOUS

## 2023-01-10 MED ORDER — ALVIMOPAN 12 MG PO CAPS
12.0000 mg | ORAL_CAPSULE | ORAL | Status: AC
  Administered 2023-01-10: 12 mg via ORAL
  Filled 2023-01-10: qty 1

## 2023-01-10 MED ORDER — LIDOCAINE 2% (20 MG/ML) 5 ML SYRINGE
INTRAMUSCULAR | Status: DC | PRN
  Administered 2023-01-10: 1.5 mg/kg/h via INTRAVENOUS

## 2023-01-10 MED ORDER — STERILE WATER FOR IRRIGATION IR SOLN
Status: DC | PRN
  Administered 2023-01-10: 1000 mL

## 2023-01-10 MED ORDER — TRAMADOL HCL 50 MG PO TABS: 50.0000 mg | ORAL_TABLET | Freq: Four times a day (QID) | ORAL | Status: DC | PRN

## 2023-01-10 MED ORDER — FENTANYL CITRATE (PF) 100 MCG/2ML IJ SOLN
INTRAMUSCULAR | Status: AC
  Filled 2023-01-10: qty 2

## 2023-01-10 MED ORDER — SODIUM CHLORIDE 0.9 % IV SOLN
2.0000 g | INTRAVENOUS | Status: AC
  Administered 2023-01-10: 2 g via INTRAVENOUS
  Filled 2023-01-10: qty 2

## 2023-01-10 MED ORDER — ONDANSETRON HCL 4 MG/2ML IJ SOLN
INTRAMUSCULAR | Status: DC | PRN
  Administered 2023-01-10: 4 mg via INTRAVENOUS

## 2023-01-10 MED ORDER — ONDANSETRON HCL 4 MG/2ML IJ SOLN
INTRAMUSCULAR | Status: AC
  Filled 2023-01-10: qty 2

## 2023-01-10 MED ORDER — ALVIMOPAN 12 MG PO CAPS
12.0000 mg | ORAL_CAPSULE | Freq: Two times a day (BID) | ORAL | Status: DC
  Administered 2023-01-11 – 2023-01-12 (×3): 12 mg via ORAL
  Filled 2023-01-10 (×3): qty 1

## 2023-01-10 MED ORDER — POLYETHYLENE GLYCOL 3350 17 GM/SCOOP PO POWD: 1.0000 | Freq: Once | ORAL | Status: DC

## 2023-01-10 MED ORDER — CHLORHEXIDINE GLUCONATE 0.12 % MT SOLN
15.0000 mL | Freq: Once | OROMUCOSAL | Status: AC
  Administered 2023-01-10: 15 mL via OROMUCOSAL

## 2023-01-10 MED ORDER — OXYCODONE HCL 5 MG/5ML PO SOLN: 5.0000 mg | Freq: Once | ORAL | Status: DC | PRN

## 2023-01-10 MED ORDER — BUPIVACAINE LIPOSOME 1.3 % IJ SUSP
INTRAMUSCULAR | Status: AC
  Filled 2023-01-10: qty 20

## 2023-01-10 MED ORDER — SPIRONOLACTONE 25 MG PO TABS
25.0000 mg | ORAL_TABLET | Freq: Every day | ORAL | Status: DC
  Administered 2023-01-11 – 2023-01-12 (×2): 25 mg via ORAL
  Filled 2023-01-10 (×2): qty 1

## 2023-01-10 MED ORDER — PROPOFOL 10 MG/ML IV BOLUS
INTRAVENOUS | Status: AC
  Filled 2023-01-10: qty 20

## 2023-01-10 MED ORDER — HYDROCHLOROTHIAZIDE 12.5 MG PO TABS
12.5000 mg | ORAL_TABLET | Freq: Every day | ORAL | Status: DC
  Administered 2023-01-11 – 2023-01-12 (×2): 12.5 mg via ORAL
  Filled 2023-01-10 (×2): qty 1

## 2023-01-10 MED ORDER — SODIUM CHLORIDE 0.9 % IV SOLN
2.0000 g | Freq: Two times a day (BID) | INTRAVENOUS | Status: AC
  Administered 2023-01-10: 2 g via INTRAVENOUS
  Filled 2023-01-10: qty 2

## 2023-01-10 MED ORDER — HYDROMORPHONE HCL 1 MG/ML IJ SOLN: 0.5000 mg | INTRAMUSCULAR | Status: DC | PRN

## 2023-01-10 MED ORDER — GABAPENTIN 100 MG PO CAPS
300.0000 mg | ORAL_CAPSULE | Freq: Two times a day (BID) | ORAL | Status: DC
  Administered 2023-01-10 – 2023-01-12 (×4): 300 mg via ORAL
  Filled 2023-01-10 (×4): qty 3

## 2023-01-10 MED ORDER — GABAPENTIN 300 MG PO CAPS
300.0000 mg | ORAL_CAPSULE | ORAL | Status: AC
  Administered 2023-01-10: 300 mg via ORAL
  Filled 2023-01-10: qty 1

## 2023-01-10 MED ORDER — OXYCODONE HCL 5 MG PO TABS: 5.0000 mg | ORAL_TABLET | Freq: Once | ORAL | Status: DC | PRN

## 2023-01-10 MED ORDER — LACTATED RINGERS IV SOLN: INTRAVENOUS | Status: DC

## 2023-01-10 MED ORDER — BUPIVACAINE-EPINEPHRINE (PF) 0.25% -1:200000 IJ SOLN
INTRAMUSCULAR | Status: DC | PRN
  Administered 2023-01-10: 50 mL

## 2023-01-10 SURGICAL SUPPLY — 85 items
BAG COUNTER SPONGE SURGICOUNT (BAG) ×1 IMPLANT
BLADE EXTENDED COATED 6.5IN (ELECTRODE) IMPLANT
CANNULA REDUCER 12-8 DVNC XI (CANNULA) IMPLANT
CAUTERY HOOK MNPLR 1.6 DVNC XI (INSTRUMENTS) IMPLANT
COVER SURGICAL LIGHT HANDLE (MISCELLANEOUS) ×2 IMPLANT
COVER TIP SHEARS 8 DVNC (MISCELLANEOUS) ×1 IMPLANT
DRAIN CHANNEL 19F RND (DRAIN) IMPLANT
DRAPE ARM DVNC X/XI (DISPOSABLE) ×4 IMPLANT
DRAPE COLUMN DVNC XI (DISPOSABLE) ×1 IMPLANT
DRAPE SURG IRRIG POUCH 19X23 (DRAPES) ×1 IMPLANT
DRIVER NDL LRG 8 DVNC XI (INSTRUMENTS) ×1 IMPLANT
DRIVER NDLE LRG 8 DVNC XI (INSTRUMENTS) ×1
DRSG OPSITE POSTOP 4X10 (GAUZE/BANDAGES/DRESSINGS) IMPLANT
DRSG OPSITE POSTOP 4X6 (GAUZE/BANDAGES/DRESSINGS) IMPLANT
DRSG OPSITE POSTOP 4X8 (GAUZE/BANDAGES/DRESSINGS) IMPLANT
ELECT PENCIL ROCKER SW 15FT (MISCELLANEOUS) ×1 IMPLANT
ELECT REM PT RETURN 15FT ADLT (MISCELLANEOUS) ×1 IMPLANT
ENDOLOOP SUT PDS II 0 18 (SUTURE) IMPLANT
EVACUATOR SILICONE 100CC (DRAIN) IMPLANT
FORCEPS BPLR FENES DVNC XI (FORCEP) IMPLANT
GLOVE BIO SURGEON STRL SZ 6.5 (GLOVE) ×3 IMPLANT
GLOVE INDICATOR 6.5 STRL GRN (GLOVE) ×3 IMPLANT
GOWN SRG XL LVL 4 BRTHBL STRL (GOWNS) ×1 IMPLANT
GOWN STRL NON-REIN XL LVL4 (GOWNS) ×1
GOWN STRL REUS W/ TWL XL LVL3 (GOWN DISPOSABLE) ×3 IMPLANT
GOWN STRL REUS W/TWL XL LVL3 (GOWN DISPOSABLE) ×3
GRASPER SUT TROCAR 14GX15 (MISCELLANEOUS) IMPLANT
GRASPER TIP-UP FEN DVNC XI (INSTRUMENTS) ×1 IMPLANT
HOLDER FOLEY CATH W/STRAP (MISCELLANEOUS) ×1 IMPLANT
IRRIG SUCT STRYKERFLOW 2 WTIP (MISCELLANEOUS) ×1
IRRIGATION SUCT STRKRFLW 2 WTP (MISCELLANEOUS) ×1 IMPLANT
KIT PROCEDURE DVNC SI (MISCELLANEOUS) IMPLANT
KIT TURNOVER KIT A (KITS) IMPLANT
NDL INSUFFLATION 14GA 120MM (NEEDLE) ×1 IMPLANT
NEEDLE INSUFFLATION 14GA 120MM (NEEDLE) ×1
PACK CARDIOVASCULAR III (CUSTOM PROCEDURE TRAY) ×1 IMPLANT
PACK COLON (CUSTOM PROCEDURE TRAY) ×1 IMPLANT
PAD POSITIONING PINK XL (MISCELLANEOUS) ×1 IMPLANT
RELOAD STAPLE 60 2.5 WHT DVNC (STAPLE) IMPLANT
RELOAD STAPLE 60 3.5 BLU DVNC (STAPLE) IMPLANT
RELOAD STAPLE 60 4.3 GRN DVNC (STAPLE) IMPLANT
RETRACTOR WND ALEXIS 18 MED (MISCELLANEOUS) IMPLANT
RTRCTR WOUND ALEXIS 18CM MED (MISCELLANEOUS)
SCISSORS LAP 5X35 DISP (ENDOMECHANICALS) IMPLANT
SCISSORS MNPLR CVD DVNC XI (INSTRUMENTS) ×1 IMPLANT
SEAL UNIV 5-12 XI (MISCELLANEOUS) ×3 IMPLANT
SEALER VESSEL EXT DVNC XI (MISCELLANEOUS) ×1 IMPLANT
SOL ELECTROSURG ANTI STICK (MISCELLANEOUS) ×1
SOLUTION ELECTROSURG ANTI STCK (MISCELLANEOUS) ×1 IMPLANT
SPIKE FLUID TRANSFER (MISCELLANEOUS) IMPLANT
STAPLER 60 SUREFORM DVNC (STAPLE) IMPLANT
STAPLER ECHELON POWER CIR 29 (STAPLE) IMPLANT
STAPLER ECHELON POWER CIR 31 (STAPLE) IMPLANT
STAPLER RELOAD 2.5X60 WHT DVNC (STAPLE) ×1
STAPLER RELOAD 3.5X60 BLU DVNC (STAPLE) ×2
STAPLER RELOAD 4.3X60 GRN DVNC (STAPLE)
STOPCOCK 4 WAY LG BORE MALE ST (IV SETS) ×2 IMPLANT
SUT ETHILON 2 0 PS N (SUTURE) IMPLANT
SUT NOVA NAB GS-21 1 T12 (SUTURE) ×2 IMPLANT
SUT PROLENE 2 0 KS (SUTURE) IMPLANT
SUT SILK 2 0 (SUTURE) ×1
SUT SILK 2 0 SH CR/8 (SUTURE) IMPLANT
SUT SILK 2-0 18XBRD TIE 12 (SUTURE) ×1 IMPLANT
SUT SILK 3 0 (SUTURE)
SUT SILK 3 0 SH CR/8 (SUTURE) ×1 IMPLANT
SUT SILK 3-0 18XBRD TIE 12 (SUTURE) IMPLANT
SUT V-LOC BARB 180 2/0GR6 GS22 (SUTURE) ×2
SUT VIC AB 2-0 SH 18 (SUTURE) IMPLANT
SUT VIC AB 2-0 SH 27 (SUTURE)
SUT VIC AB 2-0 SH 27X BRD (SUTURE) IMPLANT
SUT VIC AB 3-0 SH 18 (SUTURE) IMPLANT
SUT VIC AB 4-0 PS2 27 (SUTURE) ×2 IMPLANT
SUT VICRYL 0 UR6 27IN ABS (SUTURE) ×1 IMPLANT
SUTURE V-LC BRB 180 2/0GR6GS22 (SUTURE) IMPLANT
SYR 20ML ECCENTRIC (SYRINGE) ×1 IMPLANT
SYS LAPSCP GELPORT 120MM (MISCELLANEOUS)
SYS WOUND ALEXIS 18CM MED (MISCELLANEOUS)
SYSTEM LAPSCP GELPORT 120MM (MISCELLANEOUS) IMPLANT
SYSTEM WOUND ALEXIS 18CM MED (MISCELLANEOUS) IMPLANT
TOWEL OR 17X26 10 PK STRL BLUE (TOWEL DISPOSABLE) IMPLANT
TOWEL OR NON WOVEN STRL DISP B (DISPOSABLE) ×1 IMPLANT
TRAY FOLEY MTR SLVR 16FR STAT (SET/KITS/TRAYS/PACK) ×1 IMPLANT
TROCAR ADV FIXATION 5X100MM (TROCAR) ×1 IMPLANT
TUBING CONNECTING 10 (TUBING) ×2 IMPLANT
TUBING INSUFFLATION 10FT LAP (TUBING) ×1 IMPLANT

## 2023-01-10 NOTE — Discharge Instructions (Signed)
ABDOMINAL SURGERY: POST OP INSTRUCTIONS  DIET: Follow a light bland diet the first 24 hours after arrival home, such as soup, liquids, crackers, etc.  Be sure to include lots of fluids daily.  Avoid fast food or heavy meals as your are more likely to get nauseated.  Do not eat any uncooked fruits or vegetables for the next 2 weeks as your colon heals. Take your usually prescribed home medications unless otherwise directed. PAIN CONTROL: Pain is best controlled by a usual combination of three different methods TOGETHER: Ice/Heat Over the counter pain medication Prescription pain medication Most patients will experience some swelling and bruising around the incisions.  Ice packs or heating pads (30-60 minutes up to 6 times a day) will help. Use ice for the first few days to help decrease swelling and bruising, then switch to heat to help relax tight/sore spots and speed recovery.  Some people prefer to use ice alone, heat alone, alternating between ice & heat.  Experiment to what works for you.  Swelling and bruising can take several weeks to resolve.   It is helpful to take an over-the-counter pain medication regularly for the first few weeks.  Choose one of the following that works best for you: Naproxen (Aleve, etc)  Two 220mg  tabs twice a day Ibuprofen (Advil, etc) Three 200mg  tabs four times a day (every meal & bedtime) Acetaminophen (Tylenol, etc) 500-650mg  four times a day (every meal & bedtime) A  prescription for pain medication (such as oxycodone, hydrocodone, etc) should be given to you upon discharge.  Take your pain medication as prescribed.  If you are having problems/concerns with the prescription medicine (does not control pain, nausea, vomiting, rash, itching, etc), please call us [phone] to see if we need to switch you to a different pain medicine that will work better for you and/or control your side effect better. If you need a refill on your pain medication, please contact  your pharmacy.  They will contact our office to request authorization. Prescriptions will not be filled after 5 pm or on week-ends. Avoid getting constipated.  Between the surgery and the pain medications, it is common to experience some constipation.  Increasing fluid intake and taking a fiber supplement (such as Metamucil, Citrucel, FiberCon, MiraLax, etc) 1-2 times a day regularly will usually help prevent this problem from occurring.  A mild laxative (prune juice, Milk of Magnesia, MiraLax, etc) should be taken according to package directions if there are no bowel movements after 48 hours.   Watch out for diarrhea.  If you have many loose bowel movements, simplify your diet to bland foods & liquids for a few days.  Stop any stool softeners and decrease your fiber supplement.  Switching to mild anti-diarrheal medications (Kayopectate, Pepto Bismol) can help.  If this worsens or does not improve, please call us. Wash / shower every day.  You may shower over the incision / wound.  Avoid baths until the skin is fully healed.  Continue to shower over incision(s) after the dressing is off. Remove your waterproof bandages 5 days after surgery.  You may leave the incision open to air.  You may replace a dressing/Band-Aid to cover the incision for comfort if you wish. ACTIVITIES as tolerated:   You may resume regular (light) daily activities beginning the next day--such as daily self-care, walking, climbing stairs--gradually increasing activities as tolerated.  If you can walk 30 minutes without difficulty, it is safe to try more intense activity such as jogging,  treadmill, bicycling, low-impact aerobics, swimming, etc. Save the most intensive and strenuous activity for last such as sit-ups, heavy lifting, contact sports, etc  Refrain from any heavy lifting or straining until you are off narcotics for pain control.   DO NOT PUSH THROUGH PAIN.  Let pain be your guide: If it hurts to do something, don't do it.   Pain is your body warning you to avoid that activity for another week until the pain goes down. You may drive when you are no longer taking prescription pain medication, you can comfortably wear a seatbelt, and you can safely maneuver your car and apply brakes. You may have sexual intercourse when it is comfortable.  FOLLOW UP in our office Please call [facility] at [phone] to set up an appointment to see your surgeon in the office for a follow-up appointment approximately 1-2 weeks after your surgery. Make sure that you call for this appointment the day you arrive home to insure a convenient appointment time. 10. IF YOU HAVE DISABILITY OR FAMILY LEAVE FORMS, BRING THEM TO THE OFFICE FOR PROCESSING.  DO NOT GIVE THEM TO YOUR DOCTOR.   WHEN TO CALL us [phone]: Poor pain control Reactions / problems with new medications (rash/itching, nausea, etc)  Fever over 101.5 F (38.5 C) Inability to urinate Nausea and/or vomiting Worsening swelling or bruising Continued bleeding from incision. Increased pain, redness, or drainage from the incision  The clinic staff is available to answer your questions during regular business hours (8:30am-5pm).  Please don't hesitate to call and ask to speak to one of our nurses for clinical concerns.   A surgeon from [facility] is always on call at the hospitals   If you have a medical emergency, go to the nearest emergency room or call 911.    [facility] [address], [place], [place]  [address] ? MAIN: [phone] ? TOLL FREE: [phone] ? FAX [phone] [email or web address]

## 2023-01-10 NOTE — Transfer of Care (Signed)
Immediate Anesthesia Transfer of Care Note  Patient: Frederick Burnett  Procedure(s) Performed: XI ROBOT ASSISTED LAPAROSCOPIC PARTIAL COLECTOMY AND SPLENIC FLEXURE MOBILIZATION  Patient Location: PACU  Anesthesia Type:General  Level of Consciousness: awake and alert   Airway & Oxygen Therapy: Patient Spontanous Breathing and Patient connected to nasal cannula oxygen  Post-op Assessment: Report given to RN and Post -op Vital signs reviewed and stable  Post vital signs: Reviewed and stable  Last Vitals:  Vitals Value Taken Time  BP 148/79 01/10/23 1440  Temp    Pulse 64 01/10/23 1442  Resp 16 01/10/23 1442  SpO2 100 % 01/10/23 1442  Vitals shown include unfiled device data.  Last Pain:  Vitals:   01/10/23 0855  TempSrc: Oral  PainSc:          Complications: No notable events documented.

## 2023-01-10 NOTE — H&P (Addendum)
REFERRING PHYSICIAN:  Benita Stabile, MD   PROVIDER:  Elenora Gamma, MD   MRN: [id number] DOB: Apr 18, 1940    Subjective    Chief Complaint: New Consultation       History of Present Illness:  .  82 year old male with cardiac disease who presented to the office after being evaluated for anemia and heme positive stool.  He underwent a colonoscopy with Dr. Bosie Clos and a descending colon mass was found.  Area was tattooed. 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  He lives an active lifestyle and gardens on a daily basis.  He has no history of abdominal surgery.     Review of Systems: A complete review of systems was obtained from the patient.  I have reviewed this information and discussed as appropriate with the patient.  See HPI as well for other ROS.     Medical History:     Past Medical History:  Diagnosis Date   CAD (coronary artery disease)     CHF (congestive heart failure) (CMS/HHS-HCC)     Hypertension        There is no problem list on file for this patient.          Past Surgical History:  Procedure Laterality Date   VASCULAR SURGERY          No Known Allergies         Current Outpatient Medications on File Prior to Visit  Medication Sig Dispense Refill   amLODIPine (NORVASC) 5 MG tablet Take 5 mg by mouth once daily       atorvastatin (LIPITOR) 20 MG tablet Take 20 mg by mouth once daily       GENTLE LAXATIVE, BISACODYL, 5 mg EC tablet as directed       lisinopriL-hydroCHLOROthiazide (ZESTORETIC) 10-12.5 mg tablet         metoprolol tartrate (LOPRESSOR) 25 MG tablet take 1 tablet by mouth twice a day with food for 90 days       spironolactone (ALDACTONE) 25 MG tablet Take 25 mg by mouth once daily        No current facility-administered medications on file prior to visit.           Family History  Problem Relation Age of Onset   Obesity Brother     Coronary Artery Disease (Blocked arteries  around heart) Brother        Social History       Tobacco Use  Smoking Status Never  Smokeless Tobacco Never      Social History        Socioeconomic History   Marital status: Widowed  Tobacco Use   Smoking status: Never   Smokeless tobacco: Never  Substance and Sexual Activity   Alcohol use: Never   Drug use: Never      Objective:      Vitals:   01/10/23 0855  BP: 107/77  Pulse: 86  Resp: 17  Temp: 98.3 F (36.8 C)  SpO2: 100%      Exam Gen: NAD CV: RRR Pulm: CTA Abd: soft       Labs, Imaging and Diagnostic Testing:     Assessment and Plan:  Diagnoses and all orders for this visit:   Adenomatous polyp of descending colon     CT Chest, Abdomen and Pelvis obtained to evaluate for surgical planning.  This showed a PE.  He was treated for this and IVC filter placed on 11/11  for surgery.  I recommend proceeding with a robotic assisted partial colectomy.  We will remove the tumor and associated lymph nodes. The surgery and anatomy were described to the patient as well as the risks of surgery and the possible complications.  These include: Bleeding, deep abdominal infections and possible wound complications such as hernia and infection, damage to adjacent structures, leak of surgical connections, which can lead to other surgeries and possibly an ostomy, possible need for other procedures, such as abscess drains in radiology, possible prolonged hospital stay, possible diarrhea from removal of part of the colon, possible constipation from narcotics, possible bowel, bladder or sexual dysfunction if having rectal surgery, prolonged fatigue/weakness or appetite loss, possible early recurrence of of disease, possible complications of their medical problems such as heart disease or arrhythmias or lung problems, death (less than 1%). I believe the patient understands and wishes to proceed with the surgery.    Vanita Panda, MD Colon and Rectal Surgery [facility]  Surgery

## 2023-01-10 NOTE — Anesthesia Procedure Notes (Signed)
Procedure Name: Intubation Date/Time: 01/10/2023 11:58 AM  Performed by: Deri Fuelling, CRNAPre-anesthesia Checklist: Patient identified, Emergency Drugs available, Suction available and Patient being monitored Patient Re-evaluated:Patient Re-evaluated prior to induction Oxygen Delivery Method: Circle system utilized Preoxygenation: Pre-oxygenation with 100% oxygen Induction Type: IV induction Ventilation: Mask ventilation without difficulty Laryngoscope Size: Mac and 4 Grade View: Grade I Tube type: Oral Tube size: 7.5 mm Number of attempts: 1 Airway Equipment and Method: Stylet and Oral airway Placement Confirmation: ETT inserted through vocal cords under direct vision, positive ETCO2 and breath sounds checked- equal and bilateral Secured at: 22 cm Tube secured with: Tape Dental Injury: Teeth and Oropharynx as per pre-operative assessment

## 2023-01-10 NOTE — Op Note (Signed)
01/10/2023  2:26 PM  PATIENT:  Frederick Burnett  82 y.o. male  Patient Care Team: Irven Coe, MD as PCP - General (Family Medicine) Nahser, Deloris Ping, MD as PCP - Cardiology (Cardiology) Malachy Mood, MD as Consulting Physician (Hematology and Oncology)  PRE-OPERATIVE DIAGNOSIS:  COLON MASS  POST-OPERATIVE DIAGNOSIS:  COLON MASS  PROCEDURE:  XI ROBOT ASSISTED PARTIAL COLECTOMY with SPLENIC FLEXURE MOBILIZATION   Surgeon(s): Romie Levee, MD Andria Meuse, MD  ASSISTANT: Dr Cliffton Asters   ANESTHESIA:   local and general  EBL: 20 ml Total I/O In: 100 [IV Piggyback:100] Out: 320 [Urine:300; Blood:20]  Delay start of Pharmacological VTE agent (>24hrs) due to surgical blood loss or risk of bleeding:  no  DRAINS: none   SPECIMEN:  Source of Specimen:  Splenic flexure  DISPOSITION OF SPECIMEN:  PATHOLOGY  COUNTS:  YES  PLAN OF CARE: Admit to inpatient   PATIENT DISPOSITION:  PACU - hemodynamically stable.  INDICATION:    82 y.o. M with colon mass concerning for adenocarcinoma.  I recommended segmental resection:  The anatomy & physiology of the digestive tract was discussed.  The pathophysiology was discussed.  Natural history risks without surgery was discussed.   I worked to give an overview of the disease and the frequent need to have multispecialty involvement.  I feel the risks of no intervention will lead to serious problems that outweigh the operative risks; therefore, I recommended a partial colectomy to remove the pathology.  Laparoscopic & open techniques were discussed.   Risks such as bleeding, infection, abscess, leak, reoperation, possible ostomy, hernia, heart attack, death, and other risks were discussed.  I noted a good likelihood this will help address the problem.   Goals of post-operative recovery were discussed as well.    The patient expressed understanding & wished to proceed with surgery.  OR FINDINGS:   Patient had mass in distal transverse colon  with tattoo proximal  No obvious metastatic disease on visceral parietal peritoneum or liver.  DESCRIPTION:   Informed consent was confirmed.  The patient underwent general anaesthesia without difficulty.  The patient was positioned appropriately.  VTE prevention in place.  The patient's abdomen was clipped, prepped, & draped in a sterile fashion.  Surgical timeout confirmed our plan.  The patient was positioned in reverse Trendelenburg.  Abdominal entry was gained using a Varies needle in the LUQ.  Entry was clean.  I induced carbon dioxide insufflation.  An 8mm robotic port was placed in the RUQ.  Camera inspection revealed no injury.  Extra ports were carefully placed under direct laparoscopic visualization.  I laparoscopically reflected the greater omentum and the upper abdomen the small bowel in the pelvis.  There was tattoo noted in the area of the splenic flexure, specifically in the distal transverse colon.  The patient was appropriately positioned and the robot was docked to the patient's left side.  Instruments were placed under direct visualization.    I began by taking down the splenic flexure.  I divided the omentum away from the colon using the robotic vessel sealer.  I then took down the splenocolic ligaments and continued down the white line of Toldt towards the sigmoid colon to allow for mobilization.  I identified the left branch of the middle colic artery and divided this using the robotic vessel sealer.  I took the mesentery with the vessel sealer up to the level of the area of the transverse colon just distal to the middle branch of the middle  colic artery.  I then divided the mesentery away from the retroperitoneum down to the level of the left colic artery.  I then divided the mesentery up to just proximal to this vessel to allow for good perfusion of both ends of the colon.  I then divided the colon using 260 mm blue load staplers.  I arranged the colon and a isoperistaltic fashion.   Enterotomies were made in the in the distal transverse colon and remaining descending colon.  A white load robotic stapler was used to create an anastomosis.  The common defect was closed using 2, running 2-0 V-Loc sutures.  The anastomosis appeared widely patent.  There was no signs of hemorrhage or injury in the abdomen.  At this point the robot was undocked.  The 12 mm suprapubic port was enlarged to a [name] incision.  An Alexis wound protector was placed. The specimen was removed from the abdomen and evaluated.  The abdomen was irrigated.  There was no sign of active bleeding.  The Alexis wound protector was removed.  The peritoneum of the [name] incision was closed using a running 0 Vicryl suture.  The fascia was then closed using 2, #1 Novafil running sutures.  The subcutaneous tissue of the extraction incision was closed using a running 2-0 Vicryl suture. The skin was then closed using running subcuticular 4-0 Vicryl sutures.  A sterile dressing was applied.  The remaining port sites were closed using interrupted 4-0 Vicryl sutures and Dermabond. All counts were correct per operating room staff. The patient was then awakened from anesthesia and sent to the post anesthesia care unit in stable condition.    Vanita Panda, MD  Colorectal and General Surgery [facility]

## 2023-01-10 NOTE — Anesthesia Preprocedure Evaluation (Signed)
Anesthesia Evaluation  Patient identified by MRN, date of birth, ID band Patient awake    Reviewed: Allergy & Precautions, H&P , NPO status , Patient's Chart, lab work & pertinent test results  Airway Mallampati: II   Neck ROM: full    Dental   Pulmonary former smoker   breath sounds clear to auscultation       Cardiovascular hypertension, + Valvular Problems/Murmurs  Rhythm:regular Rate:Normal  S/p AVR   Neuro/Psych    GI/Hepatic   Endo/Other    Renal/GU      Musculoskeletal  (+) Arthritis ,    Abdominal   Peds  Hematology   Anesthesia Other Findings   Reproductive/Obstetrics                             Anesthesia Physical Anesthesia Plan  ASA: 3  Anesthesia Plan: General   Post-op Pain Management:    Induction: Intravenous  PONV Risk Score and Plan: 2 and Ondansetron, Dexamethasone and Treatment may vary due to age or medical condition  Airway Management Planned: Oral ETT  Additional Equipment:   Intra-op Plan:   Post-operative Plan: Extubation in OR  Informed Consent: I have reviewed the patients History and Physical, chart, labs and discussed the procedure including the risks, benefits and alternatives for the proposed anesthesia with the patient or authorized representative who has indicated his/her understanding and acceptance.     Dental advisory given  Plan Discussed with: CRNA, Surgeon and Anesthesiologist  Anesthesia Plan Comments:        Anesthesia Quick Evaluation

## 2023-01-11 ENCOUNTER — Encounter (HOSPITAL_COMMUNITY): Payer: Self-pay | Admitting: General Surgery

## 2023-01-11 LAB — CBC
HCT: 35.4 % — ABNORMAL LOW (ref 39.0–52.0)
Hemoglobin: 11.3 g/dL — ABNORMAL LOW (ref 13.0–17.0)
MCH: 28.8 pg (ref 26.0–34.0)
MCHC: 31.9 g/dL (ref 30.0–36.0)
MCV: 90.3 fL (ref 80.0–100.0)
Platelets: 117 10*3/uL — ABNORMAL LOW (ref 150–400)
RBC: 3.92 MIL/uL — ABNORMAL LOW (ref 4.22–5.81)
RDW: 13.7 % (ref 11.5–15.5)
WBC: 7.2 10*3/uL (ref 4.0–10.5)
nRBC: 0 % (ref 0.0–0.2)

## 2023-01-11 MED ORDER — CENTRUM SILVER 50+MEN PO TABS: 1.0000 | ORAL_TABLET | Freq: Every day | ORAL | Status: DC

## 2023-01-11 MED ORDER — ADULT MULTIVITAMIN W/MINERALS CH
1.0000 | ORAL_TABLET | Freq: Every day | ORAL | Status: DC
  Administered 2023-01-11 – 2023-01-12 (×2): 1 via ORAL
  Filled 2023-01-11 (×2): qty 1

## 2023-01-11 NOTE — Progress Notes (Signed)
01/11/2023  Frederick Burnett [id number] Apr 14, 1940  CARE TEAM: PCP: Irven Coe, MD  Outpatient Care Team: Patient Care Team: Irven Coe, MD as PCP - General (Family Medicine) Nahser, Deloris Ping, MD as PCP - Cardiology (Cardiology) Malachy Mood, MD as Consulting Physician (Hematology and Oncology)  Inpatient Treatment Team: Treatment Team:  Romie Levee, MD Jamesetta Orleans, LPN Fenton Malling, NT [place], [place] Alyse Low, RN   Problem List:   Principal Problem:   Cancer of transverse colon [facility]) Active Problems:   Essential hypertension   History of pulmonary embolism   History of DVT (deep vein thrombosis)   NICM (nonischemic cardiomyopathy) (HCC)   Coronary artery disease involving native coronary artery of native heart without angina pectoris   Hyperlipidemia   01/10/2023  POST-OPERATIVE DIAGNOSIS:  COLON MASS   PROCEDURE:  XI ROBOT ASSISTED PARTIAL COLECTOMY with SPLENIC FLEXURE MOBILIZATION    Surgeon(s):  Romie Levee, MD  OR FINDINGS:    Patient had mass in distal transverse colon with tattoo proximal   No obvious metastatic disease on visceral parietal peritoneum or liver.  Assessment [facility] Stay = 1 days) 1 Day Post-Op    Relatively stable    Plan:  ERAS protocol  Gradually advance diet  Follow-up on pathology  Multimodal pain control   Hypertension control  -VTE prophylaxis- SCDs, etc. prophylactic enoxaparin for first 48 hours.  Most likely can go back to oral Eliquis starting tomorrow.  -mobilize as tolerated to help recovery  -Disposition:  Disposition:  The patient is from: Home Anticipate discharge to:  Home Anticipated Date of Discharge is:  November 18,2024   Barriers to discharge:  Pending Clinical improvement (more likely than not)  Patient currently is NOT MEDICALLY STABLE for discharge from the hospital from a surgery standpoint.      I reviewed last 24 h vitals and pain scores, last 48 h intake and  output, last 24 h labs and trends, and last 24 h imaging results.  I have reviewed this patient's available data, including medical history, events of note, test results, etc as part of my evaluation.   A significant portion of that time was spent in counseling. Care during the described time interval was provided by me.  This care required moderate level of medical decision making.  01/11/2023    Subjective: (Chief complaint)  Patient with mild soreness.  Got up and walked.  No major events  Objective:  Vital signs:  Vitals:   01/10/23 2121 01/11/23 0142 01/11/23 0500 01/11/23 0504  BP: 117/76 112/70  110/76  Pulse: 80 70  71  Resp: 16 15  16   Temp: 97.9 F (36.6 C) 97.7 F (36.5 C)  98.5 F (36.9 C)  TempSrc: Oral Oral  Oral  SpO2: 99% 100%  99%  Weight:   88 kg   Height:        Last BM Date : 01/09/23  Intake/Output   Yesterday:  11/15 0701 - 11/16 0700 In: 2481.5 [P.O.:540; I.V.:1841.5; IV Piggyback:100] Out: 2570 [Urine:2550; Blood:20] This shift:  No intake/output data recorded.  Bowel function:  Flatus: No  BM:  No  Drain: (No drain)   Physical Exam:  General: Pt awake/alert in no acute distress Eyes: PERRL, normal EOM.  Sclera clear.  No icterus Neuro: CN II-XII intact w/o focal sensory/motor deficits. Lymph: No head/neck/groin lymphadenopathy Psych:  No delerium/psychosis/paranoia.  Oriented x 4 HENT: Normocephalic, Mucus membranes moist.  No thrush Neck: Supple, No tracheal deviation.  No  obvious thyromegaly Chest: No pain to chest wall compression.  Good respiratory excursion.  No audible wheezing CV:  Pulses intact.  Regular rhythm.  No major extremity edema MS: Normal AROM mjr joints.  No obvious deformity  Abdomen: Soft.  Nondistended.  Nontender.  No evidence of peritonitis.  No incarcerated hernias.  Ext:   No deformity.  No mjr edema.  No cyanosis Skin: No petechiae / purpurea.  No major sores.  Warm and dry    Results:    Cultures: No results found for this or any previous visit (from the past 720 hour(s)).  Labs: No results found for this or any previous visit (from the past 48 hour(s)).  Imaging / Studies: No results found.  Medications / Allergies: per chart  Antibiotics: Anti-infectives (From admission, onward)    Start     Dose/Rate Route Frequency Ordered Stop   01/10/23 2200  cefoTEtan (CEFOTAN) 2 g in sodium chloride 0.9 % 100 mL IVPB        2 g 200 mL/hr over 30 Minutes Intravenous Every 12 hours 01/10/23 1558 01/10/23 2130   01/10/23 0815  cefoTEtan (CEFOTAN) 2 g in sodium chloride 0.9 % 100 mL IVPB        2 g 200 mL/hr over 30 Minutes Intravenous On call to O.R. 01/10/23 0803 01/10/23 1639         Note: Portions of this report may have been transcribed using voice recognition software. Every effort was made to ensure accuracy; however, inadvertent computerized transcription errors may be present.   Any transcriptional errors that result from this process are unintentional.    Ardeth Sportsman, MD, FACS, MASCRS Esophageal, Gastrointestinal & Colorectal Surgery Robotic and Minimally Invasive Surgery  [facility] [address], [address] [place], [place] [address] [phone] Fax [phone] Main  CONTACT INFORMATION: Weekday (9AM-5PM): Call [facility] main office at [phone] Weeknight (5PM-9AM) or Weekend/Holiday: Check EPIC "Web Links" tab & use "AMION" (password " [email or web address]") for General Surgery [facility] coverage  Please, DO NOT use SecureChat  (it is not reliable communication to reach operating surgeons & will lead to a delay in care).   Epic staff messaging available for outptient concerns needing 1-2 business day response.      01/11/2023  8:39 AM

## 2023-01-11 NOTE — Progress Notes (Signed)
Mobility Specialist - Progress Note   01/11/23 1047  Mobility  Activity Ambulated with assistance in hallway  Level of Assistance Standby assist, set-up cues, supervision of patient - no hands on  Assistive Device Front wheel walker  Distance Ambulated (ft) 275 ft  Activity Response Tolerated well  Mobility Referral Yes  $Mobility charge 1 Mobility  Mobility Specialist Start Time (ACUTE ONLY) 0911  Mobility Specialist Stop Time (ACUTE ONLY) [id number]  Mobility Specialist Time Calculation (min) (ACUTE ONLY) 10 min   Pt received in bed and agreeable to mobility. Pt was minA from STS. No complaints during session. Pt to bed after session with all needs met.    [facility]

## 2023-01-12 LAB — CBC
HCT: 32.1 % — ABNORMAL LOW (ref 39.0–52.0)
Hemoglobin: 10.4 g/dL — ABNORMAL LOW (ref 13.0–17.0)
MCH: 28.7 pg (ref 26.0–34.0)
MCHC: 32.4 g/dL (ref 30.0–36.0)
MCV: 88.4 fL (ref 80.0–100.0)
Platelets: 122 10*3/uL — ABNORMAL LOW (ref 150–400)
RBC: 3.63 MIL/uL — ABNORMAL LOW (ref 4.22–5.81)
RDW: 13.7 % (ref 11.5–15.5)
WBC: 5.4 10*3/uL (ref 4.0–10.5)
nRBC: 0 % (ref 0.0–0.2)

## 2023-01-12 LAB — CREATININE, SERUM
Creatinine, Ser: 1.22 mg/dL (ref 0.61–1.24)
GFR, Estimated: 59 mL/min — ABNORMAL LOW (ref >60)

## 2023-01-12 LAB — POTASSIUM: Potassium: 4.2 mmol/L (ref 3.5–5.1)

## 2023-01-12 MED ORDER — TRAMADOL HCL 50 MG PO TABS: 50.0000 mg | ORAL_TABLET | Freq: Four times a day (QID) | ORAL | 0 refills | Status: AC | PRN

## 2023-01-12 NOTE — Discharge Summary (Signed)
Physician Discharge Summary    Frederick Burnett MRN: [id number] DOB/AGE: [birth year] = 82 y.o.  Patient Care Team: Irven Coe, MD as PCP - General (Family Medicine) Nahser, Deloris Ping, MD as PCP - Cardiology (Cardiology) Malachy Mood, MD as Consulting Physician (Hematology and Oncology)  Admit date: 01/10/2023  Discharge date: 01/12/2023  Hospital Stay = 2 days    Discharge Diagnoses:  Principal Problem:   Cancer of transverse colon [facility]) Active Problems:   Essential hypertension   History of pulmonary embolism   History of DVT (deep vein thrombosis)   NICM (nonischemic cardiomyopathy) (HCC)   Coronary artery disease involving native coronary artery of native heart without angina pectoris   Hyperlipidemia   2 Days Post-Op  01/10/2023  POST-OPERATIVE DIAGNOSIS:  TRANSVERSE COLON MASS   PROCEDURE:  XI ROBOT ASSISTED PARTIAL COLECTOMY with SPLENIC FLEXURE MOBILIZATION    Surgeon(s):  Romie Levee, MD   OR FINDINGS:    Patient had mass in distal transverse colon with tattoo proximal   No obvious metastatic disease on visceral parietal peritoneum or liver.  Consults: Case Management / Social Work and Anesthesia  Hospital Course:   The patient underwent the surgery above.  Postoperatively, the patient gradually mobilized and advanced to a solid diet.  Pain and other symptoms were treated aggressively.    By the time of discharge, the patient was walking well the hallways, eating food, having flatus.  Pain was well-controlled on an oral medications.  Based on meeting discharge criteria and continuing to recover, I felt it was safe for the patient to be discharged from the hospital to further recover with close followup. Postoperative recommendations were discussed in detail.  They are written as well.  Discharged Condition: good  Discharge Exam: Blood pressure 104/66, pulse 66, temperature 98.2 F (36.8 C), temperature source Oral, resp. rate 16, height 5\' 9"  (1.753 m),  weight 88 kg, SpO2 98%.  General: Pt awake/alert/oriented x4 in No acute distress Eyes: PERRL, normal EOM.  Sclera clear.  No icterus Neuro: CN II-XII intact w/o focal sensory/motor deficits. Lymph: No head/neck/groin lymphadenopathy Psych:  No delerium/psychosis/paranoia HENT: Normocephalic, Mucus membranes moist.  No thrush Neck: Supple, No tracheal deviation Chest:  No chest wall pain w good excursion CV:  Pulses intact.  Regular rhythm MS: Normal AROM mjr joints.  No obvious deformity Abdomen: Soft.  Nondistended.  Nontender.  No evidence of peritonitis.  No incarcerated hernias. Ext:  SCDs BLE.  No mjr edema.  No cyanosis Skin: No petechiae / purpura   Disposition:    Follow-up Information     Romie Levee, MD Follow up on 01/27/2023.   Specialties: General Surgery, Colon and Rectal Surgery Why: To follow up after your operation, To follow up after your hospital stay Contact information: [address] [place] [address] [phone]                 Discharge disposition: 01-Home or Self Care       Discharge Instructions     Call MD for:   Complete by: As directed    FEVER > 101.5 F  (temperatures < 101.5 F are not significant)   Call MD for:  extreme fatigue   Complete by: As directed    Call MD for:  persistant dizziness or light-headedness   Complete by: As directed    Call MD for:  persistant nausea and vomiting   Complete by: As directed    Call MD for:  redness, tenderness,  or signs of infection (pain, swelling, redness, odor or green/yellow discharge around incision site)   Complete by: As directed    Call MD for:  severe uncontrolled pain   Complete by: As directed    Diet - low sodium heart healthy   Complete by: As directed    Start with a bland diet such as soups, liquids, starchy foods, low fat foods, etc. the first few days at home. Gradually advance to a solid, low-fat, high fiber diet by the end of the first week  at home.   Add a fiber supplement to your diet (Metamucil, etc) If you feel full, bloated, or constipated, stay on a full liquid or pureed/blenderized diet for a few days until you feel better and are no longer constipated.   Discharge instructions   Complete by: As directed    See Discharge Instructions If you are not getting better after two weeks or are noticing you are getting worse, contact our office [phone] for further advice.  We may need to adjust your medications, re-evaluate you in the office, send you to the emergency room, or see what other things we can do to help. The clinic staff is available to answer your questions during regular business hours (8:30am-5pm).  Please don't hesitate to call and ask to speak to one of our nurses for clinical concerns.    A surgeon from [facility] is always on call at the hospitals 24 hours/day If you have a medical emergency, go to the nearest emergency room or call 911.   Discharge wound care:   Complete by: As directed    It is good for closed incisions and even open wounds to be washed every day.  Shower every day.  Short baths are fine.  Wash the incisions and wounds clean with soap & water.    You may leave closed incisions open to air if it is dry.   You may cover the incision with clean gauze & replace it after your daily shower for comfort.  DERMABOND:  You have purple skin glue (Dermabond) on your incision(s).  Leave them in place, and they will fall off on their own like a scab in 2-3 weeks.  You may trim any edges that curl up with clean scissors.   Driving Restrictions   Complete by: As directed    You may drive when: - you are no longer taking narcotic prescription pain medication - you can comfortably wear a seatbelt - you can safely make sudden turns/stops without pain.   Increase activity slowly   Complete by: As directed    Start light daily activities --- self-care, walking, climbing stairs- beginning the  day after surgery.  Gradually increase activities as tolerated.  Control your pain to be active.  Stop when you are tired.  Ideally, walk several times a day, eventually an hour a day.   Most people are back to most day-to-day activities in a few weeks.  It takes 4-6 weeks to get back to unrestricted, intense activity. If you can walk 30 minutes without difficulty, it is safe to try more intense activity such as jogging, treadmill, bicycling, low-impact aerobics, swimming, etc. Save the most intensive and strenuous activity for last (Usually 4-8 weeks after surgery) such as sit-ups, heavy lifting, contact sports, etc.  Refrain from any intense heavy lifting or straining until you are off narcotics for pain control.  You will have off days, but things should improve week-by-week. DO NOT PUSH  THROUGH PAIN.  Let pain be your guide: If it hurts to do something, don't do it.   Lifting restrictions   Complete by: As directed    If you can walk 30 minutes without difficulty, it is safe to try more intense activity such as jogging, treadmill, bicycling, low-impact aerobics, swimming, etc. Save the most intensive and strenuous activity for last (Usually 4-8 weeks after surgery) such as sit-ups, heavy lifting, contact sports, etc.   Refrain from any intense heavy lifting or straining until you are off narcotics for pain control.  You will have off days, but things should improve week-by-week. DO NOT PUSH THROUGH PAIN.  Let pain be your guide: If it hurts to do something, don't do it.  Pain is your body warning you to avoid that activity for another week until the pain goes down.   May shower / Bathe   Complete by: As directed    May walk up steps   Complete by: As directed    Remove dressing in 72 hours   Complete by: As directed    Make sure all dressings are removed by the third day after surgery.  Leave incisions open to air.  OK to cover incisions with gauze or bandages as desired   Sexual Activity  Restrictions   Complete by: As directed    You may have sexual intercourse when it is comfortable. If it hurts to do something, stop.       Allergies as of 01/12/2023   No Known Allergies      Medication List     STOP taking these medications    enoxaparin 80 MG/0.8ML injection Commonly known as: Lovenox   metroNIDAZOLE 500 MG tablet Commonly known as: FLAGYL   neomycin 500 MG tablet Commonly known as: MYCIFRADIN       TAKE these medications    apixaban 5 MG Tabs tablet Commonly known as: ELIQUIS Take 1 tablet (5 mg total) by mouth 2 (two) times daily.   atorvastatin 20 MG tablet Commonly known as: LIPITOR Take 1 tablet (20 mg total) by mouth daily. Please schedule appointment for future refills. 3rd and final attempt. Thank you What changed:  when to take this additional instructions   Centrum Silver 50+Men Tabs Take 1 tablet by mouth daily.   lisinopril-hydrochlorothiazide 10-12.5 MG tablet Commonly known as: ZESTORETIC Take 1 tablet by mouth daily.   metoprolol tartrate 25 MG tablet Commonly known as: LOPRESSOR Take 1 tablet (25 mg total) by mouth 2 (two) times daily.   spironolactone 25 MG tablet Commonly known as: ALDACTONE Take 1 tablet (25 mg total) by mouth daily.   traMADol 50 MG tablet Commonly known as: ULTRAM Take 1-2 tablets (50-100 mg total) by mouth every 6 (six) hours as needed for moderate pain (pain score 4-6).               Discharge Care Instructions  (From admission, onward)           Start     Ordered   01/12/23 0000  Discharge wound care:       Comments: It is good for closed incisions and even open wounds to be washed every day.  Shower every day.  Short baths are fine.  Wash the incisions and wounds clean with soap & water.    You may leave closed incisions open to air if it is dry.   You may cover the incision with clean gauze & replace it after your daily shower  for comfort.  DERMABOND:  You have purple skin  glue (Dermabond) on your incision(s).  Leave them in place, and they will fall off on their own like a scab in 2-3 weeks.  You may trim any edges that curl up with clean scissors.   01/12/23 0845            Significant Diagnostic Studies:  Results for orders placed or performed during the hospital encounter of 01/10/23 (from the past 72 hour(s))  CBC     Status: Abnormal   Collection Time: 01/11/23  8:28 AM  Result Value Ref Range   WBC 7.2 4.0 - 10.5 K/uL   RBC 3.92 (L) 4.22 - 5.81 MIL/uL   Hemoglobin 11.3 (L) 13.0 - 17.0 g/dL   HCT 16.1 (L) 09.6 - 04.5 %   MCV 90.3 80.0 - 100.0 fL   MCH 28.8 26.0 - 34.0 pg   MCHC 31.9 30.0 - 36.0 g/dL   RDW 40.9 81.1 - 91.4 %   Platelets 117 (L) 150 - 400 K/uL   nRBC 0.0 0.0 - 0.2 %    Comment: Performed at [facility], [address], [place], [place] [address]  CBC     Status: Abnormal   Collection Time: 01/12/23  5:03 AM  Result Value Ref Range   WBC 5.4 4.0 - 10.5 K/uL   RBC 3.63 (L) 4.22 - 5.81 MIL/uL   Hemoglobin 10.4 (L) 13.0 - 17.0 g/dL   HCT 62.1 (L) 30.8 - 65.7 %   MCV 88.4 80.0 - 100.0 fL   MCH 28.7 26.0 - 34.0 pg   MCHC 32.4 30.0 - 36.0 g/dL   RDW 84.6 96.2 - 95.2 %   Platelets 122 (L) 150 - 400 K/uL    Comment: SPECIMEN CHECKED FOR CLOTS REPEATED TO VERIFY    nRBC 0.0 0.0 - 0.2 %    Comment: Performed at [facility], [address] [place], [place] [address]  Potassium     Status: None   Collection Time: 01/12/23  5:03 AM  Result Value Ref Range   Potassium 4.2 3.5 - 5.1 mmol/L    Comment: Performed at [facility], [address] [place], [place] [address]  Creatinine, serum     Status: Abnormal   Collection Time: 01/12/23  5:03 AM  Result Value Ref Range   Creatinine, Ser 1.22 0.61 - 1.24 mg/dL   GFR, Estimated 59 (L) >60 mL/min    Comment: (NOTE) Calculated using the CKD-EPI Creatinine Equation (2021) Performed at [facility], [address] [place], [place] [address]     No results found.  Past Medical History:  Diagnosis Date   Aortic regurgitation    Aortic root enlargement (HCC)    Arthritis    both shoulders - RC- wear   Heart murmur    History of pulmonary embolism 04/24/2014   Provoked; occurred after AVR in 2016   HTN (hypertension)    Hx of echocardiogram    Echo 3/16: mild LVH, EF 40-45%, ant-septal AK, AVR with elevated velocity (3.3 m/s - slightly above expected value), mild LAE //  Echo 3/17: Mild LVH, EF 40%, diffuse HK, indeterminate diastolic function, bioprosthetic AVR well-functioning (mean gradient 9 mmHg), MAC, mild LAE, mildly reduced RVSF, mild RAE   Hypercholesteremia     Past Surgical History:  Procedure Laterality Date   AORTIC VALVE REPLACEMENT N/A 04/11/2014   Procedure: AORTIC VALVE REPLACEMENT (AVR);  Surgeon: Payton Doughty  Laneta Simmers, MD;  Location: [facility] OR;  Service: Open Heart Surgery;  Laterality: N/A;   CARDIAC CATHETERIZATION     IR IVC FILTER PLMT / S&I /IMG GUID/MOD SED  01/06/2023   LEFT AND RIGHT HEART CATHETERIZATION WITH CORONARY ANGIOGRAM N/A 02/22/2014   Procedure: LEFT AND RIGHT HEART CATHETERIZATION WITH CORONARY ANGIOGRAM;  Surgeon: Lesleigh Noe, MD;  Location: [facility] CATH LAB;  Service: Cardiovascular;  Laterality: N/A;   MULTIPLE TOOTH EXTRACTIONS     TEE WITHOUT CARDIOVERSION N/A 03/24/2014   Procedure: TRANSESOPHAGEAL ECHOCARDIOGRAM (TEE);  Surgeon: Lars Masson, MD;  Location: [facility] ENDOSCOPY;  Service: Cardiovascular;  Laterality: N/A;   TEE WITHOUT CARDIOVERSION N/A 04/11/2014   Procedure: TRANSESOPHAGEAL ECHOCARDIOGRAM (TEE);  Surgeon: Alleen Borne, MD;  Location: [facility] OR;  Service: Open Heart Surgery;  Laterality: N/A;    Social History   Socioeconomic History   Marital status: Widowed    Spouse name: Not on file   Number of children: Not on file   Years of education: Not on file   Highest education level: Not on file  Occupational History   Not on file   Tobacco Use   Smoking status: Former    Types: Cigars    Quit date: 05/29/2001    Years since quitting: 21.6   Smokeless tobacco: Never  Vaping Use   Vaping status: Never Used  Substance and Sexual Activity   Alcohol use: No    Alcohol/week: 0.0 standard drinks of alcohol   Drug use: No   Sexual activity: Not on file  Other Topics Concern   Not on file  Social History Narrative   Not on file   Social Determinants of Health   Financial Resource Strain: Not on file  Food Insecurity: No Food Insecurity (01/10/2023)   Hunger Vital Sign    Worried About Running Out of Food in the Last Year: Never true    Ran Out of Food in the Last Year: Never true  Transportation Needs: No Transportation Needs (01/10/2023)   PRAPARE - Administrator, Civil Service (Medical): No    Lack of Transportation (Non-Medical): No  Physical Activity: Not on file  Stress: Not on file  Social Connections: Not on file  Intimate Partner Violence: Patient Declined (01/10/2023)   Humiliation, Afraid, Rape, and Kick questionnaire    Fear of Current or Ex-Partner: Patient declined    Emotionally Abused: Patient declined    Physically Abused: Patient declined    Sexually Abused: Patient declined    Family History  Problem Relation Age of Onset   Hypertension Mother    Heart attack Father    Hypertension Daughter    Hypertension Sister    Hypertension Brother    Stroke Neg Hx     Current Facility-Administered Medications  Medication Dose Route Frequency Provider Last Rate Last Admin   acetaminophen (TYLENOL) tablet 1,000 mg  1,000 mg Oral Q6H Romie Levee, MD   1,000 mg at 01/12/23 0519   alum & mag hydroxide-simeth (MAALOX/MYLANTA) 200-200-20 MG/5ML suspension 30 mL  30 mL Oral Q6H PRN Romie Levee, MD       alvimopan (ENTEREG) capsule 12 mg  12 mg Oral BID Romie Levee, MD   12 mg at 01/12/23 0755   atorvastatin (LIPITOR) tablet 20 mg  20 mg Oral QHS Romie Levee, MD   20 mg at  01/11/23 2056   diphenhydrAMINE (BENADRYL) 12.5 MG/5ML elixir 12.5 mg  12.5 mg Oral Q6H PRN Romie Levee, MD  Or   diphenhydrAMINE (BENADRYL) injection 12.5 mg  12.5 mg Intravenous Q6H PRN Romie Levee, MD       enoxaparin (LOVENOX) injection 40 mg  40 mg Subcutaneous Q24H Romie Levee, MD   40 mg at 01/12/23 0756   feeding supplement (ENSURE SURGERY) liquid 237 mL  237 mL Oral BID BM Romie Levee, MD   237 mL at 01/11/23 1452   gabapentin (NEURONTIN) capsule 300 mg  300 mg Oral BID Romie Levee, MD   300 mg at 01/12/23 0755   lisinopril (ZESTRIL) tablet 10 mg  10 mg Oral Daily Romie Levee, MD   10 mg at 01/12/23 0756   And   hydrochlorothiazide (HYDRODIURIL) tablet 12.5 mg  12.5 mg Oral Daily Romie Levee, MD   12.5 mg at 01/12/23 0755   HYDROmorphone (DILAUDID) injection 0.5 mg  0.5 mg Intravenous Q3H PRN Romie Levee, MD       metoprolol tartrate (LOPRESSOR) tablet 25 mg  25 mg Oral BID Romie Levee, MD   25 mg at 01/12/23 0755   multivitamin with minerals tablet 1 tablet  1 tablet Oral Daily Romie Levee, MD   1 tablet at 01/12/23 0754   ondansetron (ZOFRAN) tablet 4 mg  4 mg Oral Q6H PRN Romie Levee, MD       Or   ondansetron [facility]) injection 4 mg  4 mg Intravenous Q6H PRN Romie Levee, MD       saccharomyces boulardii (FLORASTOR) capsule 250 mg  250 mg Oral BID Romie Levee, MD   250 mg at 01/12/23 0756   simethicone (MYLICON) chewable tablet 40 mg  40 mg Oral Q6H PRN Romie Levee, MD       spironolactone (ALDACTONE) tablet 25 mg  25 mg Oral Daily Romie Levee, MD   25 mg at 01/12/23 0755   traMADol Janean Sark) tablet 50-100 mg  50-100 mg Oral Q6H PRN Romie Levee, MD         No Known Allergies  Signed:   Ardeth Sportsman, MD, FACS, MASCRS Esophageal, Gastrointestinal & Colorectal Surgery Robotic and Minimally Invasive Surgery  [facility] [address], [address] [place], [place]  [address] [phone] Fax [phone] Main  CONTACT INFORMATION: Weekday (9AM-5PM): Call [facility] main office at [phone] Weeknight (5PM-9AM) or Weekend/Holiday: Check EPIC "Web Links" tab & use "AMION" (password " [email or web address]") for General Surgery [facility] coverage  Please, DO NOT use SecureChat  (it is not reliable communication to reach operating surgeons & will lead to a delay in care).   Epic staff messaging available for outptient concerns needing 1-2 business day response.      01/12/2023, 9:08 AM

## 2023-01-12 NOTE — Progress Notes (Signed)
Reviewed d/c instructions with pt. All questions answered. Pt taken to front entrance via wheelchair to meet ride.

## 2023-01-12 NOTE — Progress Notes (Signed)
   01/12/23 0917  TOC Brief Assessment  Insurance and Status Reviewed  Patient has primary care physician Yes  Home environment has been reviewed Resides with children in a single family home  Prior level of function: Independent at baseline  Prior/Current Home Services No current home services  Social Determinants of Health Reivew SDOH reviewed no interventions necessary  Readmission risk has been reviewed Yes  Transition of care needs no transition of care needs at this time

## 2023-01-12 NOTE — Anesthesia Postprocedure Evaluation (Signed)
Anesthesia Post Note  Patient: Frederick Burnett  Procedure(s) Performed: XI ROBOT ASSISTED LAPAROSCOPIC PARTIAL COLECTOMY AND SPLENIC FLEXURE MOBILIZATION     Patient location during evaluation: PACU Anesthesia Type: General Level of consciousness: awake and alert Pain management: pain level controlled Vital Signs Assessment: post-procedure vital signs reviewed and stable Respiratory status: spontaneous breathing, nonlabored ventilation, respiratory function stable and patient connected to nasal cannula oxygen Cardiovascular status: blood pressure returned to baseline and stable Postop Assessment: no apparent nausea or vomiting Anesthetic complications: no   No notable events documented.  Last Vitals:  Vitals:   01/11/23 2156 01/12/23 0622  BP: 111/75 104/66  Pulse: 69 66  Resp: 12 16  Temp: 36.7 C 36.8 C  SpO2: 99% 98%    Last Pain:  Vitals:   01/12/23 0622  TempSrc: Oral  PainSc:                  Kamorah Nevils S

## 2023-01-13 LAB — SURGICAL PATHOLOGY

## 2023-01-15 ENCOUNTER — Other Ambulatory Visit: Payer: Self-pay

## 2023-01-15 ENCOUNTER — Other Ambulatory Visit: Payer: Self-pay | Admitting: *Deleted

## 2023-01-15 DIAGNOSIS — I5022 Chronic systolic (congestive) heart failure: Secondary | ICD-10-CM

## 2023-01-15 DIAGNOSIS — I10 Essential (primary) hypertension: Secondary | ICD-10-CM

## 2023-01-15 DIAGNOSIS — Z952 Presence of prosthetic heart valve: Secondary | ICD-10-CM

## 2023-01-15 DIAGNOSIS — Z79899 Other long term (current) drug therapy: Secondary | ICD-10-CM

## 2023-01-15 DIAGNOSIS — I428 Other cardiomyopathies: Secondary | ICD-10-CM

## 2023-01-15 DIAGNOSIS — Q2381 Bicuspid aortic valve: Secondary | ICD-10-CM

## 2023-01-15 DIAGNOSIS — I2510 Atherosclerotic heart disease of native coronary artery without angina pectoris: Secondary | ICD-10-CM

## 2023-01-15 MED ORDER — SPIRONOLACTONE 25 MG PO TABS: 25.0000 mg | ORAL_TABLET | Freq: Every day | ORAL | 2 refills | Status: AC

## 2023-01-15 NOTE — Progress Notes (Signed)
The proposed treatment discussed in conference is for discussion purpose only and is not a binding recommendation.  The patients have not been physically examined, or presented with their treatment options.  Therefore, final treatment plans cannot be decided.  

## 2023-01-26 LAB — MOLECULAR PATHOLOGY

## 2023-01-26 NOTE — Assessment & Plan Note (Signed)
01/10/2023 - robotic assisted partial colectomy. -Cytology confirmed moderately differentiated, pT4a pN0.  This is a stage IIb colon cancer. 01/15/2023 - case was discussed via multidisciplinary tumor board.   -Postsurgical treatment with Xeloda versus observation alone. -01/28/2023 -patient has opted to try Xeloda daily for next 3 months.  Will see back in 3 weeks for toxicity check and follow-up.

## 2023-01-26 NOTE — Progress Notes (Unsigned)
Patient Care Team: Irven Coe, MD as PCP - General (Family Medicine) Nahser, Deloris Ping, MD as PCP - Cardiology (Cardiology) Malachy Mood, MD as Consulting Physician (Hematology and Oncology)  Clinic Day:  01/28/2023  Referring physician: Irven Coe, MD  ASSESSMENT & PLAN:   Assessment & Plan: Cancer of transverse colon (HCC) 01/10/2023 - robotic assisted partial colectomy. -Cytology confirmed moderately differentiated, pT4a pN0.  This is a stage IIb colon cancer. 01/15/2023 - case was discussed via multidisciplinary tumor board.   -Postsurgical treatment with Xeloda versus observation alone. -01/28/2023 -patient has opted to try Xeloda daily for next 3 months.  Will see back in 3 weeks for toxicity check and follow-up.   History of pulmonary embolism Currently on Eliquis 5 mg twice daily.  Did have IVC filter placed during surgery. -Referral to IR to remove IVC filter.  Patient will remain on Eliquis 5 mg twice daily.   Plan: Labs reviewed  -CBC showing WBC 4.4; Hgb 11.8; Hct 36.6; Plt 153; Anc 2.5 -CMP - K 4.4; glucose 81; BUN 23; Creatinine 1.61; eGFR 42; Ca 9.9; LFTs normal.   -Iron 89; TIBC 346; sat ratio 26 -Ferritin pending -CEA pending Patient underwent robotic assisted partial colectomy.  Cytology confirmed moderately differentiated carcinoma.  pT4a pN0.  Classified as stage IIb colon cancer. Patient opting for Xeloda, oral chemotherapy for 3 months. Referral placed to IR to have IVC filter removed.  Patient understands he is to continue oral Eliquis due to PE. Labs with follow-up in 3 weeks.  The patient understands the plans discussed today and is in agreement with them.  He knows to contact our office if he develops concerns prior to his next appointment.  I provided 25 minutes of face-to-face time during this encounter and > 50% was spent counseling as documented under my assessment and plan.    Carlean Jews, NP  [facility] [address] [place] [address] Dept: [phone] Dept Fax: [phone]   Orders Placed This Encounter  Procedures   Ambulatory referral to Interventional Radiology    Referral Priority:   Routine    Referral Type:   Consultation    Referral Reason:   Specialty Services Required    Requested Specialty:   Interventional Radiology    Number of Visits Requested:   1      CHIEF COMPLAINT:  CC: Multiple pulmonary emboli and adenocarcinoma of descending colon  Current Treatment: Eliquis 5 mg twice daily  INTERVAL HISTORY:  Myson is here today for repeat clinical assessment.  Was found to have malignant polyp in descending colon.  Underwent robotic assisted partial colectomy on 01/10/2023.  Cytology confirmed moderately differentiated, pT4a pN0.  His case was discussed via multidisciplinary tumor board.  Postsurgical treatment with Xeloda versus observation alone.  He did require IVC filter prior to surgery due to multiple DVTs and history of PE.  Eliquis was held for surgery.  Patient opting for Xeloda.  Will take for approximately 3 months.  Will have IVC filter removed.  Patient to continue Eliquis due to history of PE.  He denies fevers or chills. He denies pain.  He does have expected postsurgical tenderness of the abdomen.  His appetite is fair.  He states after diagnosis and prior to surgery he had very little appetite.  States appetite is very slowly returning.  Able to tolerate more foods.  His weight has decreased 17 pounds  over last 2 weeks . He denies chest pain, chest pressure, or shortness of breath. He denies headaches or visual disturbances. He denies nausea, vomiting, or changes in bowel or bladder habits.  Reports stools being very dark in color but not black.  No frank blood.  Denies blood in stool or hematemesis.  I have reviewed the past medical history, past surgical history, social history and family history  with the patient and they are unchanged from previous note.  ALLERGIES:  has No Known Allergies.  MEDICATIONS:  Current Outpatient Medications  Medication Sig Dispense Refill   apixaban (ELIQUIS) 5 MG TABS tablet Take 1 tablet (5 mg total) by mouth 2 (two) times daily. 60 tablet 6   atorvastatin (LIPITOR) 20 MG tablet Take 1 tablet (20 mg total) by mouth daily. Please schedule appointment for future refills. 3rd and final attempt. Thank you (Patient taking differently: Take 20 mg by mouth at bedtime.) 15 tablet 0   capecitabine (XELODA) 500 MG tablet Take 3 tabs every 12 hours for 14 days then off 7 days. Take 30 mins after chemo. Start on 02/10/2023 84 tablet 0   lisinopril-hydrochlorothiazide (ZESTORETIC) 10-12.5 MG tablet Take 1 tablet by mouth daily.     metoprolol tartrate (LOPRESSOR) 25 MG tablet Take 1 tablet (25 mg total) by mouth 2 (two) times daily. 60 tablet 3   Multiple Vitamins-Minerals (CENTRUM SILVER 50+MEN) TABS Take 1 tablet by mouth daily.     spironolactone (ALDACTONE) 25 MG tablet Take 1 tablet (25 mg total) by mouth daily. 90 tablet 2   traMADol (ULTRAM) 50 MG tablet Take 1-2 tablets (50-100 mg total) by mouth every 6 (six) hours as needed for moderate pain (pain score 4-6). 30 tablet 0   No current facility-administered medications for this visit.    HISTORY OF PRESENT ILLNESS:   Oncology History  Cancer of transverse colon (HCC)  09/03/2022 Procedure   Colonoscopy Findings: -Large, fungating mass 80 cm proximal to anus and descending colon.  Partially circumferential, involving two thirds of the colon lumen. -Several small polyps which were removed. -Medium sized lipoma in sigmoid colon -Internal hemorrhoids   10/14/2022 Imaging   CT abdomen and pelvis with contrast  IMPRESSION: 1. Apparent central filling defect within right lower lobe posterior segmental pulmonary artery, which may represent a pulmonary embolism. Recommend correlation with PE protocol CTA  chest. 2. Mildly featureless appearance of the splenic flexure with suspected mural thickening may correspond to known descending colon mass. 3. No evidence of metastatic disease in the abdomen or pelvis. 4. Cholelithiasis. 5. Enlargement of the prostate with median lobe hypertrophy. 6.  Aortic Atherosclerosis    10/15/2022 Imaging   CT chest angio with contrast  IMPRESSION: 1. Study is positive for large burden of occlusive and nonocclusive pulmonary embolism in the lungs bilaterally, probable clot in the right ventricle and CT evidence of right heart strain (RV/LV Ratio = 1.93) consistent with at least submassive (intermediate risk) PE. The presence of right heart strain has been associated with an increased risk of morbidity and mortality. Please refer to the "Code PE Focused" order set in EPIC. 2. Small pulmonary nodules measuring 6 mm or less in size. Non-contrast chest CT at 6 months is recommended. If the nodules are stable at time of repeat CT, then future CT at 18-24 months (from today's scan) is considered optional for low-risk patients, but is recommended for high-risk patients. This recommendation follows the consensus statement: Guidelines for Management of Incidental Pulmonary Nodules Detected on  CT Images: From the Fleischner Society 2017; Radiology 2017; 284:228-243. 3. Aortic atherosclerosis, in addition to left main and three-vessel coronary artery disease. Please note that although the presence of coronary artery calcium documents the presence of coronary artery disease, the severity of this disease and any potential stenosis cannot be assessed on this non-gated CT examination. Assessment for potential risk factor modification, dietary therapy or pharmacologic therapy may be warranted, if clinically indicated. 4. Aneurysmal dilatation of the ascending thoracic aorta (4.9 cm in diameter). Recommend semi-annual imaging followup by CTA or MRA and referral to cardiothoracic surgery if  not already obtained. This recommendation follows 2010 ACCF/AHA/AATS/ACR/ASA/SCA/SCAI/SIR/STS/SVM Guidelines for the Diagnosis and Management of Patients With Thoracic Aortic Disease. Circulation. 2010; 121: W098-J191. Aortic aneurysm NOS    01/10/2023 Initial Diagnosis   Cancer of transverse colon (HCC)   01/10/2023 Pathology Results   Colonic adenocarcinoma, 5.8 cm Carcinoma focally involves visceral peritoneum (pT4a) Negative for lymphovascular involvement Nineteen lymph nodes negative for metastatic carcinoma (0/19) (pN0) Two separate tubular adenomas All margins negative for carcinoma See oncology table  ONCOLOGY TABLE:   COLON AND RECTUM, CARCINOMA:  Resection Procedure: Partial colectomy Tumor Site: Splenic flexure Tumor Size: 5.8 x 3.5 x 1.3 cm Macroscopic Tumor Perforation: Not identified Macroscopic Evaluation of Mesorectum (required for rectal cancer): Not applicable Histologic Type: Colonic adenocarcinoma Histologic Grade: Moderately differentiated, G2 Multiple Primary Sites: Not applicable Tumor Extension: Into pericolonic connective tissue and focally involves visceral peritoneum Lymphovascular Invasion: Not identified Perineural Invasion: Not identified Treatment Effect: No known presurgical therapy Margins:      Margin Status for Invasive Carcinoma: All margins negative for invasive carcinoma Regional Lymph Nodes:      Number of Lymph Nodes with Tumor: 0      Number of Lymph Nodes Examined: 19 Tumor Deposits: Not identified Distant Metastasis:      Distant Site(s) Involved: Not applicable Pathologic Stage Classification (pTNM, AJCC 8th Edition): pT4a, pN0 Ancillary Studies: MMR and MSI studies are pending Representative Tumor Block: A3-A7 (v4.2.0.1)    01/10/2023 Surgery   Robotic assisted partial colectomy on 01/10/2023.  Cytology confirmed moderately differentiated, pT4a pN0.        REVIEW OF SYSTEMS:   Constitutional: Denies fevers, chills, or  night sweats.  Significantly decreased appetite.  Has lost 17 pounds in last 2 to 3 weeks. Eyes: Denies blurriness of vision Ears, nose, mouth, throat, and face: Denies mucositis or sore throat Respiratory: Denies cough, dyspnea or wheezes Cardiovascular: Denies palpitation, chest discomfort or lower extremity swelling Gastrointestinal:  Denies nausea, heartburn or change in bowel habits.  Has some abdominal tenderness postoperatively. Skin: Denies abnormal skin rashes Lymphatics: Denies new lymphadenopathy or easy bruising Neurological:Denies numbness, tingling or new weaknesses Behavioral/Psych: Mood is stable, no new changes  All other systems were reviewed with the patient and are negative.   VITALS:   Today's Vitals   01/28/23 1012 01/28/23 1013  BP: 116/72   Pulse: 60   Resp: 17   Temp: (!) 97.3 F (36.3 C)   TempSrc: Temporal   SpO2: 100%   Weight: 177 lb 1.6 oz (80.3 kg)   PainSc:  0-No pain   Body mass index is 26.15 kg/m.   Wt Readings from Last 3 Encounters:  01/28/23 177 lb 1.6 oz (80.3 kg)  01/12/23 194 lb 0.1 oz (88 kg)  01/08/23 187 lb (84.8 kg)    Body mass index is 26.15 kg/m.  Performance status (ECOG): 1 - Symptomatic but completely ambulatory  PHYSICAL EXAM:  GENERAL:alert, no distress and comfortable SKIN: skin color, texture, turgor are normal, no rashes or significant lesions EYES: normal, Conjunctiva are pink and non-injected, sclera clear OROPHARYNX:no exudate, no erythema and lips, buccal mucosa, and tongue normal  NECK: supple, thyroid normal size, non-tender, without nodularity LYMPH:  no palpable lymphadenopathy in the cervical, axillary or inguinal LUNGS: clear to auscultation and percussion with normal breathing effort HEART: regular rate & rhythm and no murmurs and no lower extremity edema ABDOMEN:abdomen soft with normal bowel sounds.  Well-healing laparoscopic surgical scars.  Mild generalized tenderness without  pain. Musculoskeletal:no cyanosis of digits and no clubbing  NEURO: alert & oriented x 3 with fluent speech, no focal motor/sensory deficits  LABORATORY DATA:  I have reviewed the data as listed    Component Value Date/Time   NA 138 01/28/2023 0940   NA 140 04/24/2022 1419   K 4.4 01/28/2023 0940   CL 106 01/28/2023 0940   CO2 22 01/28/2023 0940   GLUCOSE 81 01/28/2023 0940   BUN 23 01/28/2023 0940   BUN 13 04/24/2022 1419   CREATININE 1.61 (H) 01/28/2023 0940   CREATININE 1.16 06/23/2015 1159   CALCIUM 9.9 01/28/2023 0940   PROT 7.4 01/28/2023 0940   ALBUMIN 4.2 01/28/2023 0940   AST 19 01/28/2023 0940   ALT 11 01/28/2023 0940   ALKPHOS 86 01/28/2023 0940   BILITOT 0.5 01/28/2023 0940   GFRNONAA 42 (L) 01/28/2023 0940   GFRAA >60 02/26/2017 1830     Lab Results  Component Value Date   WBC 4.4 01/28/2023   NEUTROABS 2.5 01/28/2023   HGB 11.8 (L) 01/28/2023   HCT 36.6 (L) 01/28/2023   MCV 86.9 01/28/2023   PLT 153 01/28/2023       RADIOGRAPHIC STUDIES: IR IVC FILTER PLMT / S&I Lenise Arena GUID/MOD SED  Result Date: 01/06/2023 CLINICAL DATA:  82 year old male with history of venous thromboembolic disease requiring cessation of anticoagulation prior to forthcoming surgery. EXAM: 1. ULTRASOUND GUIDANCE FOR VASCULAR ACCESS OF THE RIGHT internal jugular VEIN. 2. IVC VENOGRAM. 3. PERCUTANEOUS IVC FILTER PLACEMENT. ANESTHESIA/SEDATION: Moderate (conscious) sedation was employed during this procedure. A total of Versed 1.5 mg and Fentanyl 50 mcg was administered intravenously. Moderate Sedation Time: 11 minutes. The patient's level of consciousness and vital signs were monitored continuously by radiology nursing throughout the procedure under my direct supervision. CONTRAST:  50mL OMNIPAQUE IOHEXOL 300 MG/ML  SOLN FLUOROSCOPY TIME:  Forty mGy PROCEDURE: The procedure, risks, benefits, and alternatives were explained to the patient. Questions regarding the procedure were encouraged and  answered. The patient understands and consents to the procedure. The patient was prepped with Betadine in a sterile fashion, and a sterile drape was applied covering the operative field. A sterile gown and sterile gloves were used for the procedure. Local anesthesia was provided with 1% Lidocaine. Under direct ultrasound guidance, a 21 gauge needle was advanced into the right internal jugular vein with ultrasound image documentation performed. After securing access with a micropuncture dilator, a guidewire was advanced into the inferior vena cava. A deployment sheath was advanced over the guidewire. This was utilized to perform IVC venography. The deployment sheath was further positioned in an appropriate location for filter deployment. An Option Elite IVC filter was then advanced in the sheath. This was then fully deployed in the infrarenal IVC. Final filter position was confirmed with a fluoroscopic spot image. Contrast injection was also performed through the sheath under fluoroscopy to confirm patency of the IVC at the level of the  filter. After the procedure the sheath was removed and hemostasis obtained with manual compression. COMPLICATIONS: None. FINDINGS: IVC venography demonstrates a normal caliber IVC with no evidence of thrombus. Renal veins are identified bilaterally. The IVC filter was successfully positioned below the level of the renal veins and is appropriately oriented. This IVC filter has both permanent and retrievable indications. IMPRESSION: Placement of percutaneous IVC filter in infrarenal IVC. IVC venogram shows no evidence of IVC thrombus and normal caliber of the inferior vena cava. This filter does have both permanent and retrievable indications. PLAN: This IVC filter is potentially retrievable. The patient will be assessed for filter retrieval by Interventional Radiology in approximately 8-12 weeks. Further recommendations regarding filter retrieval, continued surveillance or declaration  of device permanence, will be made at that time. Marliss Coots, MD [facility] Radiology Electronically Signed   By: Marliss Coots M.D.   On: 01/06/2023 15:54    ADDENDUM I have seen the patient, examined him. I agree with the assessment and and plan and have edited the notes.   Pt underwent robotic assisted left hemicolectomy on January 10, 2023.  I reviewed his surgical pathology findings, which reviewed a T4 tumor with negative margins, all nodes were negative. MMR normal. I discussed the risk of recurrence due to the T4 lesion, and the benefit of adjuvant chemotherapy.  Due to his advanced age, I recommend Xeloda alone for 3 months.  I also discussed option of Signatera to check circulating tumor DNA, and avoid adjuvant chemotherapy if that is negative.  Due to his advanced age, it is also reasonable for cancer surveillance only.  After lengthy discussion, patient opted adjuvant Xeloda, chemo consent obtained.  I will slightly reduce chemo dose due to his age, medication called into [facility] pharmacy today, plan to start on Monday, November 16.  Will follow-up on second week for toxicity checkup.  All questions were answered.  I spent a total of 40 minutes for his visit today, more than 50% time on face-to-face counseling.  Malachy Mood MD 01/28/2023

## 2023-01-26 NOTE — Assessment & Plan Note (Signed)
Currently on Eliquis 5 mg twice daily.  Did have IVC filter placed during surgery. -Referral to IR to remove IVC filter.  Patient will remain on Eliquis 5 mg twice daily.

## 2023-01-27 ENCOUNTER — Other Ambulatory Visit: Payer: Self-pay

## 2023-01-27 DIAGNOSIS — K6389 Other specified diseases of intestine: Secondary | ICD-10-CM

## 2023-01-27 DIAGNOSIS — I2699 Other pulmonary embolism without acute cor pulmonale: Secondary | ICD-10-CM

## 2023-01-28 ENCOUNTER — Telehealth: Payer: Self-pay | Admitting: Pharmacy Technician

## 2023-01-28 ENCOUNTER — Encounter: Payer: Self-pay | Admitting: Nurse Practitioner

## 2023-01-28 ENCOUNTER — Inpatient Hospital Stay: Payer: Medicare HMO | Attending: Nurse Practitioner

## 2023-01-28 ENCOUNTER — Other Ambulatory Visit (HOSPITAL_COMMUNITY): Payer: Self-pay

## 2023-01-28 ENCOUNTER — Telehealth: Payer: Self-pay | Admitting: Pharmacist

## 2023-01-28 ENCOUNTER — Inpatient Hospital Stay: Payer: Medicare HMO | Admitting: Nurse Practitioner

## 2023-01-28 VITALS — BP 116/72 | HR 60 | Temp 97.3°F | Resp 17 | Wt 177.1 lb

## 2023-01-28 DIAGNOSIS — I7121 Aneurysm of the ascending aorta, without rupture: Secondary | ICD-10-CM | POA: Insufficient documentation

## 2023-01-28 DIAGNOSIS — Z86711 Personal history of pulmonary embolism: Secondary | ICD-10-CM | POA: Diagnosis not present

## 2023-01-28 DIAGNOSIS — R918 Other nonspecific abnormal finding of lung field: Secondary | ICD-10-CM | POA: Insufficient documentation

## 2023-01-28 DIAGNOSIS — I700 Atherosclerosis of aorta: Secondary | ICD-10-CM | POA: Insufficient documentation

## 2023-01-28 DIAGNOSIS — Z7901 Long term (current) use of anticoagulants: Secondary | ICD-10-CM | POA: Insufficient documentation

## 2023-01-28 DIAGNOSIS — N400 Enlarged prostate without lower urinary tract symptoms: Secondary | ICD-10-CM | POA: Insufficient documentation

## 2023-01-28 DIAGNOSIS — I2699 Other pulmonary embolism without acute cor pulmonale: Secondary | ICD-10-CM | POA: Diagnosis not present

## 2023-01-28 DIAGNOSIS — C184 Malignant neoplasm of transverse colon: Secondary | ICD-10-CM | POA: Diagnosis not present

## 2023-01-28 DIAGNOSIS — Z79899 Other long term (current) drug therapy: Secondary | ICD-10-CM | POA: Insufficient documentation

## 2023-01-28 DIAGNOSIS — K6389 Other specified diseases of intestine: Secondary | ICD-10-CM

## 2023-01-28 DIAGNOSIS — K8020 Calculus of gallbladder without cholecystitis without obstruction: Secondary | ICD-10-CM | POA: Insufficient documentation

## 2023-01-28 LAB — CBC WITH DIFFERENTIAL (CANCER CENTER ONLY)
Abs Immature Granulocytes: 0.01 10*3/uL (ref 0.00–0.07)
Basophils Absolute: 0 10*3/uL (ref 0.0–0.1)
Basophils Relative: 1 %
Eosinophils Absolute: 0.2 10*3/uL (ref 0.0–0.5)
Eosinophils Relative: 6 %
HCT: 36.6 % — ABNORMAL LOW (ref 39.0–52.0)
Hemoglobin: 11.8 g/dL — ABNORMAL LOW (ref 13.0–17.0)
Immature Granulocytes: 0 %
Lymphocytes Relative: 27 %
Lymphs Abs: 1.2 10*3/uL (ref 0.7–4.0)
MCH: 28 pg (ref 26.0–34.0)
MCHC: 32.2 g/dL (ref 30.0–36.0)
MCV: 86.9 fL (ref 80.0–100.0)
Monocytes Absolute: 0.4 10*3/uL (ref 0.1–1.0)
Monocytes Relative: 10 %
Neutro Abs: 2.5 10*3/uL (ref 1.7–7.7)
Neutrophils Relative %: 56 %
Platelet Count: 153 10*3/uL (ref 150–400)
RBC: 4.21 MIL/uL — ABNORMAL LOW (ref 4.22–5.81)
RDW: 13.3 % (ref 11.5–15.5)
WBC Count: 4.4 10*3/uL (ref 4.0–10.5)
nRBC: 0 % (ref 0.0–0.2)

## 2023-01-28 LAB — CMP (CANCER CENTER ONLY)
ALT: 11 U/L (ref 0–44)
AST: 19 U/L (ref 15–41)
Albumin: 4.2 g/dL (ref 3.5–5.0)
Alkaline Phosphatase: 86 U/L (ref 38–126)
Anion gap: 10 (ref 5–15)
BUN: 23 mg/dL (ref 8–23)
CO2: 22 mmol/L (ref 22–32)
Calcium: 9.9 mg/dL (ref 8.9–10.3)
Chloride: 106 mmol/L (ref 98–111)
Creatinine: 1.61 mg/dL — ABNORMAL HIGH (ref 0.61–1.24)
GFR, Estimated: 42 mL/min — ABNORMAL LOW (ref >60)
Glucose, Bld: 81 mg/dL (ref 70–99)
Potassium: 4.4 mmol/L (ref 3.5–5.1)
Sodium: 138 mmol/L (ref 135–145)
Total Bilirubin: 0.5 mg/dL (ref <1.2)
Total Protein: 7.4 g/dL (ref 6.5–8.1)

## 2023-01-28 LAB — IRON AND IRON BINDING CAPACITY (CC-WL,HP ONLY)
Iron: 89 ug/dL (ref 45–182)
Saturation Ratios: 26 % (ref 17.9–39.5)
TIBC: 346 ug/dL (ref 250–450)
UIBC: 257 ug/dL (ref 117–376)

## 2023-01-28 LAB — CEA (ACCESS): CEA (CHCC): 4.34 ng/mL (ref 0.00–5.00)

## 2023-01-28 LAB — FERRITIN: Ferritin: 55 ng/mL (ref 24–336)

## 2023-01-28 MED ORDER — CAPECITABINE 500 MG PO TABS: ORAL_TABLET | ORAL | 0 refills | Status: DC

## 2023-01-28 MED ORDER — CAPECITABINE 500 MG PO TABS
ORAL_TABLET | ORAL | 0 refills | Status: DC
  Filled 2023-01-30: qty 84, 28d supply, fill #0

## 2023-01-28 NOTE — Telephone Encounter (Signed)
Oral Oncology Patient Advocate Encounter  After completing a benefits investigation, prior authorization for cpaecitabine is not required at this time through [facility].  Patient's copay is $492.84.    Patient is eligible for cash pricing at [facility]. Cost for a 21 day cycle would be $60  Jinger Neighbors, CPhT-Adv Oncology Pharmacy Patient Advocate [facility] Direct Number: [phone]  Fax: [phone]

## 2023-01-28 NOTE — Telephone Encounter (Signed)
Oral Oncology Pharmacist Encounter  Received new prescription for Xeloda (capecitabine) for the adjuvant treatment of colon cancer, planned duration 3 months. Planned start date 02/10/23.  CBC w/ Diff and CMP from 01/28/23 assessed, patient with Scr of 1.61 mg/dL (CrCl ~82 mL/min) - Xeloda dose has been dose reduced 25% accordingly. Prescription dose and frequency assessed for appropriateness.  Current medication list in Epic reviewed, no relevant/significant DDIs with Xeloda identified.  Evaluated chart and no patient barriers to medication adherence noted.   Prescription has been e-scribed to the [employer] for benefits analysis and approval.  Oral Oncology Clinic will continue to follow for insurance authorization, copayment issues, initial counseling and start date.  Sherry Ruffing, PharmD, BCPS, BCOP Hematology/Oncology Clinical Pharmacist [facility] Oral Chemotherapy Navigation Clinics [phone] 01/28/2023 1:03 PM

## 2023-01-30 ENCOUNTER — Other Ambulatory Visit (HOSPITAL_COMMUNITY): Payer: Self-pay

## 2023-01-30 ENCOUNTER — Other Ambulatory Visit: Payer: Self-pay | Admitting: Pharmacy Technician

## 2023-01-30 ENCOUNTER — Other Ambulatory Visit: Payer: Self-pay

## 2023-01-30 ENCOUNTER — Telehealth: Payer: Self-pay | Admitting: Nurse Practitioner

## 2023-01-30 NOTE — Progress Notes (Signed)
Oral Chemotherapy Pharmacist Encounter  Patient was counseled under telephone encounter from 01/28/23.  Sherry Ruffing, PharmD, BCPS, BCOP Hematology/Oncology Clinical Pharmacist [facility] Oral Chemotherapy Navigation Clinics [phone] 01/30/2023 12:09 PM

## 2023-01-30 NOTE — Telephone Encounter (Addendum)
Oral Chemotherapy Pharmacist Encounter  I spoke with patient's daughter, Frederick Burnett, for overview of: Xeloda (capecitabine) for the adjuvant treatment of colon cancer, planned duration 3 months.   Counseled patient's daughter on administration, dosing, side effects, monitoring, drug-food interactions, safe handling, storage, and disposal.  Patient will take Xeloda 500mg  tablets, 3 tablets (1500mg ) by mouth in AM and 3 tabs (1500mg ) by mouth in PM, within 30 minutes of finishing meals, for 14 days on, 7 days off, repeated every 21 days.  Xeloda start date: 02/10/23  Adverse effects include but are not limited to: fatigue, decreased blood counts, GI upset, diarrhea, mouth sores, and hand-foot syndrome. Hand-foot syndrome: discussed use of cream such as [place]derly Smooth Extra Care 20 or equivalent advanced care cream that has 20% urea content for advanced skin hydration while on Xeloda Diarrhea: Patient will obtain Imodium (loperamide) to have on hand if they experience diarrhea. Patient knows to alert the office of 4 or more loose stools above baseline.  Reviewed importance of keeping a medication schedule and plan for any missed doses. No barriers to medication adherence identified.  Medication reconciliation performed and medication/allergy list updated.  All questions answered.  Patient's daughter, Frederick Burnett, voiced understanding and appreciation.   Medication education handout and medication calendar placed in mail for patient and patient's family. Patient's daughter knows to call the office with questions or concerns. Oral Chemotherapy Clinic phone number provided.   Sherry Ruffing, PharmD, BCPS, BCOP Hematology/Oncology Clinical Pharmacist [facility] Oral Chemotherapy Navigation Clinics [phone] 01/30/2023 11:49 AM

## 2023-01-30 NOTE — Progress Notes (Signed)
Specialty Pharmacy Initial Fill Coordination Note  BLAYTON FARE is a 82 y.o. male contacted today regarding refills of specialty medication(s) Capecitabine .  Patient requested Daryll Drown at [facility] [place]  on 02/03/23   Medication will be filled on 01/31/23.   Patient is aware of $60 copayment.

## 2023-01-31 ENCOUNTER — Other Ambulatory Visit: Payer: Self-pay

## 2023-01-31 ENCOUNTER — Telehealth: Payer: Self-pay

## 2023-01-31 NOTE — Telephone Encounter (Signed)
Voicemail left today for patient's daughter regarding enrollment for topical diclofenac study (IRB [phone]). She was instructed to call back to inquire further for additional enrollment details.   Jerry Caras, PharmD PGY2 Oncology Pharmacy Resident  Principal Investigator  [phone]  01/31/2023 1:30 PM

## 2023-02-03 ENCOUNTER — Other Ambulatory Visit: Payer: Self-pay

## 2023-02-03 DIAGNOSIS — C184 Malignant neoplasm of transverse colon: Secondary | ICD-10-CM

## 2023-02-03 NOTE — Progress Notes (Signed)
Research Note - Consent Authorization   Study Name: Evaluation of Topical Diclofenac Use on the Incidence and Severity of Hand Foot Syndrome in Patients with Breast and Gastrointestinal Malignancies Taking Capecitabine   IRB# [id number]   A summary of the study was presented to the patient and his daughter (Diane), including potential risks and benefits from study participation, alternatives treatment options available other than study participation, how their confidentiality will be maintained, and who to contact if they had questions regarding their rights as a study participant or if they experience an injury associated with their participation. They were told that participation is completely voluntary and choosing not to participate would not impact care they would otherwise have access to. They were also told that even if they choose to participate, they can withdraw their consent to participate at any time in the future. Any questions asked were addressed by a member of the research team.   After having all of their questions answered, they agreed to participate in the study by confirming their willingness to consent to participation verbally over the phone on 02/03/2023 at 5:08 PM.    Jerry Caras, PharmD PGY2 Oncology Pharmacy Resident  Principal Investigator  [phone]

## 2023-02-03 NOTE — Telephone Encounter (Signed)
Second voicemail left today patient's daughter regarding enrollment for topical diclofenac study (IRB [phone]). She was instructed to call back to inquire further for additional enrollment details.   Jerry Caras, PharmD PGY2 Oncology Pharmacy Resident  Principal Investigator  [phone]  02/03/2023 3:40 PM

## 2023-02-05 ENCOUNTER — Other Ambulatory Visit (HOSPITAL_COMMUNITY): Payer: Self-pay

## 2023-02-05 MED ORDER — DICLOFENAC SODIUM 1 % EX GEL
CUTANEOUS | 0 refills | Status: AC
  Filled 2023-02-05: qty 400, 140d supply, fill #0
  Filled 2023-02-11: qty 400, 84d supply, fill #0

## 2023-02-10 ENCOUNTER — Other Ambulatory Visit (HOSPITAL_COMMUNITY): Payer: Self-pay

## 2023-02-11 ENCOUNTER — Other Ambulatory Visit (HOSPITAL_COMMUNITY): Payer: Self-pay

## 2023-02-11 ENCOUNTER — Other Ambulatory Visit: Payer: Self-pay

## 2023-02-18 ENCOUNTER — Other Ambulatory Visit: Payer: Self-pay

## 2023-02-20 ENCOUNTER — Other Ambulatory Visit: Payer: Self-pay

## 2023-02-20 DIAGNOSIS — C184 Malignant neoplasm of transverse colon: Secondary | ICD-10-CM

## 2023-02-21 ENCOUNTER — Inpatient Hospital Stay: Payer: Medicare HMO | Admitting: Nurse Practitioner

## 2023-02-21 ENCOUNTER — Inpatient Hospital Stay: Payer: Medicare HMO

## 2023-02-21 ENCOUNTER — Telehealth: Payer: Self-pay | Admitting: Pharmacy Technician

## 2023-02-21 ENCOUNTER — Telehealth: Payer: Self-pay

## 2023-02-21 ENCOUNTER — Other Ambulatory Visit (HOSPITAL_COMMUNITY): Payer: Self-pay

## 2023-02-21 NOTE — Telephone Encounter (Signed)
Called patient due to not showing up for his lab appointment. Patient stated he forgot he had an appointment with Herbert Seta NP today. Told patient scheduling will get back in touch with him to reschedule. I no showed patient for todays appointment.

## 2023-02-21 NOTE — Telephone Encounter (Signed)
Oral Oncology Patient Advocate Encounter  Was successful in securing patient a $10,000 grant from [facility] to provide copayment coverage for capecitabine.  This will keep the out of pocket expense at $0.     [employer] ID: [id number]  I have spoken with the patient.   The billing information is as follows and has been shared with [facility].    RxBin: [id number] PCN: [id number] Member ID: [id number] Group ID: [id number] Dates of Eligibility: 01/22/23 through 01/21/24  Fund:  Colorectal  Jinger Neighbors, CPhT-Adv Oncology Pharmacy Patient Advocate [facility] Direct Number: [phone]  Fax: [phone]

## 2023-02-21 NOTE — Assessment & Plan Note (Deleted)
01/10/2023 - robotic assisted partial colectomy. -Cytology confirmed moderately differentiated, pT4a pN0.  This is a stage IIb colon cancer. 01/15/2023 - case was discussed via multidisciplinary tumor board.   -Postsurgical treatment with Xeloda versus observation alone. -01/28/2023 -patient has opted to try Xeloda daily for next 3 months.  Will see back in 3 weeks for toxicity check and follow-up.

## 2023-02-21 NOTE — Progress Notes (Deleted)
Patient Care Team: Irven Coe, MD as PCP - General (Family Medicine) Nahser, Deloris Ping, MD as PCP - Cardiology (Cardiology) Malachy Mood, MD as Consulting Physician (Hematology and Oncology)  Clinic Day:  02/21/2023  Referring physician: Irven Coe, MD  ASSESSMENT & PLAN:   Assessment & Plan: Cancer of transverse colon (HCC) 01/10/2023 - robotic assisted partial colectomy. -Cytology confirmed moderately differentiated, pT4a pN0.  This is a stage IIb colon cancer. 01/15/2023 - case was discussed via multidisciplinary tumor board.   -Postsurgical treatment with Xeloda versus observation alone. -01/28/2023 -patient has opted to try Xeloda daily for next 3 months.  Will see back in 3 weeks for toxicity check and follow-up.    The patient understands the plans discussed today and is in agreement with them.  He knows to contact our office if he develops concerns prior to his next appointment.  I provided *** minutes of face-to-face time during this encounter and > 50% was spent counseling as documented under my assessment and plan.    Frederick Jews, NP  [facility] - A DEPT OF [facility] [address] [place] [address] Dept: [phone] Dept Fax: [phone]   No orders of the defined types were placed in this encounter.     CHIEF COMPLAINT:  CC: Cancer of transverse colon. History of multiple pulmonary emboli.  Current Treatment:  eliquis 5 mg twice daily. Xeloda started 01/28/2023  INTERVAL HISTORY:  Frederick Burnett is here today for repeat clinical assessment.  Was last seen by myself on 01/28/2023.  Started Xeloda.  Plan is for 3 months with new CT CAP for restaging/effectiveness of treatment.  Today he presents for toxicity check after starting his Xeloda.  He denies fevers or chills. He denies pain. His appetite is good. His weight {Weight change:10426}.  I have reviewed the past medical history, past  surgical history, social history and family history with the patient and they are unchanged from previous note.  ALLERGIES:  has no known allergies.  MEDICATIONS:  Current Outpatient Medications  Medication Sig Dispense Refill   apixaban (ELIQUIS) 5 MG TABS tablet Take 1 tablet (5 mg total) by mouth 2 (two) times daily. 60 tablet 6   atorvastatin (LIPITOR) 20 MG tablet Take 1 tablet (20 mg total) by mouth daily. Please schedule appointment for future refills. 3rd and final attempt. Thank you (Patient taking differently: Take 20 mg by mouth at bedtime.) 15 tablet 0   capecitabine (XELODA) 500 MG tablet Take 3 tablets (1500 mg total) by mouth 2 (two) times daily within 30 minutes after meals. Take for 14 days, then off 7 days. Repeat every 21 days. Start on 02/10/2023 84 tablet 0   diclofenac Sodium (VOLTAREN) 1 % GEL Research Patient: Apply 0.5 grams (1 fingertip) to each hand and each foot twice daily for up to 12 weeks 400 g 0   lisinopril-hydrochlorothiazide (ZESTORETIC) 10-12.5 MG tablet Take 1 tablet by mouth daily.     metoprolol tartrate (LOPRESSOR) 25 MG tablet Take 1 tablet (25 mg total) by mouth 2 (two) times daily. 60 tablet 3   Multiple Vitamins-Minerals (CENTRUM SILVER 50+MEN) TABS Take 1 tablet by mouth daily.     spironolactone (ALDACTONE) 25 MG tablet Take 1 tablet (25 mg total) by mouth daily. 90 tablet 2   traMADol (ULTRAM) 50 MG tablet Take 1-2 tablets (50-100 mg total) by mouth every 6 (six) hours as needed for moderate pain (pain score  4-6). 30 tablet 0   No current facility-administered medications for this visit.    HISTORY OF PRESENT ILLNESS:   Oncology History  Cancer of transverse colon (HCC)  09/03/2022 Procedure   Colonoscopy Findings: -Large, fungating mass 80 cm proximal to anus and descending colon.  Partially circumferential, involving two thirds of the colon lumen. -Several small polyps which were removed. -Medium sized lipoma in sigmoid colon -Internal  hemorrhoids   10/14/2022 Imaging   CT abdomen and pelvis with contrast  IMPRESSION: 1. Apparent central filling defect within right lower lobe posterior segmental pulmonary artery, which may represent a pulmonary embolism. Recommend correlation with PE protocol CTA chest. 2. Mildly featureless appearance of the splenic flexure with suspected mural thickening may correspond to known descending colon mass. 3. No evidence of metastatic disease in the abdomen or pelvis. 4. Cholelithiasis. 5. Enlargement of the prostate with median lobe hypertrophy. 6.  Aortic Atherosclerosis    10/15/2022 Imaging   CT chest angio with contrast  IMPRESSION: 1. Study is positive for large burden of occlusive and nonocclusive pulmonary embolism in the lungs bilaterally, probable clot in the right ventricle and CT evidence of right heart strain (RV/LV Ratio = 1.93) consistent with at least submassive (intermediate risk) PE. The presence of right heart strain has been associated with an increased risk of morbidity and mortality. Please refer to the "Code PE Focused" order set in EPIC. 2. Small pulmonary nodules measuring 6 mm or less in size. Non-contrast chest CT at 6 months is recommended. If the nodules are stable at time of repeat CT, then future CT at 18-24 months (from today's scan) is considered optional for low-risk patients, but is recommended for high-risk patients. This recommendation follows the consensus statement: Guidelines for Management of Incidental Pulmonary Nodules Detected on CT Images: From the Fleischner Society 2017; Radiology 2017; 284:228-243. 3. Aortic atherosclerosis, in addition to left main and three-vessel coronary artery disease. Please note that although the presence of coronary artery calcium documents the presence of coronary artery disease, the severity of this disease and any potential stenosis cannot be assessed on this non-gated CT examination. Assessment for potential risk factor  modification, dietary therapy or pharmacologic therapy may be warranted, if clinically indicated. 4. Aneurysmal dilatation of the ascending thoracic aorta (4.9 cm in diameter). Recommend semi-annual imaging followup by CTA or MRA and referral to cardiothoracic surgery if not already obtained. This recommendation follows 2010 ACCF/AHA/AATS/ACR/ASA/SCA/SCAI/SIR/STS/SVM Guidelines for the Diagnosis and Management of Patients With Thoracic Aortic Disease. Circulation. 2010; 121: F621-H086. Aortic aneurysm NOS    01/10/2023 Initial Diagnosis   Cancer of transverse colon (HCC)   01/10/2023 Pathology Results   Colonic adenocarcinoma, 5.8 cm Carcinoma focally involves visceral peritoneum (pT4a) Negative for lymphovascular involvement Nineteen lymph nodes negative for metastatic carcinoma (0/19) (pN0) Two separate tubular adenomas All margins negative for carcinoma See oncology table  ONCOLOGY TABLE:   COLON AND RECTUM, CARCINOMA:  Resection Procedure: Partial colectomy Tumor Site: Splenic flexure Tumor Size: 5.8 x 3.5 x 1.3 cm Macroscopic Tumor Perforation: Not identified Macroscopic Evaluation of Mesorectum (required for rectal cancer): Not applicable Histologic Type: Colonic adenocarcinoma Histologic Grade: Moderately differentiated, G2 Multiple Primary Sites: Not applicable Tumor Extension: Into pericolonic connective tissue and focally involves visceral peritoneum Lymphovascular Invasion: Not identified Perineural Invasion: Not identified Treatment Effect: No known presurgical therapy Margins:      Margin Status for Invasive Carcinoma: All margins negative for invasive carcinoma Regional Lymph Nodes:      Number of Lymph Nodes with  Tumor: 0      Number of Lymph Nodes Examined: 19 Tumor Deposits: Not identified Distant Metastasis:      Distant Site(s) Involved: Not applicable Pathologic Stage Classification (pTNM, AJCC 8th Edition): pT4a, pN0 Ancillary Studies: MMR and MSI studies  are pending Representative Tumor Block: A3-A7 (v4.2.0.1)    01/10/2023 Surgery   Robotic assisted partial colectomy on 01/10/2023.  Cytology confirmed moderately differentiated, pT4a pN0.        REVIEW OF SYSTEMS:   Constitutional: Denies fevers, chills or abnormal weight loss Eyes: Denies blurriness of vision Ears, nose, mouth, throat, and face: Denies mucositis or sore throat Respiratory: Denies cough, dyspnea or wheezes Cardiovascular: Denies palpitation, chest discomfort or lower extremity swelling Gastrointestinal:  Denies nausea, heartburn or change in bowel habits Skin: Denies abnormal skin rashes Lymphatics: Denies new lymphadenopathy or easy bruising Neurological:Denies numbness, tingling or new weaknesses Behavioral/Psych: Mood is stable, no new changes  All other systems were reviewed with the patient and are negative.   VITALS:  There were no vitals taken for this visit.  Wt Readings from Last 3 Encounters:  01/28/23 177 lb 1.6 oz (80.3 kg)  01/12/23 194 lb 0.1 oz (88 kg)  01/08/23 187 lb (84.8 kg)    There is no height or weight on file to calculate BMI.  Performance status (ECOG): {CHL ONC [id number]  PHYSICAL EXAM:   GENERAL:alert, no distress and comfortable SKIN: skin color, texture, turgor are normal, no rashes or significant lesions EYES: normal, Conjunctiva are pink and non-injected, sclera clear OROPHARYNX:no exudate, no erythema and lips, buccal mucosa, and tongue normal  NECK: supple, thyroid normal size, non-tender, without nodularity LYMPH:  no palpable lymphadenopathy in the cervical, axillary or inguinal LUNGS: clear to auscultation and percussion with normal breathing effort HEART: regular rate & rhythm and no murmurs and no lower extremity edema ABDOMEN:abdomen soft, non-tender and normal bowel sounds Musculoskeletal:no cyanosis of digits and no clubbing  NEURO: alert & oriented x 3 with fluent speech, no focal motor/sensory  deficits  LABORATORY DATA:  I have reviewed the data as listed    Component Value Date/Time   NA 138 01/28/2023 0940   NA 140 04/24/2022 1419   K 4.4 01/28/2023 0940   CL 106 01/28/2023 0940   CO2 22 01/28/2023 0940   GLUCOSE 81 01/28/2023 0940   BUN 23 01/28/2023 0940   BUN 13 04/24/2022 1419   CREATININE 1.61 (H) 01/28/2023 0940   CREATININE 1.16 06/23/2015 1159   CALCIUM 9.9 01/28/2023 0940   PROT 7.4 01/28/2023 0940   ALBUMIN 4.2 01/28/2023 0940   AST 19 01/28/2023 0940   ALT 11 01/28/2023 0940   ALKPHOS 86 01/28/2023 0940   BILITOT 0.5 01/28/2023 0940   GFRNONAA 42 (L) 01/28/2023 0940   GFRAA >60 02/26/2017 1830    No results found for: "SPEP", "UPEP"  Lab Results  Component Value Date   WBC 4.4 01/28/2023   NEUTROABS 2.5 01/28/2023   HGB 11.8 (L) 01/28/2023   HCT 36.6 (L) 01/28/2023   MCV 86.9 01/28/2023   PLT 153 01/28/2023      Chemistry      Component Value Date/Time   NA 138 01/28/2023 0940   NA 140 04/24/2022 1419   K 4.4 01/28/2023 0940   CL 106 01/28/2023 0940   CO2 22 01/28/2023 0940   BUN 23 01/28/2023 0940   BUN 13 04/24/2022 1419   CREATININE 1.61 (H) 01/28/2023 0940   CREATININE 1.16 06/23/2015 1159  Component Value Date/Time   CALCIUM 9.9 01/28/2023 0940   ALKPHOS 86 01/28/2023 0940   AST 19 01/28/2023 0940   ALT 11 01/28/2023 0940   BILITOT 0.5 01/28/2023 0940       RADIOGRAPHIC STUDIES: I have personally reviewed the radiological images as listed and agreed with the findings in the report. No results found.

## 2023-02-25 ENCOUNTER — Other Ambulatory Visit (HOSPITAL_COMMUNITY): Payer: Self-pay

## 2023-02-27 ENCOUNTER — Other Ambulatory Visit: Payer: Self-pay

## 2023-02-27 ENCOUNTER — Other Ambulatory Visit: Payer: Self-pay | Admitting: Hematology

## 2023-02-27 DIAGNOSIS — C184 Malignant neoplasm of transverse colon: Secondary | ICD-10-CM

## 2023-02-27 MED ORDER — CAPECITABINE 500 MG PO TABS
ORAL_TABLET | ORAL | 0 refills | Status: DC
  Filled 2023-02-27: qty 84, 21d supply, fill #0

## 2023-02-27 NOTE — Progress Notes (Signed)
 Specialty Pharmacy Ongoing Clinical Assessment Note  Frederick Burnett is a 83 y.o. male who is being followed by the specialty pharmacy service for RxSp Oncology   Patient's specialty medication(s) reviewed today: Capecitabine  (XELODA )   Missed doses in the last 4 weeks: 0   Patient/Caregiver did not have any additional questions or concerns.   Therapeutic benefit summary: Unable to assess   Adverse events/side effects summary: No adverse events/side effects   Patient's therapy is appropriate to: Continue    Goals Addressed             This Visit's Progress    Stabilization of disease       Patient is on track. Patient will maintain adherence         Follow up:  3 months  [name] Specialty Pharmacist

## 2023-02-27 NOTE — Progress Notes (Signed)
 Specialty Pharmacy Refill Coordination Note  Frederick Burnett is a 83 y.o. male contacted today regarding refills of specialty medication(s) Capecitabine  (XELODA )   Patient requested Delivery   Delivery date: 03/03/23   Verified address: No data recorded  Medication will be filled on 02/28/23. Pending Refill Request.

## 2023-02-28 ENCOUNTER — Other Ambulatory Visit (HOSPITAL_COMMUNITY): Payer: Self-pay

## 2023-02-28 ENCOUNTER — Other Ambulatory Visit: Payer: Self-pay

## 2023-03-03 NOTE — Progress Notes (Signed)
  Cardiology Office Note:  .   Date:  03/04/2023  ID:  Frederick Burnett, DOB 1940/09/22, MRN [id number] PCP: Leonel Cole, MD  [facility] Providers Cardiologist:  previous Claudene Sorrow  now Micha Dosanjh  Click to update primary MD,subspecialty MD or APP then REFRESH:1}   History of Present Illness: . Sept, 4, 2024   Frederick Burnett is a 83 y.o. male of bicuspid AV, s/p AVR with bioprosthetic AV in 2016, HTN, HLD , chronic systolic CHF .  CAD  CABG ETTER Fellers, 2016)  Previously seen by Dr. Claudene,   He was recently found to have a colonic mass  Needs pre op evaluation prior to surgery    He was last seen by Dr. Sorrow in Feb. 2024 Denied any chest pain  BP has been well controlled.    Was found to have a pulmonary embolus by CTA of the lungs on August 19. He was also found to have a mass in his descending colon.  He needs to have preoperative evaluation prior to endoscopy/colonoscopy and possible surgery.  Breathing has been ok  Able to do his normal activities  Mows grass, does some yard work   Echocardiogram from October 15, 2022 reveals normal left ventricular systolic function with a EF of 60 to 65%.  He has severe left ventricular hypertrophy of his basal septal segment.  He has grade 1 diastolic dysfunction. RV is mildly enlarged. Mild aortic stenosis with an mean aortic valve gradient of 11.  Jan. 7, 2025 Frederick Burnett is seen for follow up of his CAD, pulmonary embolus Feels ok ,  no Cp , no dyspnea Still on eliquis  for his PE     ROS:   Studies Reviewed: .            Risk Assessment/Calculations:      Physical Exam:     Physical Exam: Blood pressure 98/68, pulse 77, height 5' 9 (1.753 m), weight 183 lb 9.6 oz (83.3 kg), SpO2 93%.       GEN:  Well nourished, well developed in no acute distress HEENT: Normal NECK: No JVD; No carotid bruits LYMPHATICS: No lymphadenopathy CARDIAC: RRR , no murmurs, rubs, gallops RESPIRATORY:  Clear to auscultation  without rales, wheezing or rhonchi  ABDOMEN: Soft, non-tender, non-distended MUSCULOSKELETAL:  No edema; No deformity .  Has a large lipoma on the right back  SKIN: Warm and dry NEUROLOGIC:  Alert and oriented x 3    ASSESSMENT AND PLAN: .       Colon cancer :  per oncology     2.  Pulmonary embolus: cont eliquis   No symptoms    3.  CAD / CABG :   no angina , check lipids today         Dispo:   Signed, Aleene Passe, MD

## 2023-03-04 ENCOUNTER — Ambulatory Visit: Payer: Medicare HMO | Attending: Cardiovascular Disease | Admitting: Cardiovascular Disease

## 2023-03-04 ENCOUNTER — Other Ambulatory Visit (HOSPITAL_COMMUNITY): Payer: Self-pay

## 2023-03-04 ENCOUNTER — Encounter: Payer: Self-pay | Admitting: Cardiovascular Disease

## 2023-03-04 VITALS — BP 98/68 | HR 77 | Ht 69.0 in | Wt 183.6 lb

## 2023-03-04 DIAGNOSIS — I2510 Atherosclerotic heart disease of native coronary artery without angina pectoris: Secondary | ICD-10-CM | POA: Diagnosis not present

## 2023-03-04 DIAGNOSIS — E785 Hyperlipidemia, unspecified: Secondary | ICD-10-CM

## 2023-03-04 NOTE — Patient Instructions (Signed)
 Medication Instructions:  Your physician recommends that you continue on your current medications as directed. Please refer to the Current Medication list given to you today.  *If you need a refill on your cardiac medications before your next appointment, please call your pharmacy*  Lab Work: TODAY: Lipid panel, ALT, BMP If you have labs (blood work) drawn today and your tests are completely normal, you will receive your results only by: MyChart Message (if you have MyChart) OR A paper copy in the mail If you have any lab test that is abnormal or we need to change your treatment, we will call you to review the results.  Testing/Procedures: None ordered today.  Follow-Up: At [facility], you and your health needs are our priority.  As part of our continuing mission to provide you with exceptional heart care, we have created designated Provider Care Teams.  These Care Teams include your primary Cardiologist (physician) and Advanced Practice Providers (APPs -  Physician Assistants and Nurse Practitioners) who all work together to provide you with the care you need, when you need it.  Your next appointment:   1 year(s)  The format for your next appointment:   In Person  Provider:   Vinie Maxcy, MD

## 2023-03-11 ENCOUNTER — Other Ambulatory Visit (HOSPITAL_COMMUNITY): Payer: Self-pay

## 2023-03-17 ENCOUNTER — Other Ambulatory Visit (HOSPITAL_COMMUNITY): Payer: Self-pay

## 2023-03-17 ENCOUNTER — Other Ambulatory Visit: Payer: Self-pay

## 2023-03-17 ENCOUNTER — Other Ambulatory Visit: Payer: Self-pay | Admitting: Hematology

## 2023-03-17 DIAGNOSIS — C184 Malignant neoplasm of transverse colon: Secondary | ICD-10-CM

## 2023-03-17 MED ORDER — CAPECITABINE 500 MG PO TABS
ORAL_TABLET | ORAL | 0 refills | Status: DC
  Filled 2023-03-17: qty 84, 21d supply, fill #0

## 2023-03-17 NOTE — Progress Notes (Signed)
Specialty Pharmacy Refill Coordination Note  Frederick Burnett is a 83 y.o. male contacted today regarding refills of specialty medication(s) Capecitabine (XELODA)   Patient requested Delivery   Delivery date: 03/20/23   Verified address: Patient address [address]  [place] [address]   Medication will be filled on 01.22.25.     Refill request pending

## 2023-04-01 ENCOUNTER — Other Ambulatory Visit: Payer: Self-pay | Admitting: Family Medicine

## 2023-04-01 DIAGNOSIS — R911 Solitary pulmonary nodule: Secondary | ICD-10-CM

## 2023-04-03 ENCOUNTER — Other Ambulatory Visit (HOSPITAL_COMMUNITY): Payer: Self-pay | Admitting: Pharmacy Technician

## 2023-04-03 ENCOUNTER — Other Ambulatory Visit: Payer: Self-pay

## 2023-04-03 ENCOUNTER — Other Ambulatory Visit: Payer: Self-pay | Admitting: Hematology

## 2023-04-03 ENCOUNTER — Other Ambulatory Visit (HOSPITAL_COMMUNITY): Payer: Self-pay

## 2023-04-03 DIAGNOSIS — C184 Malignant neoplasm of transverse colon: Secondary | ICD-10-CM

## 2023-04-03 MED ORDER — CAPECITABINE 500 MG PO TABS
ORAL_TABLET | ORAL | 0 refills | Status: AC
  Filled 2023-04-03: qty 84, 21d supply, fill #0

## 2023-04-03 NOTE — Progress Notes (Signed)
 Specialty Pharmacy Refill Coordination Note  Frederick Burnett is a 83 y.o. male contacted today regarding refills of specialty medication(s) Capecitabine  (XELODA )   Patient requested Delivery   Delivery date: 04/08/23   Verified address: Patient address [address]  [place]   Medication will be filled on 04/07/23.

## 2023-04-07 ENCOUNTER — Other Ambulatory Visit: Payer: Self-pay

## 2023-04-07 DIAGNOSIS — R053 Chronic cough: Secondary | ICD-10-CM | POA: Diagnosis not present

## 2023-04-07 DIAGNOSIS — H04203 Unspecified epiphora, bilateral lacrimal glands: Secondary | ICD-10-CM | POA: Diagnosis not present

## 2023-04-07 DIAGNOSIS — R0989 Other specified symptoms and signs involving the circulatory and respiratory systems: Secondary | ICD-10-CM | POA: Diagnosis not present

## 2023-04-08 DIAGNOSIS — H524 Presbyopia: Secondary | ICD-10-CM | POA: Diagnosis not present

## 2023-04-08 DIAGNOSIS — H52223 Regular astigmatism, bilateral: Secondary | ICD-10-CM | POA: Diagnosis not present

## 2023-04-08 DIAGNOSIS — H5202 Hypermetropia, left eye: Secondary | ICD-10-CM | POA: Diagnosis not present

## 2023-04-08 DIAGNOSIS — H5211 Myopia, right eye: Secondary | ICD-10-CM | POA: Diagnosis not present

## 2023-04-18 ENCOUNTER — Other Ambulatory Visit: Payer: Self-pay

## 2023-04-24 ENCOUNTER — Ambulatory Visit
Admission: RE | Admit: 2023-04-24 | Discharge: 2023-04-24 | Disposition: A | Discharge status: Home / Self Care | Payer: Medicare HMO | Source: Ambulatory Visit | Attending: Family Medicine | Admitting: Family Medicine

## 2023-04-24 DIAGNOSIS — R911 Solitary pulmonary nodule: Secondary | ICD-10-CM

## 2023-04-29 ENCOUNTER — Other Ambulatory Visit: Payer: Self-pay

## 2023-04-29 DIAGNOSIS — E7800 Pure hypercholesterolemia, unspecified: Secondary | ICD-10-CM | POA: Diagnosis not present

## 2023-04-29 DIAGNOSIS — I10 Essential (primary) hypertension: Secondary | ICD-10-CM | POA: Diagnosis not present

## 2023-05-02 ENCOUNTER — Other Ambulatory Visit: Payer: Self-pay | Admitting: Surgery

## 2023-05-02 ENCOUNTER — Other Ambulatory Visit: Payer: Self-pay

## 2023-05-02 DIAGNOSIS — I712 Thoracic aortic aneurysm, without rupture, unspecified: Secondary | ICD-10-CM

## 2023-05-15 ENCOUNTER — Ambulatory Visit (HOSPITAL_COMMUNITY): Payer: Medicare HMO | Attending: Cardiovascular Disease

## 2023-05-15 DIAGNOSIS — I340 Nonrheumatic mitral (valve) insufficiency: Secondary | ICD-10-CM | POA: Insufficient documentation

## 2023-05-15 DIAGNOSIS — Z952 Presence of prosthetic heart valve: Secondary | ICD-10-CM | POA: Diagnosis not present

## 2023-05-15 LAB — ECHOCARDIOGRAM COMPLETE
AR max vel: 1.08 cm2
AV Area VTI: 1.02 cm2
AV Area mean vel: 1.03 cm2
AV Mean grad: 13 mmHg
AV Peak grad: 23.3 mmHg
Ao pk vel: 2.42 m/s
Area-P 1/2: 3.16 cm2
S' Lateral: 2.5 cm

## 2023-05-16 ENCOUNTER — Encounter: Payer: Self-pay | Admitting: Cardiovascular Disease

## 2023-05-19 ENCOUNTER — Telehealth: Payer: Self-pay | Admitting: Hematology

## 2023-05-19 ENCOUNTER — Other Ambulatory Visit (HOSPITAL_COMMUNITY): Payer: Self-pay

## 2023-05-19 NOTE — Telephone Encounter (Signed)
 Patient is aware of scheduled appointment times/dates

## 2023-05-20 ENCOUNTER — Other Ambulatory Visit (HOSPITAL_COMMUNITY): Payer: Self-pay

## 2023-05-20 ENCOUNTER — Encounter: Payer: Self-pay | Admitting: Surgery

## 2023-05-21 ENCOUNTER — Encounter: Payer: Self-pay | Admitting: Hematology

## 2023-05-21 ENCOUNTER — Inpatient Hospital Stay: Payer: Self-pay | Attending: Nurse Practitioner

## 2023-05-21 ENCOUNTER — Inpatient Hospital Stay: Payer: Self-pay | Admitting: Hematology

## 2023-05-21 VITALS — BP 134/78 | HR 65 | Temp 97.6°F | Resp 20 | Wt 179.9 lb

## 2023-05-21 DIAGNOSIS — Z9049 Acquired absence of other specified parts of digestive tract: Secondary | ICD-10-CM | POA: Insufficient documentation

## 2023-05-21 DIAGNOSIS — N189 Chronic kidney disease, unspecified: Secondary | ICD-10-CM | POA: Diagnosis not present

## 2023-05-21 DIAGNOSIS — R7989 Other specified abnormal findings of blood chemistry: Secondary | ICD-10-CM | POA: Insufficient documentation

## 2023-05-21 DIAGNOSIS — C184 Malignant neoplasm of transverse colon: Secondary | ICD-10-CM | POA: Diagnosis not present

## 2023-05-21 LAB — CBC WITH DIFFERENTIAL (CANCER CENTER ONLY)
Abs Immature Granulocytes: 0.01 10*3/uL (ref 0.00–0.07)
Basophils Absolute: 0 10*3/uL (ref 0.0–0.1)
Basophils Relative: 1 %
Eosinophils Absolute: 0.2 10*3/uL (ref 0.0–0.5)
Eosinophils Relative: 3 %
HCT: 35.8 % — ABNORMAL LOW (ref 39.0–52.0)
Hemoglobin: 11.8 g/dL — ABNORMAL LOW (ref 13.0–17.0)
Immature Granulocytes: 0 %
Lymphocytes Relative: 26 %
Lymphs Abs: 1.3 10*3/uL (ref 0.7–4.0)
MCH: 31.4 pg (ref 26.0–34.0)
MCHC: 33 g/dL (ref 30.0–36.0)
MCV: 95.2 fL (ref 80.0–100.0)
Monocytes Absolute: 0.6 10*3/uL (ref 0.1–1.0)
Monocytes Relative: 13 %
Neutro Abs: 2.9 10*3/uL (ref 1.7–7.7)
Neutrophils Relative %: 57 %
Platelet Count: 128 10*3/uL — ABNORMAL LOW (ref 150–400)
RBC: 3.76 MIL/uL — ABNORMAL LOW (ref 4.22–5.81)
RDW: 15.6 % — ABNORMAL HIGH (ref 11.5–15.5)
WBC Count: 5 10*3/uL (ref 4.0–10.5)
nRBC: 0 % (ref 0.0–0.2)

## 2023-05-21 LAB — CMP (CANCER CENTER ONLY)
ALT: 13 U/L (ref 0–44)
AST: 17 U/L (ref 15–41)
Albumin: 4 g/dL (ref 3.5–5.0)
Alkaline Phosphatase: 85 U/L (ref 38–126)
Anion gap: 8 (ref 5–15)
BUN: 20 mg/dL (ref 8–23)
CO2: 25 mmol/L (ref 22–32)
Calcium: 9.2 mg/dL (ref 8.9–10.3)
Chloride: 108 mmol/L (ref 98–111)
Creatinine: 1.53 mg/dL — ABNORMAL HIGH (ref 0.61–1.24)
GFR, Estimated: 45 mL/min — ABNORMAL LOW (ref >60)
Glucose, Bld: 83 mg/dL (ref 70–99)
Potassium: 3.8 mmol/L (ref 3.5–5.1)
Sodium: 141 mmol/L (ref 135–145)
Total Bilirubin: 0.4 mg/dL (ref 0.0–1.2)
Total Protein: 7.3 g/dL (ref 6.5–8.1)

## 2023-05-21 NOTE — Progress Notes (Signed)
 [facility]   [phone] [phone]   Clinic Follow up Note   Patient Care Team: Irven Coe, MD as PCP - General (Family Medicine) Nahser, Deloris Ping, MD as PCP - Cardiology (Cardiology) Malachy Mood, MD as Consulting Physician (Hematology and Oncology)  Date of Service:  05/21/2023  CHIEF COMPLAINT: f/u of colon cancer  CURRENT THERAPY:  Cancer surveillance   Oncology History   Cancer of transverse colon (HCC) 01/10/2023 - robotic assisted partial colectomy. -pathology confirmed moderately differentiated, pT4a pN0.  This is a stage IIb colon cancer. MMR normal  -I recommended adjuvant Xeloda for 3-6 months. He took 2 cycles, did not tolerate well and stopped on his own.   Assessment and Plan    Colon Cancer (Stage II) Stage II colon cancer diagnosed in November 2024 with negative lymph nodes. Underwent surgery in November 2024. Post-surgery, started on Xeloda (capecitabine) but discontinued after two cycles due to hand-foot syndrome. No follow-up since December 2024. Currently asymptomatic with no pain. Plan to monitor disease progression with imaging due to the three-month gap since last treatment. - Repeat CT scan in three months to monitor disease progression - Provide supportive care and monitor for any new symptoms  Chronic Kidney Disease Chronic kidney disease with abnormal creatinine levels indicating impaired kidney function. Unaware of the condition. Requires monitoring and management to prevent progression. Emphasized the importance of hydration, especially in preparation for imaging with contrast. - Encourage increased water intake, especially before and after CT scan with contrast - Monitor kidney function regularly  Follow-up Follow-up plan discussed to ensure continuity of care and monitoring of conditions. Emphasized the importance of attending scheduled appointments and rescheduling if missed. - Schedule CT scan with contrast for  May 28, 2023, to evaluate for aneurysm and monitor colon cancer - Call in two weeks to review CT scan results - Schedule follow-up appointment in three months - Ensure understanding of the importance of attending scheduled appointments and rescheduling if missed  -Continue cancer surveillance        SUMMARY OF ONCOLOGIC HISTORY: Oncology History  Cancer of transverse colon (HCC)  09/03/2022 Procedure   Colonoscopy Findings: -Large, fungating mass 80 cm proximal to anus and descending colon.  Partially circumferential, involving two thirds of the colon lumen. -Several small polyps which were removed. -Medium sized lipoma in sigmoid colon -Internal hemorrhoids   10/14/2022 Imaging   CT abdomen and pelvis with contrast  IMPRESSION: 1. Apparent central filling defect within right lower lobe posterior segmental pulmonary artery, which may represent a pulmonary embolism. Recommend correlation with PE protocol CTA chest. 2. Mildly featureless appearance of the splenic flexure with suspected mural thickening may correspond to known descending colon mass. 3. No evidence of metastatic disease in the abdomen or pelvis. 4. Cholelithiasis. 5. Enlargement of the prostate with median lobe hypertrophy. 6.  Aortic Atherosclerosis    10/15/2022 Imaging   CT chest angio with contrast  IMPRESSION: 1. Study is positive for large burden of occlusive and nonocclusive pulmonary embolism in the lungs bilaterally, probable clot in the right ventricle and CT evidence of right heart strain (RV/LV Ratio = 1.93) consistent with at least submassive (intermediate risk) PE. The presence of right heart strain has been associated with an increased risk of morbidity and mortality. Please refer to the "Code PE Focused" order set in EPIC. 2. Small pulmonary nodules measuring 6 mm or less in size. Non-contrast chest CT at 6 months is recommended. If the nodules are stable at  time of repeat CT, then future CT at 18-24  months (from today's scan) is considered optional for low-risk patients, but is recommended for high-risk patients. This recommendation follows the consensus statement: Guidelines for Management of Incidental Pulmonary Nodules Detected on CT Images: From the Fleischner Society 2017; Radiology 2017; 284:228-243. 3. Aortic atherosclerosis, in addition to left main and three-vessel coronary artery disease. Please note that although the presence of coronary artery calcium documents the presence of coronary artery disease, the severity of this disease and any potential stenosis cannot be assessed on this non-gated CT examination. Assessment for potential risk factor modification, dietary therapy or pharmacologic therapy may be warranted, if clinically indicated. 4. Aneurysmal dilatation of the ascending thoracic aorta (4.9 cm in diameter). Recommend semi-annual imaging followup by CTA or MRA and referral to cardiothoracic surgery if not already obtained. This recommendation follows 2010 ACCF/AHA/AATS/ACR/ASA/SCA/SCAI/SIR/STS/SVM Guidelines for the Diagnosis and Management of Patients With Thoracic Aortic Disease. Circulation. 2010; 121: Z610-R604. Aortic aneurysm NOS    01/10/2023 Initial Diagnosis   Cancer of transverse colon (HCC)   01/10/2023 Pathology Results   Colonic adenocarcinoma, 5.8 cm Carcinoma focally involves visceral peritoneum (pT4a) Negative for lymphovascular involvement Nineteen lymph nodes negative for metastatic carcinoma (0/19) (pN0) Two separate tubular adenomas All margins negative for carcinoma See oncology table  ONCOLOGY TABLE:   COLON AND RECTUM, CARCINOMA:  Resection Procedure: Partial colectomy Tumor Site: Splenic flexure Tumor Size: 5.8 x 3.5 x 1.3 cm Macroscopic Tumor Perforation: Not identified Macroscopic Evaluation of Mesorectum (required for rectal cancer): Not applicable Histologic Type: Colonic adenocarcinoma Histologic Grade: Moderately differentiated,  G2 Multiple Primary Sites: Not applicable Tumor Extension: Into pericolonic connective tissue and focally involves visceral peritoneum Lymphovascular Invasion: Not identified Perineural Invasion: Not identified Treatment Effect: No known presurgical therapy Margins:      Margin Status for Invasive Carcinoma: All margins negative for invasive carcinoma Regional Lymph Nodes:      Number of Lymph Nodes with Tumor: 0      Number of Lymph Nodes Examined: 19 Tumor Deposits: Not identified Distant Metastasis:      Distant Site(s) Involved: Not applicable Pathologic Stage Classification (pTNM, AJCC 8th Edition): pT4a, pN0 Ancillary Studies: MMR and MSI studies are pending Representative Tumor Block: A3-A7 (v4.2.0.1)    01/10/2023 Surgery   Robotic assisted partial colectomy on 01/10/2023.  Cytology confirmed moderately differentiated, pT4a pN0.    01/10/2023 Cancer Staging   Staging form: Colon and Rectum, AJCC 8th Edition - Pathologic stage from 01/10/2023: Stage IIB (pT4a, pN0, cM0) - Signed by Malachy Mood, MD on 05/21/2023 Total positive nodes: 0 Residual tumor (R): R0      Discussed the use of AI scribe software for clinical note transcription with the patient, who gave verbal consent to proceed.  History of Present Illness   The patient, with a history of stage 2 colon cancer, presents for follow-up after surgery in November 2024. He was prescribed Xeloda as part of his treatment plan. However, he reports discontinuing the medication after experiencing skin peeling on his hands, a known side effect of the drug. He did not follow up as scheduled after discontinuing the medication. He also reports a new symptom of a lump in his left breast, which he noticed after his colon surgery. He denies any history of breast cancer.         All other systems were reviewed with the patient and are negative.  MEDICAL HISTORY:  Past Medical History:  Diagnosis Date   Aortic regurgitation  Aortic root enlargement (HCC)    Arthritis    both shoulders - RC- wear   Heart murmur    History of pulmonary embolism 04/24/2014   Provoked; occurred after AVR in 2016   HTN (hypertension)    Hx of echocardiogram    Echo 3/16: mild LVH, EF 40-45%, ant-septal AK, AVR with elevated velocity (3.3 m/s - slightly above expected value), mild LAE //  Echo 3/17: Mild LVH, EF 40%, diffuse HK, indeterminate diastolic function, bioprosthetic AVR well-functioning (mean gradient 9 mmHg), MAC, mild LAE, mildly reduced RVSF, mild RAE   Hypercholesteremia     SURGICAL HISTORY: Past Surgical History:  Procedure Laterality Date   AORTIC VALVE REPLACEMENT N/A 04/11/2014   Procedure: AORTIC VALVE REPLACEMENT (AVR);  Surgeon: Alleen Borne, MD;  Location: [facility] OR;  Service: Open Heart Surgery;  Laterality: N/A;   CARDIAC CATHETERIZATION     IR IVC FILTER PLMT / S&I /IMG GUID/MOD SED  01/06/2023   LEFT AND RIGHT HEART CATHETERIZATION WITH CORONARY ANGIOGRAM N/A 02/22/2014   Procedure: LEFT AND RIGHT HEART CATHETERIZATION WITH CORONARY ANGIOGRAM;  Surgeon: Lesleigh Noe, MD;  Location: [facility] CATH LAB;  Service: Cardiovascular;  Laterality: N/A;   MULTIPLE TOOTH EXTRACTIONS     TEE WITHOUT CARDIOVERSION N/A 03/24/2014   Procedure: TRANSESOPHAGEAL ECHOCARDIOGRAM (TEE);  Surgeon: Lars Masson, MD;  Location: [facility] ENDOSCOPY;  Service: Cardiovascular;  Laterality: N/A;   TEE WITHOUT CARDIOVERSION N/A 04/11/2014   Procedure: TRANSESOPHAGEAL ECHOCARDIOGRAM (TEE);  Surgeon: Alleen Borne, MD;  Location: [facility] OR;  Service: Open Heart Surgery;  Laterality: N/A;    I have reviewed the social history and family history with the patient and they are unchanged from previous note.  ALLERGIES:  has no known allergies.  MEDICATIONS:  Current Outpatient Medications  Medication Sig Dispense Refill   apixaban (ELIQUIS) 5 MG TABS tablet Take 1 tablet (5 mg total) by mouth 2 (two) times daily. 60 tablet 6   atorvastatin  (LIPITOR) 20 MG tablet Take 1 tablet (20 mg total) by mouth daily. Please schedule appointment for future refills. 3rd and final attempt. Thank you (Patient taking differently: Take 20 mg by mouth at bedtime.) 15 tablet 0   capecitabine (XELODA) 500 MG tablet Take 3 tablets (1500 mg total) by mouth 2 (two) times daily within 30 minutes after meals. Take for 14 days, then off 7 days. Repeat every 21 days. Start on 02/10/2023 84 tablet 0   diclofenac Sodium (VOLTAREN) 1 % GEL Research Patient: Apply 0.5 grams (1 fingertip) to each hand and each foot twice daily for up to 12 weeks (Patient not taking: Reported on 03/04/2023) 400 g 0   Iron Combinations (IRON COMPLEX PO) Take 65 mg by mouth daily.     lisinopril-hydrochlorothiazide (ZESTORETIC) 10-12.5 MG tablet Take 1 tablet by mouth daily.     metoprolol tartrate (LOPRESSOR) 25 MG tablet Take 1 tablet (25 mg total) by mouth 2 (two) times daily. 60 tablet 3   Multiple Vitamins-Minerals (CENTRUM SILVER 50+MEN) TABS Take 1 tablet by mouth daily.     spironolactone (ALDACTONE) 25 MG tablet Take 1 tablet (25 mg total) by mouth daily. 90 tablet 2   traMADol (ULTRAM) 50 MG tablet Take 1-2 tablets (50-100 mg total) by mouth every 6 (six) hours as needed for moderate pain (pain score 4-6). (Patient not taking: Reported on 03/04/2023) 30 tablet 0   No current facility-administered medications for this visit.    PHYSICAL EXAMINATION: ECOG PERFORMANCE STATUS: 0 -  Asymptomatic  Vitals:   05/21/23 1302  BP: 134/78  Pulse: 65  Resp: 20  Temp: 97.6 F (36.4 C)  SpO2: 100%   Wt Readings from Last 3 Encounters:  05/21/23 179 lb 14.4 oz (81.6 kg)  03/04/23 183 lb 9.6 oz (83.3 kg)  01/28/23 177 lb 1.6 oz (80.3 kg)     GENERAL:alert, no distress and comfortable SKIN: skin color, texture, turgor are normal, no rashes or significant lesions EYES: normal, Conjunctiva are pink and non-injected, sclera clear NECK: supple, thyroid normal size, non-tender, without  nodularity LYMPH:  no palpable lymphadenopathy in the cervical, axillary  LUNGS: clear to auscultation and percussion with normal breathing effort HEART: regular rate & rhythm and no murmurs and no lower extremity edema ABDOMEN:abdomen soft, non-tender and normal bowel sounds Musculoskeletal:no cyanosis of digits and no clubbing  NEURO: alert & oriented x 3 with fluent speech, no focal motor/sensory deficits   LABORATORY DATA:  I have reviewed the data as listed    Latest Ref Rng & Units 05/21/2023   12:32 PM 01/28/2023    9:40 AM 01/12/2023    5:03 AM  CBC  WBC 4.0 - 10.5 K/uL 5.0  4.4  5.4   Hemoglobin 13.0 - 17.0 g/dL 40.9  81.1  91.4   Hematocrit 39.0 - 52.0 % 35.8  36.6  32.1   Platelets 150 - 400 K/uL 128  153  122         Latest Ref Rng & Units 05/21/2023   12:32 PM 01/28/2023    9:40 AM 01/12/2023    5:03 AM  CMP  Glucose 70 - 99 mg/dL 83  81    BUN 8 - 23 mg/dL 20  23    Creatinine 7.82 - 1.24 mg/dL 9.56  2.13  0.86   Sodium 135 - 145 mmol/L 141  138    Potassium 3.5 - 5.1 mmol/L 3.8  4.4  4.2   Chloride 98 - 111 mmol/L 108  106    CO2 22 - 32 mmol/L 25  22    Calcium 8.9 - 10.3 mg/dL 9.2  9.9    Total Protein 6.5 - 8.1 g/dL 7.3  7.4    Total Bilirubin 0.0 - 1.2 mg/dL 0.4  0.5    Alkaline Phos 38 - 126 U/L 85  86    AST 15 - 41 U/L 17  19    ALT 0 - 44 U/L 13  11        RADIOGRAPHIC STUDIES: I have personally reviewed the radiological images as listed and agreed with the findings in the report. No results found.    Orders Placed This Encounter  Procedures   CT ABDOMEN PELVIS W CONTRAST    Standing Status:   Future    Expected Date:   05/28/2023    Expiration Date:   05/20/2024    Scheduling Instructions:     Schedule at same time with his CTA chest    If indicated for the ordered procedure, I authorize the administration of contrast media per Radiology protocol:   Yes    Does the patient have a contrast media/X-ray dye allergy?:   No    Preferred  imaging location?:   [facility]    If indicated for the ordered procedure, I authorize the administration of oral contrast media per Radiology protocol:   Yes   All questions were answered. The patient knows to call the clinic with any problems, questions or concerns. No barriers  to learning was detected. The total time spent in the appointment was 25 minutes.     Malachy Mood, MD 05/21/2023

## 2023-05-21 NOTE — Assessment & Plan Note (Addendum)
 01/10/2023 - robotic assisted partial colectomy. -pathology confirmed moderately differentiated, pT4a pN0.  This is a stage IIb colon cancer. MMR normal  -I recommended adjuvant Xeloda for 3-6 months. He took 2 cycles, did not tolerate well and stopped on his own.

## 2023-05-22 ENCOUNTER — Other Ambulatory Visit (HOSPITAL_COMMUNITY): Payer: Self-pay

## 2023-05-22 NOTE — Progress Notes (Signed)
 Patient decided to discontinue Xeloda treatment on his own, Dr. Mosetta Putt is aware and discussed with patient at 05/21/23 office visit.  Disenrolling from Specialty Pharmacy services.

## 2023-05-23 ENCOUNTER — Telehealth: Payer: Self-pay | Admitting: Hematology

## 2023-05-23 NOTE — Telephone Encounter (Signed)
 Scheduled appointments per 3/28 los. Talked with the patient and he is aware of the made appointments.

## 2023-05-28 ENCOUNTER — Telehealth: Payer: Self-pay | Admitting: *Deleted

## 2023-05-28 ENCOUNTER — Ambulatory Visit
Admission: RE | Admit: 2023-05-28 | Discharge: 2023-05-28 | Disposition: A | Discharge status: Home / Self Care | Source: Ambulatory Visit | Attending: Surgery | Admitting: Surgery

## 2023-05-28 ENCOUNTER — Other Ambulatory Visit: Payer: Self-pay | Admitting: Hematology

## 2023-05-28 ENCOUNTER — Ambulatory Visit
Admission: RE | Admit: 2023-05-28 | Discharge: 2023-05-28 | Disposition: A | Discharge status: Home / Self Care | Source: Ambulatory Visit | Attending: Hematology | Admitting: Hematology

## 2023-05-28 ENCOUNTER — Other Ambulatory Visit: Payer: Self-pay | Admitting: Surgery

## 2023-05-28 DIAGNOSIS — K8020 Calculus of gallbladder without cholecystitis without obstruction: Secondary | ICD-10-CM | POA: Diagnosis not present

## 2023-05-28 DIAGNOSIS — N200 Calculus of kidney: Secondary | ICD-10-CM | POA: Diagnosis not present

## 2023-05-28 DIAGNOSIS — N400 Enlarged prostate without lower urinary tract symptoms: Secondary | ICD-10-CM | POA: Diagnosis not present

## 2023-05-28 DIAGNOSIS — I712 Thoracic aortic aneurysm, without rupture, unspecified: Secondary | ICD-10-CM

## 2023-05-28 DIAGNOSIS — C184 Malignant neoplasm of transverse colon: Secondary | ICD-10-CM

## 2023-05-28 DIAGNOSIS — I7121 Aneurysm of the ascending aorta, without rupture: Secondary | ICD-10-CM | POA: Diagnosis not present

## 2023-05-28 NOTE — Telephone Encounter (Signed)
 [facility] called stating that several attempts to insert IV was made with no success and pt is declining to be stuck again. Though ideal for contrast, verbal order from Dr. Mosetta Putt, OK to proceed without contrast

## 2023-06-04 ENCOUNTER — Other Ambulatory Visit: Payer: Self-pay | Admitting: Interventional Radiology

## 2023-06-04 ENCOUNTER — Encounter: Payer: Self-pay | Admitting: Surgery

## 2023-06-04 ENCOUNTER — Ambulatory Visit: Admitting: Surgery

## 2023-06-04 DIAGNOSIS — Z86718 Personal history of other venous thrombosis and embolism: Secondary | ICD-10-CM

## 2023-06-04 DIAGNOSIS — Z95828 Presence of other vascular implants and grafts: Secondary | ICD-10-CM

## 2023-06-05 ENCOUNTER — Inpatient Hospital Stay: Attending: Nurse Practitioner | Admitting: Hematology

## 2023-06-05 DIAGNOSIS — D72819 Decreased white blood cell count, unspecified: Secondary | ICD-10-CM | POA: Diagnosis not present

## 2023-06-05 DIAGNOSIS — Z86711 Personal history of pulmonary embolism: Secondary | ICD-10-CM | POA: Diagnosis not present

## 2023-06-05 DIAGNOSIS — Z79899 Other long term (current) drug therapy: Secondary | ICD-10-CM | POA: Diagnosis not present

## 2023-06-05 DIAGNOSIS — C184 Malignant neoplasm of transverse colon: Secondary | ICD-10-CM | POA: Insufficient documentation

## 2023-06-05 NOTE — Progress Notes (Signed)
 [facility]   Telephone:([phone] Fax:(336) [phone]   Clinic Follow up Note   Patient Care Team: Irven Coe, MD as PCP - General (Family Medicine) Nahser, Deloris Ping, MD as PCP - Cardiology (Cardiology) Malachy Mood, MD as Consulting Physician (Hematology and Oncology)  Date of Service:  06/05/2023  CHIEF COMPLAINT: f/u of colon cancer  CURRENT THERAPY:  Surveillance  Oncology History   Cancer of transverse colon (HCC) 01/10/2023 - robotic assisted partial colectomy. -pathology confirmed moderately differentiated, pT4a pN0.  This is a stage IIb colon cancer. MMR normal  -I recommended adjuvant Xeloda for 3-6 months. He took 2 cycles, did not tolerate well and stopped on his own.  -Surveillance CT scan from May 28, 2023 was negative for disease recurrence.   Assessment & Plan Surveillance for colon cancer Stage II colon cancer post-surgery with no recurrence on recent CT scan. - Schedule follow-up appointment for August 21, 2023.  Thoracic aortic aneurysm CT scan revealed thoracic aortic aneurysm requiring monitoring due to age and risk factors. - Monitor with future scans.  Plan -I personally reviewed his surveillance CT scan, and discussed the findings with patient in detail. -Lab and follow-up in 2 to 3 months as scheduled.   SUMMARY OF ONCOLOGIC HISTORY: Oncology History  Cancer of transverse colon (HCC)  09/03/2022 Procedure   Colonoscopy Findings: -Large, fungating mass 80 cm proximal to anus and descending colon.  Partially circumferential, involving two thirds of the colon lumen. -Several small polyps which were removed. -Medium sized lipoma in sigmoid colon -Internal hemorrhoids   10/14/2022 Imaging   CT abdomen and pelvis with contrast  IMPRESSION: 1. Apparent central filling defect within right lower lobe posterior segmental pulmonary artery, which may represent a pulmonary embolism. Recommend correlation with PE protocol CTA chest. 2.  Mildly featureless appearance of the splenic flexure with suspected mural thickening may correspond to known descending colon mass. 3. No evidence of metastatic disease in the abdomen or pelvis. 4. Cholelithiasis. 5. Enlargement of the prostate with median lobe hypertrophy. 6.  Aortic Atherosclerosis    10/15/2022 Imaging   CT chest angio with contrast  IMPRESSION: 1. Study is positive for large burden of occlusive and nonocclusive pulmonary embolism in the lungs bilaterally, probable clot in the right ventricle and CT evidence of right heart strain (RV/LV Ratio = 1.93) consistent with at least submassive (intermediate risk) PE. The presence of right heart strain has been associated with an increased risk of morbidity and mortality. Please refer to the "Code PE Focused" order set in EPIC. 2. Small pulmonary nodules measuring 6 mm or less in size. Non-contrast chest CT at 6 months is recommended. If the nodules are stable at time of repeat CT, then future CT at 18-24 months (from today's scan) is considered optional for low-risk patients, but is recommended for high-risk patients. This recommendation follows the consensus statement: Guidelines for Management of Incidental Pulmonary Nodules Detected on CT Images: From the Fleischner Society 2017; Radiology 2017; 284:228-243. 3. Aortic atherosclerosis, in addition to left main and three-vessel coronary artery disease. Please note that although the presence of coronary artery calcium documents the presence of coronary artery disease, the severity of this disease and any potential stenosis cannot be assessed on this non-gated CT examination. Assessment for potential risk factor modification, dietary therapy or pharmacologic therapy may be warranted, if clinically indicated. 4. Aneurysmal dilatation of the ascending thoracic aorta (4.9 cm in diameter). Recommend semi-annual imaging followup by CTA or MRA and referral to cardiothoracic  surgery if not already  obtained. This recommendation follows 2010 ACCF/AHA/AATS/ACR/ASA/SCA/SCAI/SIR/STS/SVM Guidelines for the Diagnosis and Management of Patients With Thoracic Aortic Disease. Circulation. 2010; 121: Z610-R604. Aortic aneurysm NOS    01/10/2023 Initial Diagnosis   Cancer of transverse colon (HCC)   01/10/2023 Pathology Results   Colonic adenocarcinoma, 5.8 cm Carcinoma focally involves visceral peritoneum (pT4a) Negative for lymphovascular involvement Nineteen lymph nodes negative for metastatic carcinoma (0/19) (pN0) Two separate tubular adenomas All margins negative for carcinoma See oncology table  ONCOLOGY TABLE:   COLON AND RECTUM, CARCINOMA:  Resection Procedure: Partial colectomy Tumor Site: Splenic flexure Tumor Size: 5.8 x 3.5 x 1.3 cm Macroscopic Tumor Perforation: Not identified Macroscopic Evaluation of Mesorectum (required for rectal cancer): Not applicable Histologic Type: Colonic adenocarcinoma Histologic Grade: Moderately differentiated, G2 Multiple Primary Sites: Not applicable Tumor Extension: Into pericolonic connective tissue and focally involves visceral peritoneum Lymphovascular Invasion: Not identified Perineural Invasion: Not identified Treatment Effect: No known presurgical therapy Margins:      Margin Status for Invasive Carcinoma: All margins negative for invasive carcinoma Regional Lymph Nodes:      Number of Lymph Nodes with Tumor: 0      Number of Lymph Nodes Examined: 19 Tumor Deposits: Not identified Distant Metastasis:      Distant Site(s) Involved: Not applicable Pathologic Stage Classification (pTNM, AJCC 8th Edition): pT4a, pN0 Ancillary Studies: MMR and MSI studies are pending Representative Tumor Block: A3-A7 (v4.2.0.1)    01/10/2023 Surgery   Robotic assisted partial colectomy on 01/10/2023.  Cytology confirmed moderately differentiated, pT4a pN0.    01/10/2023 Cancer Staging   Staging form: Colon and Rectum, AJCC 8th Edition -  Pathologic stage from 01/10/2023: Stage IIB (pT4a, pN0, cM0) - Signed by Malachy Mood, MD on 05/21/2023 Total positive nodes: 0 Residual tumor (R): R0      Discussed the use of AI scribe software for clinical note transcription with the patient, who gave verbal consent to proceed.  History of Present Illness The patient, an 83 year old with a history of stage 2 colon cancer status post-surgery, was scheduled for a phone visit to discuss recent CT scan results. The patient recalls the recent visit and the ordering of the CT scan. The scan was performed as part of ongoing surveillance for cancer recurrence. The patient has no new complaints or concerns.     All other systems were reviewed with the patient and are negative.  MEDICAL HISTORY:  Past Medical History:  Diagnosis Date   Aortic regurgitation    Aortic root enlargement (HCC)    Arthritis    both shoulders - RC- wear   Heart murmur    History of pulmonary embolism 04/24/2014   Provoked; occurred after AVR in 2016   HTN (hypertension)    Hx of echocardiogram    Echo 3/16: mild LVH, EF 40-45%, ant-septal AK, AVR with elevated velocity (3.3 m/s - slightly above expected value), mild LAE //  Echo 3/17: Mild LVH, EF 40%, diffuse HK, indeterminate diastolic function, bioprosthetic AVR well-functioning (mean gradient 9 mmHg), MAC, mild LAE, mildly reduced RVSF, mild RAE   Hypercholesteremia     SURGICAL HISTORY: Past Surgical History:  Procedure Laterality Date   AORTIC VALVE REPLACEMENT N/A 04/11/2014   Procedure: AORTIC VALVE REPLACEMENT (AVR);  Surgeon: Alleen Borne, MD;  Location: [facility] OR;  Service: Open Heart Surgery;  Laterality: N/A;   CARDIAC CATHETERIZATION     IR IVC FILTER PLMT / S&I /IMG GUID/MOD SED  01/06/2023   LEFT AND RIGHT  HEART CATHETERIZATION WITH CORONARY ANGIOGRAM N/A 02/22/2014   Procedure: LEFT AND RIGHT HEART CATHETERIZATION WITH CORONARY ANGIOGRAM;  Surgeon: Lesleigh Noe, MD;  Location: [facility] CATH LAB;   Service: Cardiovascular;  Laterality: N/A;   MULTIPLE TOOTH EXTRACTIONS     TEE WITHOUT CARDIOVERSION N/A 03/24/2014   Procedure: TRANSESOPHAGEAL ECHOCARDIOGRAM (TEE);  Surgeon: Lars Masson, MD;  Location: [facility] ENDOSCOPY;  Service: Cardiovascular;  Laterality: N/A;   TEE WITHOUT CARDIOVERSION N/A 04/11/2014   Procedure: TRANSESOPHAGEAL ECHOCARDIOGRAM (TEE);  Surgeon: Alleen Borne, MD;  Location: [facility] OR;  Service: Open Heart Surgery;  Laterality: N/A;    I have reviewed the social history and family history with the patient and they are unchanged from previous note.  ALLERGIES:  has no known allergies.  MEDICATIONS:  Current Outpatient Medications  Medication Sig Dispense Refill   apixaban (ELIQUIS) 5 MG TABS tablet Take 1 tablet (5 mg total) by mouth 2 (two) times daily. 60 tablet 6   atorvastatin (LIPITOR) 20 MG tablet Take 1 tablet (20 mg total) by mouth daily. Please schedule appointment for future refills. 3rd and final attempt. Thank you (Patient taking differently: Take 20 mg by mouth at bedtime.) 15 tablet 0   capecitabine (XELODA) 500 MG tablet Take 3 tablets (1500 mg total) by mouth 2 (two) times daily within 30 minutes after meals. Take for 14 days, then off 7 days. Repeat every 21 days. Start on 02/10/2023 84 tablet 0   diclofenac Sodium (VOLTAREN) 1 % GEL Research Patient: Apply 0.5 grams (1 fingertip) to each hand and each foot twice daily for up to 12 weeks (Patient not taking: Reported on 03/04/2023) 400 g 0   Iron Combinations (IRON COMPLEX PO) Take 65 mg by mouth daily.     lisinopril-hydrochlorothiazide (ZESTORETIC) 10-12.5 MG tablet Take 1 tablet by mouth daily.     metoprolol tartrate (LOPRESSOR) 25 MG tablet Take 1 tablet (25 mg total) by mouth 2 (two) times daily. 60 tablet 3   Multiple Vitamins-Minerals (CENTRUM SILVER 50+MEN) TABS Take 1 tablet by mouth daily.     spironolactone (ALDACTONE) 25 MG tablet Take 1 tablet (25 mg total) by mouth daily. 90 tablet 2    traMADol (ULTRAM) 50 MG tablet Take 1-2 tablets (50-100 mg total) by mouth every 6 (six) hours as needed for moderate pain (pain score 4-6). (Patient not taking: Reported on 03/04/2023) 30 tablet 0   No current facility-administered medications for this visit.    PHYSICAL EXAMINATION: ECOG PERFORMANCE STATUS: 0 - Asymptomatic  There were no vitals filed for this visit. Wt Readings from Last 3 Encounters:  05/21/23 179 lb 14.4 oz (81.6 kg)  03/04/23 183 lb 9.6 oz (83.3 kg)  01/28/23 177 lb 1.6 oz (80.3 kg)     GENERAL:alert, no distress and comfortable SKIN: skin color, texture, turgor are normal, no rashes or significant lesions EYES: normal, Conjunctiva are pink and non-injected, sclera clear NECK: supple, thyroid normal size, non-tender, without nodularity LYMPH:  no palpable lymphadenopathy in the cervical, axillary  LUNGS: clear to auscultation and percussion with normal breathing effort HEART: regular rate & rhythm and no murmurs and no lower extremity edema ABDOMEN:abdomen soft, non-tender and normal bowel sounds Musculoskeletal:no cyanosis of digits and no clubbing  NEURO: alert & oriented x 3 with fluent speech, no focal motor/sensory deficits  Physical Exam    LABORATORY DATA:  I have reviewed the data as listed    Latest Ref Rng & Units 05/21/2023   12:32 PM 01/28/2023  9:40 AM 01/12/2023    5:03 AM  CBC  WBC 4.0 - 10.5 K/uL 5.0  4.4  5.4   Hemoglobin 13.0 - 17.0 g/dL 40.9  81.1  91.4   Hematocrit 39.0 - 52.0 % 35.8  36.6  32.1   Platelets 150 - 400 K/uL 128  153  122         Latest Ref Rng & Units 05/21/2023   12:32 PM 01/28/2023    9:40 AM 01/12/2023    5:03 AM  CMP  Glucose 70 - 99 mg/dL 83  81    BUN 8 - 23 mg/dL 20  23    Creatinine 7.82 - 1.24 mg/dL 9.56  2.13  0.86   Sodium 135 - 145 mmol/L 141  138    Potassium 3.5 - 5.1 mmol/L 3.8  4.4  4.2   Chloride 98 - 111 mmol/L 108  106    CO2 22 - 32 mmol/L 25  22    Calcium 8.9 - 10.3 mg/dL 9.2  9.9     Total Protein 6.5 - 8.1 g/dL 7.3  7.4    Total Bilirubin 0.0 - 1.2 mg/dL 0.4  0.5    Alkaline Phos 38 - 126 U/L 85  86    AST 15 - 41 U/L 17  19    ALT 0 - 44 U/L 13  11        RADIOGRAPHIC STUDIES: I have personally reviewed the radiological images as listed and agreed with the findings in the report. No results found.    No orders of the defined types were placed in this encounter.  All questions were answered. The patient knows to call the clinic with any problems, questions or concerns. No barriers to learning was detected. The total time spent in the appointment was 15 minutes.     Malachy Mood, MD 06/05/2023

## 2023-06-05 NOTE — Assessment & Plan Note (Signed)
 01/10/2023 - robotic assisted partial colectomy. -pathology confirmed moderately differentiated, pT4a pN0.  This is a stage IIb colon cancer. MMR normal  -I recommended adjuvant Xeloda for 3-6 months. He took 2 cycles, did not tolerate well and stopped on his own.  -Surveillance CT scan from May 28, 2023 was negative for disease recurrence.

## 2023-06-11 ENCOUNTER — Ambulatory Visit
Admission: RE | Admit: 2023-06-11 | Discharge: 2023-06-11 | Disposition: A | Discharge status: Home / Self Care | Source: Ambulatory Visit | Attending: Interventional Radiology | Admitting: Interventional Radiology

## 2023-06-11 ENCOUNTER — Other Ambulatory Visit: Payer: Self-pay

## 2023-06-11 DIAGNOSIS — Z95828 Presence of other vascular implants and grafts: Secondary | ICD-10-CM

## 2023-06-11 DIAGNOSIS — Z86718 Personal history of other venous thrombosis and embolism: Secondary | ICD-10-CM

## 2023-06-11 DIAGNOSIS — I82513 Chronic embolism and thrombosis of femoral vein, bilateral: Secondary | ICD-10-CM | POA: Diagnosis not present

## 2023-06-11 DIAGNOSIS — I82533 Chronic embolism and thrombosis of popliteal vein, bilateral: Secondary | ICD-10-CM | POA: Diagnosis not present

## 2023-06-21 NOTE — Progress Notes (Signed)
 This encounter was conducted via the [employer] providing interactive audio and visual communication.  The patient provided verbal consent to conduct a virtual appointment.  The patient was located at their primary residence during this encounter.   Referring Physician(s): Sonja [place] Duke  Chief Complaint: The patient is seen in follow up today s/p IVC filter placement 01/06/23  History of present illness:  Frederick Burnett, 83 year old male, has a medical history significant for HTN, aortic regurgitation, and pulmonary embolism with acute on chronic lower extremity DVT (Eliquis ). He also has a history of colon cancer and underwent partial colectomy 01/10/2023. This procedure required interruption of his anticoagulation and was referred to Interventional Radiology for IVC filter placement. He met with Dr. Pierre Briar in consultation 11/15/22 and he discussed the details of the procedure as well as tentative plans for filter retrieval at the appropriate time.   His procedure was performed 01/06/23 and he tolerated the procedure well. He presents today via virtual video visit to discuss possible filter retrieval. Dr. Andy Bannister performed partial colectomy on 01/10/23.  He has recovered well.  He is back on Eliquis  and tolerating it well.  No lower extremity pain or swelling.    Past Medical History:  Diagnosis Date   Aortic regurgitation    Aortic root enlargement (HCC)    Arthritis    both shoulders - RC- wear   Heart murmur    History of pulmonary embolism 04/24/2014   Provoked; occurred after AVR in 2016   HTN (hypertension)    Hx of echocardiogram    Echo 3/16: mild LVH, EF 40-45%, ant-septal AK, AVR with elevated velocity (3.3 m/s - slightly above expected value), mild LAE //  Echo 3/17: Mild LVH, EF 40%, diffuse HK, indeterminate diastolic function, bioprosthetic AVR well-functioning (mean gradient 9 mmHg), MAC, mild LAE, mildly reduced RVSF, mild RAE   Hypercholesteremia     Past Surgical  History:  Procedure Laterality Date   AORTIC VALVE REPLACEMENT N/A 04/11/2014   Procedure: AORTIC VALVE REPLACEMENT (AVR);  Surgeon: Bartley Lightning, MD;  Location: [facility] OR;  Service: Open Heart Surgery;  Laterality: N/A;   CARDIAC CATHETERIZATION     IR IVC FILTER PLMT / S&I /IMG GUID/MOD SED  01/06/2023   LEFT AND RIGHT HEART CATHETERIZATION WITH CORONARY ANGIOGRAM N/A 02/22/2014   Procedure: LEFT AND RIGHT HEART CATHETERIZATION WITH CORONARY ANGIOGRAM;  Surgeon: Mickiel Albany, MD;  Location: [facility] CATH LAB;  Service: Cardiovascular;  Laterality: N/A;   MULTIPLE TOOTH EXTRACTIONS     TEE WITHOUT CARDIOVERSION N/A 03/24/2014   Procedure: TRANSESOPHAGEAL ECHOCARDIOGRAM (TEE);  Surgeon: Liza Riggers, MD;  Location: [facility] ENDOSCOPY;  Service: Cardiovascular;  Laterality: N/A;   TEE WITHOUT CARDIOVERSION N/A 04/11/2014   Procedure: TRANSESOPHAGEAL ECHOCARDIOGRAM (TEE);  Surgeon: Bartley Lightning, MD;  Location: [facility] OR;  Service: Open Heart Surgery;  Laterality: N/A;    Allergies: Patient has no known allergies.  Medications: Prior to Admission medications   Medication Sig Start Date End Date Taking? Authorizing Provider  apixaban  (ELIQUIS ) 5 MG TABS tablet Take 1 tablet (5 mg total) by mouth 2 (two) times daily. 10/29/22   Burton, Lacie K, NP  atorvastatin  (LIPITOR) 20 MG tablet Take 1 tablet (20 mg total) by mouth daily. Please schedule appointment for future refills. 3rd and final attempt. Thank you Patient taking differently: Take 20 mg by mouth at bedtime. 08/13/21   Arty Binning, MD  capecitabine  (XELODA ) 500 MG tablet Take 3 tablets (1500 mg total) by  mouth 2 (two) times daily within 30 minutes after meals. Take for 14 days, then off 7 days. Repeat every 21 days. Start on 02/10/2023 04/03/23   Sonja Newhall, MD  diclofenac  Sodium (VOLTAREN ) 1 % GEL Research Patient: Apply 0.5 grams (1 fingertip) to each hand and each foot twice daily for up to 12 weeks Patient not taking: Reported on 03/04/2023  02/05/23   Sonja Dora, MD  Iron Combinations (IRON COMPLEX PO) Take 65 mg by mouth daily.    [provider]  lisinopril -hydrochlorothiazide  (ZESTORETIC ) 10-12.5 MG tablet Take 1 tablet by mouth daily.    [provider]  metoprolol  tartrate (LOPRESSOR ) 25 MG tablet Take 1 tablet (25 mg total) by mouth 2 (two) times daily. 04/15/14   Barrett, Erin R, PA-C  Multiple Vitamins-Minerals (CENTRUM SILVER  50+MEN) TABS Take 1 tablet by mouth daily.    [provider]  spironolactone  (ALDACTONE ) 25 MG tablet Take 1 tablet (25 mg total) by mouth daily. 01/15/23   Nahser, Lela Purple, MD  traMADol  (ULTRAM ) 50 MG tablet Take 1-2 tablets (50-100 mg total) by mouth every 6 (six) hours as needed for moderate pain (pain score 4-6). Patient not taking: Reported on 03/04/2023 01/12/23   Candyce Champagne, MD     Family History  Problem Relation Age of Onset   Hypertension Mother    Heart attack Father    Hypertension Daughter    Hypertension Sister    Hypertension Brother    Stroke Neg Hx     Social History   Socioeconomic History   Marital status: Widowed    Spouse name: Not on file   Number of children: Not on file   Years of education: Not on file   Highest education level: Not on file  Occupational History   Not on file  Tobacco Use   Smoking status: Former    Types: Cigars    Quit date: 05/29/2001    Years since quitting: 22.0   Smokeless tobacco: Never  Vaping Use   Vaping status: Never Used  Substance and Sexual Activity   Alcohol use: No    Alcohol/week: 0.0 standard drinks of alcohol   Drug use: No   Sexual activity: Not on file  Other Topics Concern   Not on file  Social History Narrative   Not on file   Social Drivers of Health   Financial Resource Strain: Not on file  Food Insecurity: No Food Insecurity (01/10/2023)   Hunger Vital Sign    Worried About Running Out of Food in the Last Year: Never true    Ran Out of Food in the Last Year: Never true   Transportation Needs: No Transportation Needs (01/10/2023)   PRAPARE - Administrator, Civil Service (Medical): No    Lack of Transportation (Non-Medical): No  Physical Activity: Not on file  Stress: Not on file  Social Connections: Not on file     Vital Signs: There were no vitals taken for this visit.  Physical Exam  Patient is alert, oriented and able to participate fully in the conversation. No apparent discomfort or distress observed. He appears appropriately dressed.    Imaging: No results found.  Labs:  CBC: Recent Labs    01/11/23 0828 01/12/23 0503 01/28/23 0940 05/21/23 1232  WBC 7.2 5.4 4.4 5.0  HGB 11.3* 10.4* 11.8* 11.8*  HCT 35.4* 32.1* 36.6* 35.8*  PLT 117* 122* 153 128*    COAGS: No results for input(s): "INR", "APTT" in the last  8760 hours.  BMP: Recent Labs    10/29/22 1400 01/06/23 1254 01/12/23 0503 01/28/23 0940 05/21/23 1232  NA 139 135  --  138 141  K 4.8 4.2 4.2 4.4 3.8  CL 107 103  --  106 108  CO2 26 25  --  22 25  GLUCOSE 95 88  --  81 83  BUN 17 11  --  23 20  CALCIUM  9.5 9.1  --  9.9 9.2  CREATININE 1.41* 1.10 1.22 1.61* 1.53*  GFRNONAA 50* >60 59* 42* 45*    LIVER FUNCTION TESTS: Recent Labs    10/14/22 2215 10/29/22 1400 01/28/23 0940 05/21/23 1232  BILITOT 1.6* 0.4 0.5 0.4  AST 53* 16 19 17   ALT 24 14 11 13   ALKPHOS 100 95 86 85  PROT 7.9 7.7 7.4 7.3  ALBUMIN  4.2 4.1 4.2 4.0    Assessment and Plan:  83 year old male with a history of colon cancer, PE and lower extremity DVTs. He underwent partial colectomy 01/10/23 which required cessation of anticoagulation pre-procedure. IVC filter was place in IR on 01/06/23.  CT Abdomen/pelvis on 05/28/23 showed slight tilt of the filter hook to the left renal vein, otherwise well positioned without evidence of occlusion. A lower extremity duplex study 06/11/23 revealed chronic, nonocclusive, bilateral femoropopliteal deep vein thrombosis which is decreased  compared to prior study 11/06/22.  He is tolerating and compliant with Eliquis .  We discussed filter retrieval and he would like to proceed.  Plan for IVC filter retrieval at [facility] with moderate sedation.     Creasie Doctor, MD Pager: [phone]    I spent a total of 25 Minutes in virtual video clinical consultation, greater than 50% of which was counseling/coordinating care s/p IVC filter placement.

## 2023-06-23 ENCOUNTER — Ambulatory Visit
Admission: RE | Admit: 2023-06-23 | Discharge: 2023-06-23 | Disposition: A | Discharge status: Home / Self Care | Source: Ambulatory Visit | Attending: Student | Admitting: Student

## 2023-06-23 ENCOUNTER — Other Ambulatory Visit: Payer: Self-pay | Admitting: Hematology

## 2023-06-23 DIAGNOSIS — Z95828 Presence of other vascular implants and grafts: Secondary | ICD-10-CM | POA: Diagnosis not present

## 2023-06-23 DIAGNOSIS — I82513 Chronic embolism and thrombosis of femoral vein, bilateral: Secondary | ICD-10-CM | POA: Diagnosis not present

## 2023-06-23 DIAGNOSIS — Z86718 Personal history of other venous thrombosis and embolism: Secondary | ICD-10-CM

## 2023-06-23 DIAGNOSIS — I2699 Other pulmonary embolism without acute cor pulmonale: Secondary | ICD-10-CM

## 2023-06-23 HISTORY — PX: IR RADIOLOGIST EVAL & MGMT: IMG5224

## 2023-06-24 ENCOUNTER — Other Ambulatory Visit: Payer: Self-pay | Admitting: Hematology

## 2023-06-24 DIAGNOSIS — I2699 Other pulmonary embolism without acute cor pulmonale: Secondary | ICD-10-CM

## 2023-06-29 ENCOUNTER — Other Ambulatory Visit: Payer: Self-pay | Admitting: Nurse Practitioner

## 2023-06-29 DIAGNOSIS — I2699 Other pulmonary embolism without acute cor pulmonale: Secondary | ICD-10-CM

## 2023-07-11 ENCOUNTER — Telehealth (HOSPITAL_COMMUNITY): Payer: Self-pay | Admitting: Family Medicine

## 2023-07-11 NOTE — Telephone Encounter (Signed)
 Pt contacted our central scheduling department with questions concerning an upcoming appointment at [facility] on 5/20. I was asked to call pt and answer questions. I did speak to patient and he had just gotten confused on his instructions that were given to him by [facility] and wanted to clarify them. He expressed understanding and agreement with the instructions for his upcoming appt. JM

## 2023-07-14 ENCOUNTER — Other Ambulatory Visit: Payer: Self-pay | Admitting: Radiology

## 2023-07-14 DIAGNOSIS — I2699 Other pulmonary embolism without acute cor pulmonale: Secondary | ICD-10-CM

## 2023-07-14 NOTE — H&P (Addendum)
 Chief Complaint: Patient was seen in consultation today for multiple pulmonary emboli, with consideration for IVC filter retrieval.  Referring Provider(s): Dr. Sonja [place], MD   Supervising Physician: Creasie Doctor  Patient Status: [facility] - Out-pt  Patient is Full Code  History of Present Illness: Frederick Burnett is a 83 y.o. male  with PMHx notable for HTN, HLD, aortic root enlargement with aortic regurgitation, bioprothetic AVR, arthritis, history of PE status post IVC filter placement, and acute on chronic lower extremity DVT.   Per Dr. Aleen Huron progress note on 4/28: "[Patient] has a history of colon cancer and underwent partial colectomy 01/10/2023. This procedure required interruption of his anticoagulation and was referred to Interventional Radiology for IVC filter placement. He met with Dr. Pierre Briar in consultation 11/15/22 and he discussed the details of the procedure as well as tentative plans for filter retrieval at the appropriate time.    His procedure was performed 01/06/23 and he tolerated the procedure well. He presents [...] to discuss possible filter retrieval. Dr. Andy Bannister performed partial colectomy on 01/10/23.  He has recovered well.  He is back on Eliquis  and tolerating it well.  No lower extremity pain or swelling."  Interventional Radiology was requested for IVC filter retrieval. Request was reviewed and approved by Dr. Jinx Mourning. Patient is scheduled for same in IR today.   Patient is alert and laying in bed, calm.  Patient is currently without any significant complaints.  Patient denies any fevers, headache, chest pain, SOB, cough, abdominal pain, nausea, vomiting or bleeding.     Past Medical History:  Diagnosis Date   Aortic regurgitation    Aortic root enlargement (HCC)    Arthritis    both shoulders - RC- wear   Heart murmur    History of pulmonary embolism 04/24/2014   Provoked; occurred after AVR in 2016   HTN (hypertension)    Hx of echocardiogram    Echo  3/16: mild LVH, EF 40-45%, ant-septal AK, AVR with elevated velocity (3.3 m/s - slightly above expected value), mild LAE //  Echo 3/17: Mild LVH, EF 40%, diffuse HK, indeterminate diastolic function, bioprosthetic AVR well-functioning (mean gradient 9 mmHg), MAC, mild LAE, mildly reduced RVSF, mild RAE   Hypercholesteremia     Past Surgical History:  Procedure Laterality Date   AORTIC VALVE REPLACEMENT N/A 04/11/2014   Procedure: AORTIC VALVE REPLACEMENT (AVR);  Surgeon: Bartley Lightning, MD;  Location: [facility] OR;  Service: Open Heart Surgery;  Laterality: N/A;   CARDIAC CATHETERIZATION     IR IVC FILTER PLMT / S&I /IMG GUID/MOD SED  01/06/2023   IR RADIOLOGIST EVAL & MGMT  06/23/2023   LEFT AND RIGHT HEART CATHETERIZATION WITH CORONARY ANGIOGRAM N/A 02/22/2014   Procedure: LEFT AND RIGHT HEART CATHETERIZATION WITH CORONARY ANGIOGRAM;  Surgeon: Mickiel Albany, MD;  Location: [facility] CATH LAB;  Service: Cardiovascular;  Laterality: N/A;   MULTIPLE TOOTH EXTRACTIONS     TEE WITHOUT CARDIOVERSION N/A 03/24/2014   Procedure: TRANSESOPHAGEAL ECHOCARDIOGRAM (TEE);  Surgeon: Liza Riggers, MD;  Location: [facility] ENDOSCOPY;  Service: Cardiovascular;  Laterality: N/A;   TEE WITHOUT CARDIOVERSION N/A 04/11/2014   Procedure: TRANSESOPHAGEAL ECHOCARDIOGRAM (TEE);  Surgeon: Bartley Lightning, MD;  Location: [facility] OR;  Service: Open Heart Surgery;  Laterality: N/A;    Allergies: Patient has no known allergies.  Medications: Prior to Admission medications   Medication Sig Start Date End Date Taking? Authorizing Provider  atorvastatin  (LIPITOR) 20 MG tablet Take 1 tablet (20 mg  total) by mouth daily. Please schedule appointment for future refills. 3rd and final attempt. Thank you Patient taking differently: Take 20 mg by mouth at bedtime. 08/13/21   Arty Binning, MD  capecitabine  (XELODA ) 500 MG tablet Take 3 tablets (1500 mg total) by mouth 2 (two) times daily within 30 minutes after meals. Take for 14 days, then off 7  days. Repeat every 21 days. Start on 02/10/2023 04/03/23   Sonja Eek, MD  diclofenac  Sodium (VOLTAREN ) 1 % [employer] Patient: Apply 0.5 grams (1 fingertip) to each hand and each foot twice daily for up to 12 weeks Patient not taking: Reported on 03/04/2023 02/05/23   Sonja Braggs, MD  ELIQUIS  5 MG TABS tablet TAKE 1 TABLET BY MOUTH TWICE A DAY 06/30/23   Burton, Lacie K, NP  Iron Combinations (IRON COMPLEX PO) Take 65 mg by mouth daily.    [provider]  lisinopril -hydrochlorothiazide  (ZESTORETIC ) 10-12.5 MG tablet Take 1 tablet by mouth daily.    [provider]  metoprolol  tartrate (LOPRESSOR ) 25 MG tablet Take 1 tablet (25 mg total) by mouth 2 (two) times daily. 04/15/14   Barrett, Erin R, PA-C  Multiple Vitamins-Minerals (CENTRUM SILVER  50+MEN) TABS Take 1 tablet by mouth daily.    [provider]  spironolactone  (ALDACTONE ) 25 MG tablet Take 1 tablet (25 mg total) by mouth daily. 01/15/23   Nahser, Lela Purple, MD  traMADol  (ULTRAM ) 50 MG tablet Take 1-2 tablets (50-100 mg total) by mouth every 6 (six) hours as needed for moderate pain (pain score 4-6). Patient not taking: Reported on 03/04/2023 01/12/23   Candyce Champagne, MD     Family History  Problem Relation Age of Onset   Hypertension Mother    Heart attack Father    Hypertension Daughter    Hypertension Sister    Hypertension Brother    Stroke Neg Hx     Social History   Socioeconomic History   Marital status: Widowed    Spouse name: Not on file   Number of children: Not on file   Years of education: Not on file   Highest education level: Not on file  Occupational History   Not on file  Tobacco Use   Smoking status: Former    Types: Cigars    Quit date: 05/29/2001    Years since quitting: 22.1    Passive exposure: Never   Smokeless tobacco: Never  Vaping Use   Vaping status: Never Used  Substance and Sexual Activity   Alcohol use: No    Alcohol/week: 0.0 standard drinks of alcohol   Drug use: No    Sexual activity: Not on file  Other Topics Concern   Not on file  Social History Narrative   Not on file   Social Drivers of Health   Financial Resource Strain: Not on file  Food Insecurity: No Food Insecurity (01/10/2023)   Hunger Vital Sign    Worried About Running Out of Food in the Last Year: Never true    Ran Out of Food in the Last Year: Never true  Transportation Needs: No Transportation Needs (01/10/2023)   PRAPARE - Administrator, Civil Service (Medical): No    Lack of Transportation (Non-Medical): No  Physical Activity: Not on file  Stress: Not on file  Social Connections: Not on file     Review of Systems: A 12 point ROS discussed and pertinent positives are indicated in the HPI above.  All other systems are negative.  Vital Signs: BP 128/76   Pulse (!) 53   Temp 97.7 F (36.5 C) (Oral)   Resp 18   Ht 5\' 9"  (1.753 m)   Wt 179 lb 14.3 oz (81.6 kg)   SpO2 100%   BMI 26.57 kg/m   Advance Care Plan: The advanced care place/surrogate decision maker was discussed at the time of visit and the patient did not wish to discuss or was not able to name a surrogate decision maker or provide an advance care plan.  Physical Exam Vitals reviewed.  Constitutional:      General: He is not in acute distress.    Appearance: Normal appearance.  HENT:     Mouth/Throat:     Mouth: Mucous membranes are moist.  Cardiovascular:     Rate and Rhythm: Normal rate and regular rhythm.     Pulses: Normal pulses.     Heart sounds: Murmur heard.  Pulmonary:     Effort: Pulmonary effort is normal. No respiratory distress.     Breath sounds: Normal breath sounds.  Abdominal:     General: Abdomen is flat.     Tenderness: There is no abdominal tenderness.  Musculoskeletal:        General: Normal range of motion.     Cervical back: Normal range of motion.  Skin:    General: Skin is warm and dry.  Neurological:     Mental Status: He is alert and oriented to person,  place, and time.  Psychiatric:        Mood and Affect: Mood normal.        Behavior: Behavior normal.        Thought Content: Thought content normal.        Judgment: Judgment normal.     Imaging: IR Radiologist Eval & Mgmt Result Date: 06/23/2023 EXAM: ESTABLISHED PATIENT OFFICE VISIT CHIEF COMPLAINT: See Epic note. HISTORY OF PRESENT ILLNESS: See Epic note. REVIEW OF SYSTEMS: See Epic note. PHYSICAL EXAMINATION: See Epic note. ASSESSMENT AND PLAN: See Epic note. Creasie Doctor, MD [facility] Radiology Electronically Signed   By: Creasie Doctor M.D.   On: 06/23/2023 15:37    Labs:  CBC: Recent Labs    01/12/23 0503 01/28/23 0940 05/21/23 1232 07/15/23 1202  WBC 5.4 4.4 5.0 3.8*  HGB 10.4* 11.8* 11.8* 12.2*  HCT 32.1* 36.6* 35.8* 38.3*  PLT 122* 153 128* 112*    COAGS: Recent Labs    07/15/23 1202  INR 1.3*    BMP: Recent Labs    01/06/23 1254 01/12/23 0503 01/28/23 0940 05/21/23 1232 07/15/23 1202  NA 135  --  138 141 137  K 4.2 4.2 4.4 3.8 3.9  CL 103  --  106 108 102  CO2 25  --  22 25 25   GLUCOSE 88  --  81 83 90  BUN 11  --  23 20 20   CALCIUM  9.1  --  9.9 9.2 9.7  CREATININE 1.10 1.22 1.61* 1.53* 1.32*  GFRNONAA >60 59* 42* 45* 54*    LIVER FUNCTION TESTS: Recent Labs    10/14/22 2215 10/29/22 1400 01/28/23 0940 05/21/23 1232  BILITOT 1.6* 0.4 0.5 0.4  AST 53* 16 19 17   ALT 24 14 11 13   ALKPHOS 100 95 86 85  PROT 7.9 7.7 7.4 7.3  ALBUMIN  4.2 4.1 4.2 4.0    TUMOR MARKERS: Recent Labs    10/29/22 1400 01/28/23 0940  CEA 6.34* 4.34    Assessment  and Plan: Per Dr. Aleen Huron progress note on 4/28: "82 year old male with a history of colon cancer, PE and lower extremity DVTs. He underwent partial colectomy 01/10/23 which required cessation of anticoagulation pre-procedure. IVC filter was place in IR on 01/06/23.  CT Abdomen/pelvis on 05/28/23 showed slight tilt of the filter hook to the left  renal vein, otherwise well positioned without evidence of occlusion. A lower extremity duplex study 06/11/23 revealed chronic, nonocclusive, bilateral femoropopliteal deep vein thrombosis which is decreased compared to prior study 11/06/22.  He is tolerating and compliant with Eliquis .   We discussed filter retrieval and he would like to proceed.   Plan for IVC filter retrieval at [facility] with moderate sedation."3   Patient presents for scheduled IVC filter retrieval in IR today.  Patient has been NPO since midnight.  All labs and medications are within acceptable parameters.  No pertinent allergies.   Risks and benefits discussed with the patient including, but not limited to bleeding, infection, contrast induced renal failure, filter fracture or migration which can lead to emergency surgery or even death, strut penetration with damage or irritation to adjacent structures and caval thrombosis.  All of the patient's questions were answered, patient is agreeable to proceed.  Consent signed and in chart.      Thank you for allowing our service to participate in AVELINO HERREN 's care.  Electronically Signed: Lovena Rubinstein, PA-C   07/15/2023, 1:03 PM      I spent a total of 25 Minutes in face to face in clinical consultation, greater than 50% of which was counseling/coordinating care for multiple pulmonary emboli, with consideration for IVC filter retrieval.

## 2023-07-15 ENCOUNTER — Other Ambulatory Visit: Payer: Self-pay

## 2023-07-15 ENCOUNTER — Ambulatory Visit (HOSPITAL_COMMUNITY)
Admission: RE | Admit: 2023-07-15 | Discharge: 2023-07-15 | Disposition: A | Discharge status: Home / Self Care | Source: Ambulatory Visit | Attending: Interventional Radiology | Admitting: Interventional Radiology

## 2023-07-15 ENCOUNTER — Encounter (HOSPITAL_COMMUNITY): Payer: Self-pay

## 2023-07-15 DIAGNOSIS — Z7901 Long term (current) use of anticoagulants: Secondary | ICD-10-CM | POA: Insufficient documentation

## 2023-07-15 DIAGNOSIS — Z4589 Encounter for adjustment and management of other implanted devices: Secondary | ICD-10-CM | POA: Insufficient documentation

## 2023-07-15 DIAGNOSIS — Z87891 Personal history of nicotine dependence: Secondary | ICD-10-CM | POA: Insufficient documentation

## 2023-07-15 DIAGNOSIS — I351 Nonrheumatic aortic (valve) insufficiency: Secondary | ICD-10-CM | POA: Diagnosis not present

## 2023-07-15 DIAGNOSIS — Z86711 Personal history of pulmonary embolism: Secondary | ICD-10-CM | POA: Insufficient documentation

## 2023-07-15 DIAGNOSIS — I10 Essential (primary) hypertension: Secondary | ICD-10-CM | POA: Insufficient documentation

## 2023-07-15 DIAGNOSIS — Z85038 Personal history of other malignant neoplasm of large intestine: Secondary | ICD-10-CM | POA: Insufficient documentation

## 2023-07-15 DIAGNOSIS — Z86718 Personal history of other venous thrombosis and embolism: Secondary | ICD-10-CM | POA: Diagnosis not present

## 2023-07-15 DIAGNOSIS — I2699 Other pulmonary embolism without acute cor pulmonale: Secondary | ICD-10-CM

## 2023-07-15 HISTORY — PX: IR IVC FILTER RETRIEVAL / S&I /IMG GUID/MOD SED: IMG5308

## 2023-07-15 LAB — BASIC METABOLIC PANEL WITH GFR
Anion gap: 10 (ref 5–15)
BUN: 20 mg/dL (ref 8–23)
CO2: 25 mmol/L (ref 22–32)
Calcium: 9.7 mg/dL (ref 8.9–10.3)
Chloride: 102 mmol/L (ref 98–111)
Creatinine, Ser: 1.32 mg/dL — ABNORMAL HIGH (ref 0.61–1.24)
GFR, Estimated: 54 mL/min — ABNORMAL LOW (ref >60)
Glucose, Bld: 90 mg/dL (ref 70–99)
Potassium: 3.9 mmol/L (ref 3.5–5.1)
Sodium: 137 mmol/L (ref 135–145)

## 2023-07-15 LAB — PROTIME-INR
INR: 1.3 — ABNORMAL HIGH (ref 0.8–1.2)
Prothrombin Time: 16.6 s — ABNORMAL HIGH (ref 11.4–15.2)

## 2023-07-15 LAB — CBC WITH DIFFERENTIAL/PLATELET
Abs Immature Granulocytes: 0.01 10*3/uL (ref 0.00–0.07)
Basophils Absolute: 0 10*3/uL (ref 0.0–0.1)
Basophils Relative: 1 %
Eosinophils Absolute: 0.1 10*3/uL (ref 0.0–0.5)
Eosinophils Relative: 4 %
HCT: 38.3 % — ABNORMAL LOW (ref 39.0–52.0)
Hemoglobin: 12.2 g/dL — ABNORMAL LOW (ref 13.0–17.0)
Immature Granulocytes: 0 %
Lymphocytes Relative: 34 %
Lymphs Abs: 1.3 10*3/uL (ref 0.7–4.0)
MCH: 30.3 pg (ref 26.0–34.0)
MCHC: 31.9 g/dL (ref 30.0–36.0)
MCV: 95.3 fL (ref 80.0–100.0)
Monocytes Absolute: 0.5 10*3/uL (ref 0.1–1.0)
Monocytes Relative: 14 %
Neutro Abs: 1.8 10*3/uL (ref 1.7–7.7)
Neutrophils Relative %: 47 %
Platelets: 112 10*3/uL — ABNORMAL LOW (ref 150–400)
RBC: 4.02 MIL/uL — ABNORMAL LOW (ref 4.22–5.81)
RDW: 12.5 % (ref 11.5–15.5)
WBC: 3.8 10*3/uL — ABNORMAL LOW (ref 4.0–10.5)
nRBC: 0 % (ref 0.0–0.2)

## 2023-07-15 MED ORDER — SODIUM CHLORIDE 0.9 % IV SOLN: INTRAVENOUS | Status: DC

## 2023-07-15 MED ORDER — LIDOCAINE HCL 1 % IJ SOLN
20.0000 mL | Freq: Once | INTRAMUSCULAR | Status: AC
Start: 2023-07-15 — End: 2023-07-15
  Administered 2023-07-15: 5 mL via INTRADERMAL

## 2023-07-15 MED ORDER — LIDOCAINE HCL 1 % IJ SOLN
INTRAMUSCULAR | Status: AC
  Filled 2023-07-15: qty 20

## 2023-07-15 MED ORDER — FENTANYL CITRATE (PF) 100 MCG/2ML IJ SOLN
INTRAMUSCULAR | Status: AC | PRN
  Administered 2023-07-15: 25 ug via INTRAVENOUS

## 2023-07-15 MED ORDER — IOHEXOL 300 MG/ML  SOLN
50.0000 mL | Freq: Once | INTRAMUSCULAR | Status: AC | PRN
  Administered 2023-07-15: 10 mL

## 2023-07-15 MED ORDER — FENTANYL CITRATE (PF) 100 MCG/2ML IJ SOLN
INTRAMUSCULAR | Status: AC
  Filled 2023-07-15: qty 2

## 2023-07-15 MED ORDER — MIDAZOLAM HCL 2 MG/2ML IJ SOLN
INTRAMUSCULAR | Status: AC
  Filled 2023-07-15: qty 2

## 2023-07-15 MED ORDER — MIDAZOLAM HCL 2 MG/2ML IJ SOLN
INTRAMUSCULAR | Status: AC | PRN
  Administered 2023-07-15: 1 mg via INTRAVENOUS

## 2023-07-15 NOTE — Discharge Instructions (Signed)
 Please call Interventional Radiology clinic [phone] with any questions or concerns.  You may remove your dressing and shower tomorrow.  After the procedure, it is common to have: Bruising that usually fades within 1-2 weeks Tenderness at the site  Follow these instructions at home:  Medication: Do not use Aspirin  or ibuprofen products, such as Advil or Motrin, as it may increase bleeding You may resume your usual medications as ordered by your doctor If your doctor prescribed antibiotics, take them as directed. Do not stop taking them just because you feel better. You need to take the full course of antibiotics  Eating and drinking: Drink plenty of liquids to keep your urine pale yellow You can resume your regular diet as directed by your doctor   Care of the procedure site Follow instructions from your health care provider about how to take care of your insertion site. Make sure you: Wash your hands with soap and water  before you change your bandage (dressing). If soap and water  are not available, use hand sanitizer Change your dressing as told by your health care provider Leave stitches (sutures), skin glue, or adhesive strips in place. These skin closures may need to stay in place for 2 weeks or longer. If adhesive strip edges start to loosen and curl up, you may trim the loose edges. Do not remove adhesive strips completely unless your health care provider tells you to do that. You may shower tomorrow Gently wash the site with plain soap and water  Pat the area dry with a clean towel Do not rub the site. This may cause bleeding Do not apply powder or lotion to the site. Keep the site clean and dry. Check your site every day for signs of infection. Check for: Redness, swelling, or pain Fluid or blood Warmth Pus or a bad smell Activity For the first 2-3 days after your procedure, or as long as directed: Avoid climbing stairs as much as possible Do not squat Do not lift  anything that is heavier than 10 lb (4.5 kg), or the limit that you are told, until your health care provider says that it is safe. Rest as directed Avoid sitting for a long time without moving. Get up to take short walks every 1-2 hours. Do not take baths, swim, or use a hot tub until your health care provider approves. Take showers only. Keep all follow-up visits as told by your doctor Contact a health care provider if you have: A fever or chills You have redness, swelling, or pain around your insertion site Get help right away if: The catheter insertion area swells very fast You pass out You suddenly start to sweat or your skin gets clammy The catheter insertion area is bleeding, and the bleeding does not stop when you hold steady pressure on the area The area near or just beyond the catheter insertion site becomes pale, cool, tingly, or numb  Moderate Conscious Sedation-Care After  This sheet gives you information about how to care for yourself after your procedure. Your health care provider may also give you more specific instructions. If you have problems or questions, contact your health care provider.  After the procedure, it is common to have: Sleepiness for several hours. Impaired judgment for several hours. Difficulty with balance. Vomiting if you eat too soon.  Follow these instructions at home:  Rest. Do not participate in activities where you could fall or become injured. Do not drive or use machinery. Do not drink alcohol. Do not take  sleeping pills or medicines that cause drowsiness. Do not make important decisions or sign legal documents. Do not take care of children on your own.  Eating and drinking Follow the diet recommended by your health care provider. Drink enough fluid to keep your urine pale yellow. If you vomit: Drink water , juice, or soup when you can drink without vomiting. Make sure you have little or no nausea before eating solid foods.  General  instructions Take over-the-counter and prescription medicines only as told by your health care provider. Have a responsible adult stay with you for the time you are told. It is important to have someone help care for you until you are awake and alert. Do not smoke. Keep all follow-up visits as told by your health care provider. This is important.  Contact a health care provider if: You are still sleepy or having trouble with balance after 24 hours. You feel light-headed. You keep feeling nauseous or you keep vomiting. You develop a rash. You have a fever. You have redness or swelling around the IV site.  Get help right away if: You have trouble breathing. You have new-onset confusion at home.  This information is not intended to replace advice given to you by your health care provider. Make sure you discuss any questions you have with your healthcare provider.

## 2023-07-15 NOTE — Procedures (Signed)
Interventional Radiology Procedure Note  Procedure: IVC filter retrieval  Findings: Please refer to procedural dictation for full description.    Complications: None immediate  Estimated Blood Loss: < 5 mL  Recommendations: Follow up with IR as needed.   Marliss Coots, MD

## 2023-08-20 ENCOUNTER — Other Ambulatory Visit: Payer: Self-pay | Admitting: Nurse Practitioner

## 2023-08-20 DIAGNOSIS — C184 Malignant neoplasm of transverse colon: Secondary | ICD-10-CM

## 2023-08-20 NOTE — Progress Notes (Deleted)
 Patient Care Team: Leonel Cole, MD as PCP - General (Family Medicine) Nahser, Aleene PARAS, MD as PCP - Cardiology (Cardiology) Lanny Callander, MD as Consulting Physician (Hematology and Oncology)  Clinic Day:  08/20/2023  Referring physician: Leonel Cole, MD  ASSESSMENT & PLAN:   Assessment & Plan: Cancer of transverse colon (HCC) 01/10/2023 - robotic assisted partial colectomy. -pathology confirmed moderately differentiated, pT4a pN0.  This is a stage IIb colon cancer. MMR normal  -Adjuvant Xeloda  for 3-6 months was recommended. He took 2 cycles, did not tolerate well and stopped on his own.  -Surveillance CT scan from May 28, 2023 was negative for disease recurrence. -due for surveillance scan     The patient understands the plans discussed today and is in agreement with them.  He knows to contact our office if he develops concerns prior to his next appointment.  I provided *** minutes of face-to-face time during this encounter and > 50% was spent counseling as documented under my assessment and plan.    Powell FORBES Lessen, NP  [facility] - A DEPT OF [facility] [address] [place] [address] Dept: [phone] Dept Fax: [phone]   No orders of the defined types were placed in this encounter.     CHIEF COMPLAINT:  CC: Cancer of transverse colon  Current Treatment: Surveillance  INTERVAL HISTORY:  Suvan is here today for repeat clinical assessment.  He was last seen by Dr. Lanny on 06/05/2023.  He denies fevers or chills. He denies pain. His appetite is good. His weight {Weight change:10426}.  I have reviewed the past medical history, past surgical history, social history and family history with the patient and they are unchanged from previous note.  ALLERGIES:  has no known allergies.  MEDICATIONS:  Current Outpatient Medications  Medication Sig Dispense Refill   atorvastatin  (LIPITOR) 20 MG  tablet Take 1 tablet (20 mg total) by mouth daily. Please schedule appointment for future refills. 3rd and final attempt. Thank you (Patient taking differently: Take 20 mg by mouth at bedtime.) 15 tablet 0   capecitabine  (XELODA ) 500 MG tablet Take 3 tablets (1500 mg total) by mouth 2 (two) times daily within 30 minutes after meals. Take for 14 days, then off 7 days. Repeat every 21 days. Start on 02/10/2023 84 tablet 0   diclofenac  Sodium (VOLTAREN ) 1 % GEL Research Patient: Apply 0.5 grams (1 fingertip) to each hand and each foot twice daily for up to 12 weeks (Patient not taking: Reported on 03/04/2023) 400 g 0   ELIQUIS  5 MG TABS tablet TAKE 1 TABLET BY MOUTH TWICE A DAY 60 tablet 5   Iron Combinations (IRON COMPLEX PO) Take 65 mg by mouth daily.     lisinopril -hydrochlorothiazide  (ZESTORETIC ) 10-12.5 MG tablet Take 1 tablet by mouth daily.     metoprolol  tartrate (LOPRESSOR ) 25 MG tablet Take 1 tablet (25 mg total) by mouth 2 (two) times daily. 60 tablet 3   Multiple Vitamins-Minerals (CENTRUM SILVER  50+MEN) TABS Take 1 tablet by mouth daily.     spironolactone  (ALDACTONE ) 25 MG tablet Take 1 tablet (25 mg total) by mouth daily. 90 tablet 2   traMADol  (ULTRAM ) 50 MG tablet Take 1-2 tablets (50-100 mg total) by mouth every 6 (six) hours as needed for moderate pain (pain score 4-6). (Patient not taking: Reported on 03/04/2023) 30 tablet 0   No current facility-administered medications for this visit.    HISTORY OF  PRESENT ILLNESS:   Oncology History  Cancer of transverse colon (HCC)  09/03/2022 Procedure   Colonoscopy Findings: -Large, fungating mass 80 cm proximal to anus and descending colon.  Partially circumferential, involving two thirds of the colon lumen. -Several small polyps which were removed. -Medium sized lipoma in sigmoid colon -Internal hemorrhoids   10/14/2022 Imaging   CT abdomen and pelvis with contrast  IMPRESSION: 1. Apparent central filling defect within right lower lobe  posterior segmental pulmonary artery, which may represent a pulmonary embolism. Recommend correlation with PE protocol CTA chest. 2. Mildly featureless appearance of the splenic flexure with suspected mural thickening may correspond to known descending colon mass. 3. No evidence of metastatic disease in the abdomen or pelvis. 4. Cholelithiasis. 5. Enlargement of the prostate with median lobe hypertrophy. 6.  Aortic Atherosclerosis    10/15/2022 Imaging   CT chest angio with contrast  IMPRESSION: 1. Study is positive for large burden of occlusive and nonocclusive pulmonary embolism in the lungs bilaterally, probable clot in the right ventricle and CT evidence of right heart strain (RV/LV Ratio = 1.93) consistent with at least submassive (intermediate risk) PE. The presence of right heart strain has been associated with an increased risk of morbidity and mortality. Please refer to the Code PE Focused order set in EPIC. 2. Small pulmonary nodules measuring 6 mm or less in size. Non-contrast chest CT at 6 months is recommended. If the nodules are stable at time of repeat CT, then future CT at 18-24 months (from today's scan) is considered optional for low-risk patients, but is recommended for high-risk patients. This recommendation follows the consensus statement: Guidelines for Management of Incidental Pulmonary Nodules Detected on CT Images: From the Fleischner Society 2017; Radiology 2017; 284:228-243. 3. Aortic atherosclerosis, in addition to left main and three-vessel coronary artery disease. Please note that although the presence of coronary artery calcium  documents the presence of coronary artery disease, the severity of this disease and any potential stenosis cannot be assessed on this non-gated CT examination. Assessment for potential risk factor modification, dietary therapy or pharmacologic therapy may be warranted, if clinically indicated. 4. Aneurysmal dilatation of the ascending thoracic  aorta (4.9 cm in diameter). Recommend semi-annual imaging followup by CTA or MRA and referral to cardiothoracic surgery if not already obtained. This recommendation follows 2010 ACCF/AHA/AATS/ACR/ASA/SCA/SCAI/SIR/STS/SVM Guidelines for the Diagnosis and Management of Patients With Thoracic Aortic Disease. Circulation. 2010; 121: Z733-z630. Aortic aneurysm NOS    01/10/2023 Initial Diagnosis   Cancer of transverse colon (HCC)   01/10/2023 Pathology Results   Colonic adenocarcinoma, 5.8 cm Carcinoma focally involves visceral peritoneum (pT4a) Negative for lymphovascular involvement Nineteen lymph nodes negative for metastatic carcinoma (0/19) (pN0) Two separate tubular adenomas All margins negative for carcinoma See oncology table  ONCOLOGY TABLE:   COLON AND RECTUM, CARCINOMA:  Resection Procedure: Partial colectomy Tumor Site: Splenic flexure Tumor Size: 5.8 x 3.5 x 1.3 cm Macroscopic Tumor Perforation: Not identified Macroscopic Evaluation of Mesorectum (required for rectal cancer): Not applicable Histologic Type: Colonic adenocarcinoma Histologic Grade: Moderately differentiated, G2 Multiple Primary Sites: Not applicable Tumor Extension: Into pericolonic connective tissue and focally involves visceral peritoneum Lymphovascular Invasion: Not identified Perineural Invasion: Not identified Treatment Effect: No known presurgical therapy Margins:      Margin Status for Invasive Carcinoma: All margins negative for invasive carcinoma Regional Lymph Nodes:      Number of Lymph Nodes with Tumor: 0      Number of Lymph Nodes Examined: 19 Tumor Deposits: Not identified Distant  Metastasis:      Distant Site(s) Involved: Not applicable Pathologic Stage Classification (pTNM, AJCC 8th Edition): pT4a, pN0 Ancillary Studies: MMR and MSI studies are pending Representative Tumor Block: A3-A7 (v4.2.0.1)    01/10/2023 Surgery   Robotic assisted partial colectomy on 01/10/2023.  Cytology  confirmed moderately differentiated, pT4a pN0.    01/10/2023 Cancer Staging   Staging form: Colon and Rectum, AJCC 8th Edition - Pathologic stage from 01/10/2023: Stage IIB (pT4a, pN0, cM0) - Signed by Lanny Callander, MD on 05/21/2023 Total positive nodes: 0 Residual tumor (R): R0       REVIEW OF SYSTEMS:   Constitutional: Denies fevers, chills or abnormal weight loss Eyes: Denies blurriness of vision Ears, nose, mouth, throat, and face: Denies mucositis or sore throat Respiratory: Denies cough, dyspnea or wheezes Cardiovascular: Denies palpitation, chest discomfort or lower extremity swelling Gastrointestinal:  Denies nausea, heartburn or change in bowel habits Skin: Denies abnormal skin rashes Lymphatics: Denies new lymphadenopathy or easy bruising Neurological:Denies numbness, tingling or new weaknesses Behavioral/Psych: Mood is stable, no new changes  All other systems were reviewed with the patient and are negative.   VITALS:  There were no vitals taken for this visit.  Wt Readings from Last 3 Encounters:  07/15/23 179 lb 14.3 oz (81.6 kg)  05/21/23 179 lb 14.4 oz (81.6 kg)  03/04/23 183 lb 9.6 oz (83.3 kg)    There is no height or weight on file to calculate BMI.  Performance status (ECOG): {CHL ONC [id number]  PHYSICAL EXAM:   GENERAL:alert, no distress and comfortable SKIN: skin color, texture, turgor are normal, no rashes or significant lesions EYES: normal, Conjunctiva are pink and non-injected, sclera clear OROPHARYNX:no exudate, no erythema and lips, buccal mucosa, and tongue normal  NECK: supple, thyroid normal size, non-tender, without nodularity LYMPH:  no palpable lymphadenopathy in the cervical, axillary or inguinal LUNGS: clear to auscultation and percussion with normal breathing effort HEART: regular rate & rhythm and no murmurs and no lower extremity edema ABDOMEN:abdomen soft, non-tender and normal bowel sounds Musculoskeletal:no cyanosis of digits  and no clubbing  NEURO: alert & oriented x 3 with fluent speech, no focal motor/sensory deficits  LABORATORY DATA:  I have reviewed the data as listed    Component Value Date/Time   NA 137 07/15/2023 1202   NA 140 04/24/2022 1419   K 3.9 07/15/2023 1202   CL 102 07/15/2023 1202   CO2 25 07/15/2023 1202   GLUCOSE 90 07/15/2023 1202   BUN 20 07/15/2023 1202   BUN 13 04/24/2022 1419   CREATININE 1.32 (H) 07/15/2023 1202   CREATININE 1.53 (H) 05/21/2023 1232   CREATININE 1.16 06/23/2015 1159   CALCIUM  9.7 07/15/2023 1202   PROT 7.3 05/21/2023 1232   ALBUMIN  4.0 05/21/2023 1232   AST 17 05/21/2023 1232   ALT 13 05/21/2023 1232   ALKPHOS 85 05/21/2023 1232   BILITOT 0.4 05/21/2023 1232   GFRNONAA 54 (L) 07/15/2023 1202   GFRNONAA 45 (L) 05/21/2023 1232   GFRAA >60 02/26/2017 1830    No results found for: SPEP, UPEP  Lab Results  Component Value Date   WBC 3.8 (L) 07/15/2023   NEUTROABS 1.8 07/15/2023   HGB 12.2 (L) 07/15/2023   HCT 38.3 (L) 07/15/2023   MCV 95.3 07/15/2023   PLT 112 (L) 07/15/2023      Chemistry      Component Value Date/Time   NA 137 07/15/2023 1202   NA 140 04/24/2022 1419   K 3.9 07/15/2023 1202  CL 102 07/15/2023 1202   CO2 25 07/15/2023 1202   BUN 20 07/15/2023 1202   BUN 13 04/24/2022 1419   CREATININE 1.32 (H) 07/15/2023 1202   CREATININE 1.53 (H) 05/21/2023 1232   CREATININE 1.16 06/23/2015 1159      Component Value Date/Time   CALCIUM  9.7 07/15/2023 1202   ALKPHOS 85 05/21/2023 1232   AST 17 05/21/2023 1232   ALT 13 05/21/2023 1232   BILITOT 0.4 05/21/2023 1232       RADIOGRAPHIC STUDIES: I have personally reviewed the radiological images as listed and agreed with the findings in the report. No results found.

## 2023-08-20 NOTE — Assessment & Plan Note (Deleted)
 01/10/2023 - robotic assisted partial colectomy. -pathology confirmed moderately differentiated, pT4a pN0.  This is a stage IIb colon cancer. MMR normal  -Adjuvant Xeloda  for 3-6 months was recommended. He took 2 cycles, did not tolerate well and stopped on his own.  -Surveillance CT scan from May 28, 2023 was negative for disease recurrence. -due for surveillance scan

## 2023-08-21 ENCOUNTER — Telehealth: Payer: Self-pay

## 2023-08-21 ENCOUNTER — Inpatient Hospital Stay: Admitting: Nurse Practitioner

## 2023-08-21 ENCOUNTER — Other Ambulatory Visit: Payer: Self-pay

## 2023-08-21 ENCOUNTER — Inpatient Hospital Stay: Attending: Nurse Practitioner

## 2023-08-21 DIAGNOSIS — C184 Malignant neoplasm of transverse colon: Secondary | ICD-10-CM

## 2023-08-21 NOTE — Telephone Encounter (Signed)
 Called patient due to not showing up for his 1230 lab app and his 1300 app with Powell Lessen NP. Patient stated he forgot he had app. Sent a message to scheduling to contact him to have them rescheduled.

## 2023-09-02 ENCOUNTER — Telehealth: Payer: Self-pay | Admitting: Nurse Practitioner

## 2023-09-02 NOTE — Telephone Encounter (Signed)
 Scheduled appointments per staff message. Talked with the patient and he is aware of the made appointments.

## 2023-09-10 NOTE — Assessment & Plan Note (Addendum)
 01/10/2023 - robotic assisted partial colectomy. -pathology confirmed moderately differentiated, pT4a pN0.  This is a stage IIb colon cancer. MMR normal  -Adjuvant Xeloda  for 3-6 months was recommended. He took 2 cycles, did not tolerate well and stopped on his own.  -Surveillance CT scan from May 28, 2023 was negative for disease recurrence. - 09/11/2023 -surveillance CT CAP ordered as part of today's  visit. -- Continue cancer surveillance. -- Labs and follow-up in 3 months, sooner if needed.

## 2023-09-10 NOTE — Progress Notes (Signed)
 Patient Care Team: Leonel Cole, MD as PCP - General (Family Medicine) Nahser, Aleene PARAS, MD as PCP - Cardiology (Cardiology) Lanny Callander, MD as Consulting Physician (Hematology and Oncology)  Clinic Day:  09/11/2023  Referring physician: Leonel Cole, MD  ASSESSMENT & PLAN:   Assessment & Plan: Cancer of transverse colon (HCC) 01/10/2023 - robotic assisted partial colectomy. -pathology confirmed moderately differentiated, pT4a pN0.  This is a stage IIb colon cancer. MMR normal  -Adjuvant Xeloda  for 3-6 months was recommended. He took 2 cycles, did not tolerate well and stopped on his own.  -Surveillance CT scan from May 28, 2023 was negative for disease recurrence. - 09/11/2023 -surveillance CT CAP ordered as part of today's  visit. -- Continue cancer surveillance. -- Labs and follow-up in 3 months, sooner if needed.   Plan Labs reviewed. -Mild and stable anemia with Hgb 12.6 and HCT 37.4.  Mild and improved thrombocytopenia with platelets at 122. -CMP showing mild CKD with BUN 17, creatinine 1.48, and GFR 47. -CEA normal 2.87. Surveillance CT CAP ordered as part of today's visit.  Will call patient with results when they are available. Plan for labs and follow-up in 3 months, sooner if needed.  The patient understands the plans discussed today and is in agreement with them.  He knows to contact our office if he develops concerns prior to his next appointment.  I provided 25 minutes of face-to-face time during this encounter and > 50% was spent counseling as documented under my assessment and plan.    Powell FORBES Lessen, NP  [facility] - A DEPT OF [facility] [address] [place] [address] Dept: [phone] Dept Fax: [phone]   Orders Placed This Encounter  Procedures   CT CHEST ABDOMEN PELVIS WO CONTRAST    Standing Status:   Future    Expected Date:   09/25/2023    Expiration Date:   09/10/2024     Preferred imaging location?:   [facility]    If indicated for the ordered procedure, I authorize the administration of oral contrast media per Radiology protocol:   Yes    Does the patient have a contrast media/X-ray dye allergy?:   No      CHIEF COMPLAINT:  CC: Cancer of transverse colon  Current Treatment: Surveillance  INTERVAL HISTORY:  Rector is here today for repeat clinical assessment.  He was last seen by Dr. Lanny on 06/05/2023.  Surveillance CT from 05/28/2023 was negative for metastatic disease or recurrence.  He is due for surveillance CT CAP.  States he is well.  He is very active and exercises frequently.  He denies chest pain, chest pressure, or shortness of breath. He denies headaches or visual disturbances. He denies abdominal pain, nausea, vomiting, or changes in bowel or bladder habits.  He denies fevers or chills. He denies pain. His appetite is good. His weight has decreased 6 pounds over last 4 months.  I have reviewed the past medical history, past surgical history, social history and family history with the patient and they are unchanged from previous note.  ALLERGIES:  has no known allergies.  MEDICATIONS:  Current Outpatient Medications  Medication Sig Dispense Refill   atorvastatin  (LIPITOR) 20 MG tablet Take 1 tablet (20 mg total) by mouth daily. Please schedule appointment for future refills. 3rd and final attempt. Thank you (Patient taking differently: Take 20 mg by mouth at bedtime.) 15 tablet 0  capecitabine  (XELODA ) 500 MG tablet Take 3 tablets (1500 mg total) by mouth 2 (two) times daily within 30 minutes after meals. Take for 14 days, then off 7 days. Repeat every 21 days. Start on 02/10/2023 84 tablet 0   diclofenac  Sodium (VOLTAREN ) 1 % GEL Research Patient: Apply 0.5 grams (1 fingertip) to each hand and each foot twice daily for up to 12 weeks (Patient not taking: Reported on 03/04/2023) 400 g 0   ELIQUIS  5 MG TABS tablet TAKE 1 TABLET BY MOUTH  TWICE A DAY 60 tablet 5   Iron Combinations (IRON COMPLEX PO) Take 65 mg by mouth daily.     lisinopril -hydrochlorothiazide  (ZESTORETIC ) 10-12.5 MG tablet Take 1 tablet by mouth daily.     metoprolol  tartrate (LOPRESSOR ) 25 MG tablet Take 1 tablet (25 mg total) by mouth 2 (two) times daily. 60 tablet 3   Multiple Vitamins-Minerals (CENTRUM SILVER  50+MEN) TABS Take 1 tablet by mouth daily.     spironolactone  (ALDACTONE ) 25 MG tablet Take 1 tablet (25 mg total) by mouth daily. 90 tablet 2   traMADol  (ULTRAM ) 50 MG tablet Take 1-2 tablets (50-100 mg total) by mouth every 6 (six) hours as needed for moderate pain (pain score 4-6). (Patient not taking: Reported on 03/04/2023) 30 tablet 0   No current facility-administered medications for this visit.    HISTORY OF PRESENT ILLNESS:   Oncology History  Cancer of transverse colon (HCC)  09/03/2022 Procedure   Colonoscopy Findings: -Large, fungating mass 80 cm proximal to anus and descending colon.  Partially circumferential, involving two thirds of the colon lumen. -Several small polyps which were removed. -Medium sized lipoma in sigmoid colon -Internal hemorrhoids   10/14/2022 Imaging   CT abdomen and pelvis with contrast  IMPRESSION: 1. Apparent central filling defect within right lower lobe posterior segmental pulmonary artery, which may represent a pulmonary embolism. Recommend correlation with PE protocol CTA chest. 2. Mildly featureless appearance of the splenic flexure with suspected mural thickening may correspond to known descending colon mass. 3. No evidence of metastatic disease in the abdomen or pelvis. 4. Cholelithiasis. 5. Enlargement of the prostate with median lobe hypertrophy. 6.  Aortic Atherosclerosis    10/15/2022 Imaging   CT chest angio with contrast  IMPRESSION: 1. Study is positive for large burden of occlusive and nonocclusive pulmonary embolism in the lungs bilaterally, probable clot in the right ventricle and CT  evidence of right heart strain (RV/LV Ratio = 1.93) consistent with at least submassive (intermediate risk) PE. The presence of right heart strain has been associated with an increased risk of morbidity and mortality. Please refer to the Code PE Focused order set in EPIC. 2. Small pulmonary nodules measuring 6 mm or less in size. Non-contrast chest CT at 6 months is recommended. If the nodules are stable at time of repeat CT, then future CT at 18-24 months (from today's scan) is considered optional for low-risk patients, but is recommended for high-risk patients. This recommendation follows the consensus statement: Guidelines for Management of Incidental Pulmonary Nodules Detected on CT Images: From the Fleischner Society 2017; Radiology 2017; 284:228-243. 3. Aortic atherosclerosis, in addition to left main and three-vessel coronary artery disease. Please note that although the presence of coronary artery calcium  documents the presence of coronary artery disease, the severity of this disease and any potential stenosis cannot be assessed on this non-gated CT examination. Assessment for potential risk factor modification, dietary therapy or pharmacologic therapy may be warranted, if clinically indicated. 4. Aneurysmal dilatation  of the ascending thoracic aorta (4.9 cm in diameter). Recommend semi-annual imaging followup by CTA or MRA and referral to cardiothoracic surgery if not already obtained. This recommendation follows 2010 ACCF/AHA/AATS/ACR/ASA/SCA/SCAI/SIR/STS/SVM Guidelines for the Diagnosis and Management of Patients With Thoracic Aortic Disease. Circulation. 2010; 121: Z733-z630. Aortic aneurysm NOS    01/10/2023 Initial Diagnosis   Cancer of transverse colon (HCC)   01/10/2023 Pathology Results   Colonic adenocarcinoma, 5.8 cm Carcinoma focally involves visceral peritoneum (pT4a) Negative for lymphovascular involvement Nineteen lymph nodes negative for metastatic carcinoma (0/19) (pN0) Two  separate tubular adenomas All margins negative for carcinoma See oncology table  ONCOLOGY TABLE:   COLON AND RECTUM, CARCINOMA:  Resection Procedure: Partial colectomy Tumor Site: Splenic flexure Tumor Size: 5.8 x 3.5 x 1.3 cm Macroscopic Tumor Perforation: Not identified Macroscopic Evaluation of Mesorectum (required for rectal cancer): Not applicable Histologic Type: Colonic adenocarcinoma Histologic Grade: Moderately differentiated, G2 Multiple Primary Sites: Not applicable Tumor Extension: Into pericolonic connective tissue and focally involves visceral peritoneum Lymphovascular Invasion: Not identified Perineural Invasion: Not identified Treatment Effect: No known presurgical therapy Margins:      Margin Status for Invasive Carcinoma: All margins negative for invasive carcinoma Regional Lymph Nodes:      Number of Lymph Nodes with Tumor: 0      Number of Lymph Nodes Examined: 19 Tumor Deposits: Not identified Distant Metastasis:      Distant Site(s) Involved: Not applicable Pathologic Stage Classification (pTNM, AJCC 8th Edition): pT4a, pN0 Ancillary Studies: MMR and MSI studies are pending Representative Tumor Block: A3-A7 (v4.2.0.1)    01/10/2023 Surgery   Robotic assisted partial colectomy on 01/10/2023.  Cytology confirmed moderately differentiated, pT4a pN0.    01/10/2023 Cancer Staging   Staging form: Colon and Rectum, AJCC 8th Edition - Pathologic stage from 01/10/2023: Stage IIB (pT4a, pN0, cM0) - Signed by Lanny Callander, MD on 05/21/2023 Total positive nodes: 0 Residual tumor (R): R0       REVIEW OF SYSTEMS:   Constitutional: Denies fevers, chills or abnormal weight loss Eyes: Denies blurriness of vision Ears, nose, mouth, throat, and face: Denies mucositis or sore throat Respiratory: Denies cough, dyspnea or wheezes Cardiovascular: Denies palpitation, chest discomfort or lower extremity swelling Gastrointestinal:  Denies nausea, heartburn or change in  bowel habits Skin: Denies abnormal skin rashes Lymphatics: Denies new lymphadenopathy or easy bruising Neurological:Denies numbness, tingling or new weaknesses Behavioral/Psych: Mood is stable, no new changes  All other systems were reviewed with the patient and are negative.   VITALS:   Today's Vitals   09/11/23 1305  BP: 110/62  Pulse: 60  Resp: 17  Temp: 97.9 F (36.6 C)  SpO2: 99%  Weight: 173 lb 1.6 oz (78.5 kg)   Body mass index is 25.56 kg/m.   Wt Readings from Last 3 Encounters:  09/11/23 173 lb 1.6 oz (78.5 kg)  07/15/23 179 lb 14.3 oz (81.6 kg)  05/21/23 179 lb 14.4 oz (81.6 kg)    Body mass index is 25.56 kg/m.  Performance status (ECOG): 0 - Asymptomatic  PHYSICAL EXAM:   GENERAL:alert, no distress and comfortable SKIN: skin color, texture, turgor are normal, no rashes or significant lesions EYES: normal, Conjunctiva are pink and non-injected, sclera clear OROPHARYNX:no exudate, no erythema and lips, buccal mucosa, and tongue normal  NECK: supple, thyroid normal size, non-tender, without nodularity LYMPH:  no palpable lymphadenopathy in the cervical, axillary or inguinal LUNGS: clear to auscultation and percussion with normal breathing effort HEART: regular rate & rhythm and no murmurs  and no lower extremity edema ABDOMEN:abdomen soft, non-tender and normal bowel sounds Musculoskeletal:no cyanosis of digits and no clubbing  NEURO: alert & oriented x 3 with fluent speech, no focal motor/sensory deficits  LABORATORY DATA:  I have reviewed the data as listed    Component Value Date/Time   NA 141 09/11/2023 1254   NA 140 04/24/2022 1419   K 3.8 09/11/2023 1254   CL 109 09/11/2023 1254   CO2 27 09/11/2023 1254   GLUCOSE 82 09/11/2023 1254   BUN 17 09/11/2023 1254   BUN 13 04/24/2022 1419   CREATININE 1.48 (H) 09/11/2023 1254   CREATININE 1.16 06/23/2015 1159   CALCIUM  9.3 09/11/2023 1254   PROT 7.2 09/11/2023 1254   ALBUMIN  4.0 09/11/2023 1254    AST 21 09/11/2023 1254   ALT 17 09/11/2023 1254   ALKPHOS 86 09/11/2023 1254   BILITOT 0.4 09/11/2023 1254   GFRNONAA 47 (L) 09/11/2023 1254   GFRAA >60 02/26/2017 1830    Lab Results  Component Value Date   WBC 3.7 (L) 09/11/2023   NEUTROABS 1.9 09/11/2023   HGB 12.6 (L) 09/11/2023   HCT 37.4 (L) 09/11/2023   MCV 86.4 09/11/2023   PLT 122 (L) 09/11/2023

## 2023-09-11 ENCOUNTER — Inpatient Hospital Stay (HOSPITAL_BASED_OUTPATIENT_CLINIC_OR_DEPARTMENT_OTHER): Admitting: Nurse Practitioner

## 2023-09-11 ENCOUNTER — Inpatient Hospital Stay: Attending: Nurse Practitioner

## 2023-09-11 VITALS — BP 110/62 | HR 60 | Temp 97.9°F | Resp 17 | Wt 173.1 lb

## 2023-09-11 DIAGNOSIS — N189 Chronic kidney disease, unspecified: Secondary | ICD-10-CM | POA: Diagnosis not present

## 2023-09-11 DIAGNOSIS — D696 Thrombocytopenia, unspecified: Secondary | ICD-10-CM | POA: Insufficient documentation

## 2023-09-11 DIAGNOSIS — C184 Malignant neoplasm of transverse colon: Secondary | ICD-10-CM | POA: Diagnosis not present

## 2023-09-11 DIAGNOSIS — Z79899 Other long term (current) drug therapy: Secondary | ICD-10-CM | POA: Diagnosis not present

## 2023-09-11 DIAGNOSIS — D649 Anemia, unspecified: Secondary | ICD-10-CM | POA: Diagnosis not present

## 2023-09-11 LAB — CMP (CANCER CENTER ONLY)
ALT: 17 U/L (ref 0–44)
AST: 21 U/L (ref 15–41)
Albumin: 4 g/dL (ref 3.5–5.0)
Alkaline Phosphatase: 86 U/L (ref 38–126)
Anion gap: 5 (ref 5–15)
BUN: 17 mg/dL (ref 8–23)
CO2: 27 mmol/L (ref 22–32)
Calcium: 9.3 mg/dL (ref 8.9–10.3)
Chloride: 109 mmol/L (ref 98–111)
Creatinine: 1.48 mg/dL — ABNORMAL HIGH (ref 0.61–1.24)
GFR, Estimated: 47 mL/min — ABNORMAL LOW
Glucose, Bld: 82 mg/dL (ref 70–99)
Potassium: 3.8 mmol/L (ref 3.5–5.1)
Sodium: 141 mmol/L (ref 135–145)
Total Bilirubin: 0.4 mg/dL (ref 0.0–1.2)
Total Protein: 7.2 g/dL (ref 6.5–8.1)

## 2023-09-11 LAB — CBC WITH DIFFERENTIAL (CANCER CENTER ONLY)
Abs Immature Granulocytes: 0.01 K/uL (ref 0.00–0.07)
Basophils Absolute: 0 K/uL (ref 0.0–0.1)
Basophils Relative: 1 %
Eosinophils Absolute: 0.1 K/uL (ref 0.0–0.5)
Eosinophils Relative: 4 %
HCT: 37.4 % — ABNORMAL LOW (ref 39.0–52.0)
Hemoglobin: 12.6 g/dL — ABNORMAL LOW (ref 13.0–17.0)
Immature Granulocytes: 0 %
Lymphocytes Relative: 32 %
Lymphs Abs: 1.2 K/uL (ref 0.7–4.0)
MCH: 29.1 pg (ref 26.0–34.0)
MCHC: 33.7 g/dL (ref 30.0–36.0)
MCV: 86.4 fL (ref 80.0–100.0)
Monocytes Absolute: 0.4 K/uL (ref 0.1–1.0)
Monocytes Relative: 11 %
Neutro Abs: 1.9 K/uL (ref 1.7–7.7)
Neutrophils Relative %: 52 %
Platelet Count: 122 K/uL — ABNORMAL LOW (ref 150–400)
RBC: 4.33 MIL/uL (ref 4.22–5.81)
RDW: 13 % (ref 11.5–15.5)
WBC Count: 3.7 K/uL — ABNORMAL LOW (ref 4.0–10.5)
nRBC: 0 % (ref 0.0–0.2)

## 2023-09-11 LAB — CEA (ACCESS): CEA (CHCC): 2.87 ng/mL (ref 0.00–5.00)

## 2023-09-14 ENCOUNTER — Encounter: Payer: Self-pay | Admitting: Nurse Practitioner

## 2023-09-15 ENCOUNTER — Telehealth: Payer: Self-pay | Admitting: Nurse Practitioner

## 2023-09-15 NOTE — Telephone Encounter (Signed)
 Scheduled appointments per 7/17 los. Talked with the patient and he is aware of the made appointments.

## 2023-09-25 ENCOUNTER — Ambulatory Visit (HOSPITAL_COMMUNITY): Attending: Nurse Practitioner

## 2023-10-01 DIAGNOSIS — R339 Retention of urine, unspecified: Secondary | ICD-10-CM | POA: Diagnosis not present

## 2023-10-30 DIAGNOSIS — I7121 Aneurysm of the ascending aorta, without rupture: Secondary | ICD-10-CM | POA: Diagnosis not present

## 2023-10-30 DIAGNOSIS — D72819 Decreased white blood cell count, unspecified: Secondary | ICD-10-CM | POA: Diagnosis not present

## 2023-10-30 DIAGNOSIS — J069 Acute upper respiratory infection, unspecified: Secondary | ICD-10-CM | POA: Diagnosis not present

## 2023-10-30 DIAGNOSIS — C801 Malignant (primary) neoplasm, unspecified: Secondary | ICD-10-CM | POA: Diagnosis not present

## 2023-10-30 DIAGNOSIS — I2510 Atherosclerotic heart disease of native coronary artery without angina pectoris: Secondary | ICD-10-CM | POA: Diagnosis not present

## 2023-10-30 DIAGNOSIS — N529 Male erectile dysfunction, unspecified: Secondary | ICD-10-CM | POA: Diagnosis not present

## 2023-10-30 DIAGNOSIS — I429 Cardiomyopathy, unspecified: Secondary | ICD-10-CM | POA: Diagnosis not present

## 2023-10-30 DIAGNOSIS — I2699 Other pulmonary embolism without acute cor pulmonale: Secondary | ICD-10-CM | POA: Diagnosis not present

## 2023-10-30 DIAGNOSIS — I10 Essential (primary) hypertension: Secondary | ICD-10-CM | POA: Diagnosis not present

## 2023-10-30 DIAGNOSIS — R911 Solitary pulmonary nodule: Secondary | ICD-10-CM | POA: Diagnosis not present

## 2023-10-30 DIAGNOSIS — I352 Nonrheumatic aortic (valve) stenosis with insufficiency: Secondary | ICD-10-CM | POA: Diagnosis not present

## 2023-10-30 DIAGNOSIS — E7800 Pure hypercholesterolemia, unspecified: Secondary | ICD-10-CM | POA: Diagnosis not present

## 2023-11-06 DIAGNOSIS — Z008 Encounter for other general examination: Secondary | ICD-10-CM | POA: Diagnosis not present

## 2023-11-26 ENCOUNTER — Encounter: Payer: Self-pay | Admitting: Surgery

## 2023-11-26 ENCOUNTER — Ambulatory Visit: Attending: Surgery | Admitting: Surgery

## 2023-11-26 VITALS — BP 101/68 | HR 79 | Resp 18 | Wt 169.0 lb

## 2023-11-26 DIAGNOSIS — I712 Thoracic aortic aneurysm, without rupture, unspecified: Secondary | ICD-10-CM | POA: Diagnosis not present

## 2023-11-26 NOTE — Progress Notes (Signed)
 [address], [place] [address]             [phone]    HPI:  This 83 year old patient returns for follow-up of a 4.9 cm ascending aortic aneurysm beyond the aortotomy site status post aortic valve replacement with a 23 mm  Magna-ease pericardial valve by me on 04/11/2014.  Since I last saw him October 2024 he underwent colectomy for colon cancer.  He says that he is feeling well and denies any chest pain or shortness of breath.  Current Outpatient Medications  Medication Sig Dispense Refill   atorvastatin  (LIPITOR) 20 MG tablet Take 1 tablet (20 mg total) by mouth daily. Please schedule appointment for future refills. 3rd and final attempt. Thank you (Patient taking differently: Take 20 mg by mouth at bedtime.) 15 tablet 0   capecitabine  (XELODA ) 500 MG tablet Take 3 tablets (1500 mg total) by mouth 2 (two) times daily within 30 minutes after meals. Take for 14 days, then off 7 days. Repeat every 21 days. Start on 02/10/2023 84 tablet 0   ELIQUIS  5 MG TABS tablet TAKE 1 TABLET BY MOUTH TWICE A DAY 60 tablet 5   Iron Combinations (IRON COMPLEX PO) Take 65 mg by mouth daily.     lisinopril -hydrochlorothiazide  (ZESTORETIC ) 10-12.5 MG tablet Take 1 tablet by mouth daily.     metoprolol  tartrate (LOPRESSOR ) 25 MG tablet Take 1 tablet (25 mg total) by mouth 2 (two) times daily. 60 tablet 3   Multiple Vitamins-Minerals (CENTRUM SILVER  50+MEN) TABS Take 1 tablet by mouth daily.     spironolactone  (ALDACTONE ) 25 MG tablet Take 1 tablet (25 mg total) by mouth daily. 90 tablet 2   diclofenac  Sodium (VOLTAREN ) 1 % GEL Research Patient: Apply 0.5 grams (1 fingertip) to each hand and each foot twice daily for up to 12 weeks (Patient not taking: Reported on 11/26/2023) 400 g 0   traMADol  (ULTRAM ) 50 MG tablet Take 1-2 tablets (50-100 mg total) by mouth every 6 (six) hours as needed for moderate pain (pain score 4-6). (Patient not taking: Reported on 11/26/2023) 30 tablet 0   No  current facility-administered medications for this visit.     Physical Exam: BP 101/68   Pulse 79   Resp 18   Wt 169 lb (76.7 kg)   SpO2 99% Comment: RA  BMI 24.96 kg/m  He looks well. Cardiac exam shows a regular rate and rhythm with normal heart sounds.  There is no murmur. Lungs are clear. There is no peripheral edema.   Diagnostic Tests:  Narrative & Impression  CLINICAL DATA:  Follow-up colon carcinoma. Thoracic aortic aneurysm. * Tracking Code: BO *   EXAM: CT CHEST, ABDOMEN AND PELVIS WITHOUT CONTRAST   TECHNIQUE: Multidetector CT imaging of the chest, abdomen and pelvis was performed following the standard protocol without IV contrast.   RADIATION DOSE REDUCTION: This exam was performed according to the departmental dose-optimization program which includes automated exposure control, adjustment of the mA and/or kV according to patient size and/or use of iterative reconstruction technique.   COMPARISON:  10/15/2022 and 10/14/2022   FINDINGS: CT CHEST FINDINGS   Cardiovascular: No acute findings. Prior aortic valve replacement and aortic root graft noted. 4.9 cm aneurysm of the proximal ascending thoracic aorta remains stable. No No evidence of mediastinal hematoma or pericardial effusion.   Mediastinum/Lymph Nodes: No masses or pathologically enlarged lymph nodes identified on this unenhanced exam.   Lungs/Pleura: No evidence of  infiltrate, mass, or pleural effusion. Stable mild bilateral pleural-parenchymal scarring and mild bilateral lower lobe bronchiectasis.   Musculoskeletal:  No suspicious bone lesions identified.   CT ABDOMEN AND PELVIS FINDINGS   Hepatobiliary: No masses visualized on this unenhanced exam. A few tiny calcified gallstones again seen, without signs of cholecystitis or biliary ductal dilatation.   Pancreas: No mass or inflammatory changes identified on this unenhanced exam.   Spleen:  Within normal limits in size.    Adrenals/Urinary Tract: Stable 1.9 cm right adrenal mass, consistent with benign adenoma. Punctate calculus noted in midpole of right kidney. No evidence of ureteral calculi or hydronephrosis. Unremarkable appearance of bladder.   Stomach/Bowel: Stable postop changes from prior left colectomy. No soft tissue masses identified. No evidence of obstruction, inflammatory process, or abnormal fluid collections. Normal appendix visualized.   Vascular/Lymphatic: No pathologically enlarged lymph nodes identified. IVC filter in expected position. No abdominal aortic aneurysm.   Reproductive:  Moderately enlarged prostate.   Other:  None.   Musculoskeletal:  No suspicious bone lesions identified.   IMPRESSION: No acute findings.   No evidence of recurrent or metastatic disease.   Stable 4.9 cm aneurysm of ascending thoracic aorta. Recommend semi-annual imaging followup by CTA or MRA and referral to cardiothoracic surgery if not already obtained. This recommendation follows 2010 ACCF/AHA/AATS/ACR/ASA/SCA/SCAI/SIR/STS/SVM Guidelines for the Diagnosis and Management of Patients With Thoracic Aortic Disease. Circulation. 2010; 121: Z733-z630. Aortic aneurysm NOS (ICD10-I71.9)   Cholelithiasis. No radiographic evidence of cholecystitis.   Punctate right renal calculus. No evidence of ureteral calculi or hydronephrosis.   Moderately enlarged prostate.     Electronically Signed   By: Norleen DELENA Kil M.D.   On: 05/28/2023 19:35    Impression:  This 83 year old gentleman has a stable 4.9 cm fusiform ascending aortic aneurysm beyond the previous aortotomy site status post bioprosthetic AVR in 2016. His aneurysm is still well below the surgical threshold of 5.5 cm.  I reviewed the CT images with him and answered all of his questions.  I stressed the importance of continued good blood pressure control in preventing further enlargement and acute aortic dissection.  I advised him against doing  any heavy lifting that may require a Valsalva maneuver and could suddenly raise his blood pressure to high levels.   Plan:  I will see him back in 1 year with a CT scan of the chest without contrast for aortic surveillance due to his renal dysfunction.   Dorise MARLA Fellers, MD [facility] [phone]

## 2023-12-11 ENCOUNTER — Other Ambulatory Visit: Payer: Self-pay | Admitting: Nurse Practitioner

## 2023-12-11 DIAGNOSIS — C184 Malignant neoplasm of transverse colon: Secondary | ICD-10-CM

## 2023-12-11 NOTE — Assessment & Plan Note (Deleted)
 01/10/2023 - robotic assisted partial colectomy. -pathology confirmed moderately differentiated, pT4a pN0.  This is a stage IIb colon cancer. MMR normal  -Adjuvant Xeloda  for 3-6 months was recommended. He took 2 cycles, did not tolerate well and stopped on his own.  -Surveillance CT scan from May 28, 2023 was negative for disease recurrence. - 09/11/2023 -surveillance CT CAP ordered as part of today's  visit. -- Continue cancer surveillance. -- Labs and follow-up in 3 months, sooner if needed.

## 2023-12-11 NOTE — Progress Notes (Deleted)
 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 -pathology confirmed moderately differentiated, pT4a pN0.  This is a stage IIb colon cancer. MMR normal  -Adjuvant Xeloda  for 3-6 months was recommended. He took 2 cycles, did not tolerate well and stopped on his own.  -Surveillance CT scan from May 28, 2023 was negative for disease recurrence. - 09/11/2023 -surveillance CT CAP ordered as part of today's  visit. -- Continue cancer surveillance. -- Labs and follow-up in 3 months, sooner if needed.    The patient understands the plans discussed today and is in agreement with them.  He knows to contact our office if he develops concerns prior to his next appointment.  I provided *** minutes of face-to-face time during this encounter and > 50% was spent counseling as documented under my assessment and plan.    Powell FORBES Lessen, NP  [facility] - A DEPT OF [facility] [address] [place] [address] Dept: [phone] Dept Fax: [phone]   No orders of the defined types were placed in this encounter.     CHIEF COMPLAINT:  CC: ***  Current Treatment:  ***  INTERVAL HISTORY:  Ark is here today for repeat clinical assessment. He denies fevers or chills. He denies pain. His appetite is good. His weight {Weight change:10426}.  I have reviewed the past medical history, past surgical history, social history and family history with the patient and they are unchanged from previous note.  ALLERGIES:  has no known allergies.  MEDICATIONS:  Current Outpatient Medications   Medication Sig Dispense Refill   atorvastatin  (LIPITOR) 20 MG tablet Take 1 tablet (20 mg total) by mouth daily. Please schedule appointment for future refills. 3rd and final attempt. Thank you (Patient taking differently: Take 20 mg by mouth at bedtime.) 15 tablet 0   capecitabine  (XELODA ) 500 MG tablet Take 3 tablets (1500 mg total) by mouth 2 (two) times daily within 30 minutes after meals. Take for 14 days, then off 7 days. Repeat every 21 days. Start on 02/10/2023 84 tablet 0   diclofenac  Sodium (VOLTAREN ) 1 % GEL Research Patient: Apply 0.5 grams (1 fingertip) to each hand and each foot twice daily for up to 12 weeks (Patient not taking: Reported on 11/26/2023) 400 g 0   ELIQUIS  5 MG TABS tablet TAKE 1 TABLET BY MOUTH TWICE A DAY 60 tablet 5   Iron Combinations (IRON COMPLEX PO) Take 65 mg by mouth daily.     lisinopril -hydrochlorothiazide  (ZESTORETIC ) 10-12.5 MG tablet Take 1 tablet by mouth daily.     metoprolol  tartrate (LOPRESSOR ) 25 MG tablet Take 1 tablet (25 mg total) by mouth 2 (two) times daily. 60 tablet 3   Multiple Vitamins-Minerals (CENTRUM SILVER  50+MEN) TABS Take 1 tablet by mouth daily.     spironolactone  (ALDACTONE ) 25 MG tablet Take 1 tablet (25 mg total) by mouth daily. 90 tablet 2   traMADol  (ULTRAM ) 50 MG tablet Take 1-2 tablets (50-100 mg total) by mouth every 6 (six) hours as needed for moderate pain (pain score 4-6). (Patient not taking: Reported on 11/26/2023) 30 tablet 0   No current facility-administered  medications for this visit.    HISTORY OF PRESENT ILLNESS:   Oncology History  Cancer of transverse colon (HCC)  09/03/2022 Procedure   Colonoscopy Findings: -Large, fungating mass 80 cm proximal to anus and descending colon.  Partially circumferential, involving two thirds of the colon lumen. -Several small polyps which were removed. -Medium sized lipoma in sigmoid colon -Internal hemorrhoids   10/14/2022 Imaging   CT abdomen and pelvis with contrast   IMPRESSION: 1. Apparent central filling defect within right lower lobe posterior segmental pulmonary artery, which may represent a pulmonary embolism. Recommend correlation with PE protocol CTA chest. 2. Mildly featureless appearance of the splenic flexure with suspected mural thickening may correspond to known descending colon mass. 3. No evidence of metastatic disease in the abdomen or pelvis. 4. Cholelithiasis. 5. Enlargement of the prostate with median lobe hypertrophy. 6.  Aortic Atherosclerosis    10/15/2022 Imaging   CT chest angio with contrast  IMPRESSION: 1. Study is positive for large burden of occlusive and nonocclusive pulmonary embolism in the lungs bilaterally, probable clot in the right ventricle and CT evidence of right heart strain (RV/LV Ratio = 1.93) consistent with at least submassive (intermediate risk) PE. The presence of right heart strain has been associated with an increased risk of morbidity and mortality. Please refer to the Code PE Focused order set in EPIC. 2. Small pulmonary nodules measuring 6 mm or less in size. Non-contrast chest CT at 6 months is recommended. If the nodules are stable at time of repeat CT, then future CT at 18-24 months (from today's scan) is considered optional for low-risk patients, but is recommended for high-risk patients. This recommendation follows the consensus statement: Guidelines for Management of Incidental Pulmonary Nodules Detected on CT Images: From the Fleischner Society 2017; Radiology 2017; 284:228-243. 3. Aortic atherosclerosis, in addition to left main and three-vessel coronary artery disease. Please note that although the presence of coronary artery calcium  documents the presence of coronary artery disease, the severity of this disease and any potential stenosis cannot be assessed on this non-gated CT examination. Assessment for potential risk factor modification, dietary therapy or pharmacologic therapy may be warranted, if  clinically indicated. 4. Aneurysmal dilatation of the ascending thoracic aorta (4.9 cm in diameter). Recommend semi-annual imaging followup by CTA or MRA and referral to cardiothoracic surgery if not already obtained. This recommendation follows 2010 ACCF/AHA/AATS/ACR/ASA/SCA/SCAI/SIR/STS/SVM Guidelines for the Diagnosis and Management of Patients With Thoracic Aortic Disease. Circulation. 2010; 121: Z733-z630. Aortic aneurysm NOS    01/10/2023 Initial Diagnosis   Cancer of transverse colon (HCC)   01/10/2023 Pathology Results   Colonic adenocarcinoma, 5.8 cm Carcinoma focally involves visceral peritoneum (pT4a) Negative for lymphovascular involvement Nineteen lymph nodes negative for metastatic carcinoma (0/19) (pN0) Two separate tubular adenomas All margins negative for carcinoma See oncology table  ONCOLOGY TABLE:   COLON AND RECTUM, CARCINOMA:  Resection Procedure: Partial colectomy Tumor Site: Splenic flexure Tumor Size: 5.8 x 3.5 x 1.3 cm Macroscopic Tumor Perforation: Not identified Macroscopic Evaluation of Mesorectum (required for rectal cancer): Not applicable Histologic Type: Colonic adenocarcinoma Histologic Grade: Moderately differentiated, G2 Multiple Primary Sites: Not applicable Tumor Extension: Into pericolonic connective tissue and focally involves visceral peritoneum Lymphovascular Invasion: Not identified Perineural Invasion: Not identified Treatment Effect: No known presurgical therapy Margins:      Margin Status for Invasive Carcinoma: All margins negative for invasive carcinoma Regional Lymph Nodes:      Number of Lymph Nodes with Tumor: 0      Number of  Lymph Nodes Examined: 19 Tumor Deposits: Not identified Distant Metastasis:      Distant Site(s) Involved: Not applicable Pathologic Stage Classification (pTNM, AJCC 8th Edition): pT4a, pN0 Ancillary Studies: MMR and MSI studies are pending Representative Tumor Block: A3-A7 (v4.2.0.1)    01/10/2023  Surgery   Robotic assisted partial colectomy on 01/10/2023.  Cytology confirmed moderately differentiated, pT4a pN0.    01/10/2023 Cancer Staging   Staging form: Colon and Rectum, AJCC 8th Edition - Pathologic stage from 01/10/2023: Stage IIB (pT4a, pN0, cM0) - Signed by Lanny Callander, MD on 05/21/2023 Total positive nodes: 0 Residual tumor (R): R0       REVIEW OF SYSTEMS:   Constitutional: Denies fevers, chills or abnormal weight loss Eyes: Denies blurriness of vision Ears, nose, mouth, throat, and face: Denies mucositis or sore throat Respiratory: Denies cough, dyspnea or wheezes Cardiovascular: Denies palpitation, chest discomfort or lower extremity swelling Gastrointestinal:  Denies nausea, heartburn or change in bowel habits Skin: Denies abnormal skin rashes Lymphatics: Denies new lymphadenopathy or easy bruising Neurological:Denies numbness, tingling or new weaknesses Behavioral/Psych: Mood is stable, no new changes  All other systems were reviewed with the patient and are negative.   VITALS:  There were no vitals taken for this visit.  Wt Readings from Last 3 Encounters:  11/26/23 169 lb (76.7 kg)  09/11/23 173 lb 1.6 oz (78.5 kg)  07/15/23 179 lb 14.3 oz (81.6 kg)    There is no height or weight on file to calculate BMI.  Performance status (ECOG): {CHL ONC [id number]  PHYSICAL EXAM:   GENERAL:alert, no distress and comfortable SKIN: skin color, texture, turgor are normal, no rashes or significant lesions EYES: normal, Conjunctiva are pink and non-injected, sclera clear OROPHARYNX:no exudate, no erythema and lips, buccal mucosa, and tongue normal  NECK: supple, thyroid normal size, non-tender, without nodularity LYMPH:  no palpable lymphadenopathy in the cervical, axillary or inguinal LUNGS: clear to auscultation and percussion with normal breathing effort HEART: regular rate & rhythm and no murmurs and no lower extremity edema ABDOMEN:abdomen soft, non-tender  and normal bowel sounds Musculoskeletal:no cyanosis of digits and no clubbing  NEURO: alert & oriented x 3 with fluent speech, no focal motor/sensory deficits  LABORATORY DATA:  I have reviewed the data as listed    Component Value Date/Time   NA 141 09/11/2023 1254   NA 140 04/24/2022 1419   K 3.8 09/11/2023 1254   CL 109 09/11/2023 1254   CO2 27 09/11/2023 1254   GLUCOSE 82 09/11/2023 1254   BUN 17 09/11/2023 1254   BUN 13 04/24/2022 1419   CREATININE 1.48 (H) 09/11/2023 1254   CREATININE 1.16 06/23/2015 1159   CALCIUM  9.3 09/11/2023 1254   PROT 7.2 09/11/2023 1254   ALBUMIN  4.0 09/11/2023 1254   AST 21 09/11/2023 1254   ALT 17 09/11/2023 1254   ALKPHOS 86 09/11/2023 1254   BILITOT 0.4 09/11/2023 1254   GFRNONAA 47 (L) 09/11/2023 1254   GFRAA >60 02/26/2017 1830    No results found for: SPEP, UPEP  Lab Results  Component Value Date   WBC 3.7 (L) 09/11/2023   NEUTROABS 1.9 09/11/2023   HGB 12.6 (L) 09/11/2023   HCT 37.4 (L) 09/11/2023   MCV 86.4 09/11/2023   PLT 122 (L) 09/11/2023      Chemistry      Component Value Date/Time   NA 141 09/11/2023 1254   NA 140 04/24/2022 1419   K 3.8 09/11/2023 1254   CL 109 09/11/2023 1254  CO2 27 09/11/2023 1254   BUN 17 09/11/2023 1254   BUN 13 04/24/2022 1419   CREATININE 1.48 (H) 09/11/2023 1254   CREATININE 1.16 06/23/2015 1159      Component Value Date/Time   CALCIUM  9.3 09/11/2023 1254   ALKPHOS 86 09/11/2023 1254   AST 21 09/11/2023 1254   ALT 17 09/11/2023 1254   BILITOT 0.4 09/11/2023 1254       RADIOGRAPHIC STUDIES: I have personally reviewed the radiological images as listed and agreed with the findings in the report. No results found.

## 2023-12-12 ENCOUNTER — Telehealth: Payer: Self-pay

## 2023-12-12 ENCOUNTER — Inpatient Hospital Stay

## 2023-12-12 ENCOUNTER — Inpatient Hospital Stay: Admitting: Nurse Practitioner

## 2023-12-12 ENCOUNTER — Other Ambulatory Visit: Payer: Self-pay | Admitting: Nurse Practitioner

## 2023-12-12 DIAGNOSIS — I2699 Other pulmonary embolism without acute cor pulmonale: Secondary | ICD-10-CM

## 2023-12-12 DIAGNOSIS — C184 Malignant neoplasm of transverse colon: Secondary | ICD-10-CM

## 2023-12-12 NOTE — Telephone Encounter (Signed)
 Attempted to contact the patient, per Powell Lessen.  Unable to reach the patient @T # [phone]. Unable to lvm due to the vmail not being setup. Contacted patient's daughter, Diane @T [phone]. Let Diane know that patient's 12/12/23 scheduled lab and office visit will be canceled. Explained to Diane that the patient needs to have the CT scan that was ordered in July 2025 and we will schedule follow-up office visit w/ labs to go over ct scan results and lab results. Diane voiced understanding and agreed to follow-up with us  once she has spoken to the patient.  Provided Diane w/ the T# to central [phone].

## 2024-03-03 ENCOUNTER — Encounter: Payer: Self-pay | Admitting: Internal Medicine
